# Patient Record
Sex: Female | Born: 1990
Health system: Southern US, Community
[De-identification: ages and names within clinical notes are randomized; demographics above are authoritative.]

## PROBLEM LIST (undated history)

## (undated) ENCOUNTER — Inpatient Hospital Stay (HOSPITAL_COMMUNITY): Payer: Self-pay

## (undated) DIAGNOSIS — E739 Lactose intolerance, unspecified: Secondary | ICD-10-CM

## (undated) DIAGNOSIS — O43129 Velamentous insertion of umbilical cord, unspecified trimester: Secondary | ICD-10-CM

## (undated) DIAGNOSIS — Z789 Other specified health status: Secondary | ICD-10-CM

## (undated) DIAGNOSIS — L309 Dermatitis, unspecified: Secondary | ICD-10-CM

## (undated) DIAGNOSIS — T7840XA Allergy, unspecified, initial encounter: Secondary | ICD-10-CM

## (undated) DIAGNOSIS — E559 Vitamin D deficiency, unspecified: Secondary | ICD-10-CM

## (undated) DIAGNOSIS — M549 Dorsalgia, unspecified: Secondary | ICD-10-CM

## (undated) DIAGNOSIS — A609 Anogenital herpesviral infection, unspecified: Secondary | ICD-10-CM

## (undated) DIAGNOSIS — E785 Hyperlipidemia, unspecified: Secondary | ICD-10-CM

## (undated) DIAGNOSIS — F419 Anxiety disorder, unspecified: Secondary | ICD-10-CM

## (undated) DIAGNOSIS — F32A Depression, unspecified: Secondary | ICD-10-CM

## (undated) HISTORY — DX: Anxiety disorder, unspecified: F41.9

## (undated) HISTORY — DX: Allergy, unspecified, initial encounter: T78.40XA

## (undated) HISTORY — DX: Dorsalgia, unspecified: M54.9

## (undated) HISTORY — DX: Hyperlipidemia, unspecified: E78.5

## (undated) HISTORY — DX: Vitamin D deficiency, unspecified: E55.9

## (undated) HISTORY — DX: Dermatitis, unspecified: L30.9

## (undated) HISTORY — DX: Lactose intolerance, unspecified: E73.9

## (undated) HISTORY — DX: Depression, unspecified: F32.A

## (undated) HISTORY — DX: Anogenital herpesviral infection, unspecified: A60.9

## (undated) HISTORY — PX: NO PAST SURGERIES: SHX2092

## (undated) HISTORY — DX: Velamentous insertion of umbilical cord, unspecified trimester: O43.129

---

## 2011-09-12 ENCOUNTER — Ambulatory Visit: Payer: Self-pay | Admitting: Internal Medicine

## 2011-09-12 VITALS — BP 110/75 | HR 80 | Temp 98.4°F | Resp 16 | Ht 65.5 in | Wt 190.4 lb

## 2011-09-12 DIAGNOSIS — A4902 Methicillin resistant Staphylococcus aureus infection, unspecified site: Secondary | ICD-10-CM

## 2011-09-12 MED ORDER — SULFAMETHOXAZOLE-TRIMETHOPRIM 800-160 MG PO TABS: 1.0000 | ORAL_TABLET | Freq: Two times a day (BID) | ORAL | Status: AC

## 2011-09-12 MED ORDER — DOXYCYCLINE HYCLATE 100 MG PO TABS: 100.0000 mg | ORAL_TABLET | Freq: Two times a day (BID) | ORAL | Status: AC

## 2011-09-13 NOTE — Progress Notes (Signed)
  Subjective:    Patient ID: Brooke Casey, female    DOB: [birth year], 21 y.o.   MRN: [id number]  HPIPresents with a sore swollen red area over the right shin which is developed over the past 2 weeks She has a history of MRSA in the past and has had recent active lesions in the pubic mound after shaving. There is one lesion near nail and a small swollen lymph node in the right groin She has had lesions which come and go steadily over the last 6 months    Review of Systems     Objective:   Physical Exam 7 cm x 4 cm abscess on the right anterior shin that is freely draining and copious pus is expressed Small 0.5 cm pustule right part of the pubic mound       Assessment & Plan:  Problem #1 MRSA Septra DS twice a day for 10 days Doxycycline 100 twice a day for 30 days Scrub lesions 3 times a day Hot compresses to right shin 3 times a day for 20 minutes Followup in 4 days if not making progress Culture not done because of past positives which showed susceptibility these drugs and because the patient has no insurance

## 2012-02-10 ENCOUNTER — Encounter (HOSPITAL_COMMUNITY): Payer: Self-pay | Admitting: Emergency Medicine

## 2012-02-10 ENCOUNTER — Emergency Department (INDEPENDENT_AMBULATORY_CARE_PROVIDER_SITE_OTHER)
Admission: EM | Admit: 2012-02-10 | Discharge: 2012-02-10 | Disposition: A | Discharge status: Home / Self Care | Payer: Self-pay | Source: Home / Self Care | Attending: Family Medicine | Admitting: Family Medicine

## 2012-02-10 DIAGNOSIS — L03019 Cellulitis of unspecified finger: Secondary | ICD-10-CM

## 2012-02-10 DIAGNOSIS — IMO0001 Reserved for inherently not codable concepts without codable children: Secondary | ICD-10-CM

## 2012-02-10 MED ORDER — DOXYCYCLINE HYCLATE 100 MG PO CAPS: 100.0000 mg | ORAL_CAPSULE | Freq: Two times a day (BID) | ORAL | Status: AC

## 2012-02-10 NOTE — ED Notes (Signed)
HERE WITH SWELLING AND LITTLE PUS DRAINAGE TO RIGHT MIDDLE NAILBED AREA AFTER GETTING NAILS DONE LAST WEEK.SOAKED IN PEROXIDE BUT C/O THROB PAIN IF BUMPED

## 2012-02-10 NOTE — ED Provider Notes (Signed)
History     CSN: [id number]  Arrival date & time 02/10/12  1839   First MD Initiated Contact with Patient 02/10/12 1901      Chief Complaint  Patient presents with  . Nail Problem    (Consider location/radiation/quality/duration/timing/severity/associated sxs/prior treatment) Patient is a 21 y.o. female presenting with hand pain. The history is provided by the patient.  Hand Pain This is a new problem. The current episode started more than 1 week ago. The problem has not changed since onset.Associated symptoms comments: Pain and cuticle sts since having nail treatment.    History reviewed. No pertinent past medical history.  History reviewed. No pertinent past surgical history.  No family history on file.  History  Substance Use Topics  . Smoking status: Never Smoker   . Smokeless tobacco: Not on file  . Alcohol Use: No    OB History    Grav Para Term Preterm Abortions TAB SAB Ect Mult Living                  Review of Systems  Constitutional: Negative.   Skin: Positive for wound.    Allergies  Review of patient's allergies indicates no known allergies.  Home Medications   Current Outpatient Rx  Name Route Sig Dispense Refill  . DOXYCYCLINE HYCLATE 100 MG PO CAPS Oral Take 1 capsule (100 mg total) by mouth 2 (two) times daily. 20 capsule 0    BP 126/72  Pulse 80  Temp 98.4 F (36.9 C) (Oral)  Resp 16  SpO2 100%  LMP 02/03/2012  Physical Exam  Nursing note and vitals reviewed. Constitutional: She appears well-developed and well-nourished.  Musculoskeletal: She exhibits tenderness.       Arms: Skin: Skin is warm and dry.    ED Course  Procedures (including critical care time)  Labs Reviewed - No data to display No results found.   1. Paronychia of third finger, right       MDM         Linna Hoff, MD 02/10/12 2008

## 2012-07-26 ENCOUNTER — Other Ambulatory Visit: Payer: Self-pay | Admitting: Radiology

## 2012-07-26 ENCOUNTER — Ambulatory Visit: Payer: Self-pay | Admitting: Physician Assistant

## 2012-07-26 VITALS — BP 101/68 | HR 85 | Temp 99.5°F | Resp 16 | Ht 65.0 in | Wt 184.0 lb

## 2012-07-26 DIAGNOSIS — J029 Acute pharyngitis, unspecified: Secondary | ICD-10-CM

## 2012-07-26 MED ORDER — AMOXICILLIN 875 MG PO TABS: 875.0000 mg | ORAL_TABLET | Freq: Two times a day (BID) | ORAL | Status: DC

## 2012-07-26 NOTE — Progress Notes (Signed)
  Subjective:    Patient ID: Brooke Casey, female    DOB: 10/06/1990, 22 y.o.   MRN: [id number]  HPI 22 year old female presents with 4 day history of progressively worsening sore throat.  Admits to slight dry cough.  States it is painful to swallow but she is able to do so.  Denies nasal congestion, otalgia, postnasal drainage, headache, dizziness, nausea, vomiting, or abdominal pain. She is otherwise healthy without any other concerns today. Does have a history of a similar illness last year about this time. No known strep contacts.  She has been taking ibuprofen which has helped some with the discomfort.     Review of Systems  Constitutional: Negative for fever and chills.  HENT: Positive for sore throat. Negative for congestion, rhinorrhea, trouble swallowing, neck pain, postnasal drip and sinus pressure.   Respiratory: Negative for cough.   Gastrointestinal: Negative for nausea and vomiting.  Neurological: Negative for dizziness, light-headedness and headaches.       Objective:   Physical Exam  Vitals reviewed. Constitutional: She is oriented to person, place, and time. She appears well-developed and well-nourished.  HENT:  Head: Normocephalic and atraumatic.  Right Ear: Hearing, tympanic membrane, external ear and ear canal normal.  Left Ear: Hearing, tympanic membrane, external ear and ear canal normal.  Mouth/Throat: Uvula is midline and mucous membranes are normal. Oropharyngeal exudate and posterior oropharyngeal erythema (3+ tonsillar swelling) present. No posterior oropharyngeal edema or tonsillar abscesses.  Eyes: Conjunctivae are normal.  Neck: Normal range of motion. Neck supple.  Cardiovascular: Normal rate, regular rhythm and normal heart sounds.   Pulmonary/Chest: Effort normal and breath sounds normal.  Lymphadenopathy:    Cervical adenopathy: AC.  Neurological: She is alert and oriented to person, place, and time.  Psychiatric: She has a normal mood and affect. Her  behavior is normal. Judgment and thought content normal.          Assessment & Plan:  Acute pharyngitis - Plan: amoxicillin (AMOXIL) 875 MG tablet  Will go ahead and cover strep with amoxicillin 875 mg bid x 10 days Patient understands that if symptoms persist, she may need to return for labwork to rule out possibility of mono Increase fluids and rest.  Continue ibuprofen tid prn pain/fever.

## 2012-07-26 NOTE — Telephone Encounter (Signed)
Spoke to pharmacy to advise per Rhoderick Moody, change Amox to 500mg  bid capsules, this will save patient large amt of money at [place].

## 2013-01-18 ENCOUNTER — Ambulatory Visit: Payer: Self-pay | Admitting: Physician Assistant

## 2013-01-18 VITALS — BP 108/66 | HR 120 | Temp 102.6°F | Resp 19 | Ht 65.5 in | Wt 192.0 lb

## 2013-01-18 DIAGNOSIS — J029 Acute pharyngitis, unspecified: Secondary | ICD-10-CM

## 2013-01-18 LAB — POCT RAPID STREP A (OFFICE): Rapid Strep A Screen: NEGATIVE

## 2013-01-18 MED ORDER — AMOXICILLIN 875 MG PO TABS: 875.0000 mg | ORAL_TABLET | Freq: Two times a day (BID) | ORAL | Status: DC

## 2013-01-18 MED ORDER — IBUPROFEN 200 MG PO TABS
800.0000 mg | ORAL_TABLET | Freq: Once | ORAL | Status: AC
  Administered 2013-01-18: 800 mg via ORAL

## 2013-01-18 NOTE — Progress Notes (Signed)
  Subjective:    Patient ID: Brooke Casey, female    DOB: [birth year], 22 y.o.   MRN: [id number]  HPI 22 year old female presents with acute onset of sore throat. Symptoms started 4 days ago and have progressively worsened.  Developed fever and body aches yesterday.  Has decreased appetite but no nausea or vomiting.  Admits to bilateral ear fullness and headache.  No dizziness, cough, nasal congestion, or sinus pressure.  She took ibuprofen yesterday but has not had any other medications since.  She is healthy with no known medical problems. No known strep contacts or significant hx of strep.   Patient is otherwise doing well with no other concerns today.     Review of Systems  Constitutional: Positive for fever and chills.  HENT: Positive for sore throat. Negative for ear pain, congestion, rhinorrhea and postnasal drip.   Respiratory: Negative for cough.   Gastrointestinal: Negative for nausea, vomiting and abdominal pain.  Neurological: Positive for headaches. Negative for dizziness.       Objective:   Physical Exam  Constitutional: She is oriented to person, place, and time. She appears well-developed and well-nourished.  HENT:  Head: Normocephalic and atraumatic.  Right Ear: Hearing, tympanic membrane, external ear and ear canal normal.  Left Ear: Hearing, tympanic membrane, external ear and ear canal normal.  Mouth/Throat: Uvula is midline and mucous membranes are normal. Oropharyngeal exudate (2+ tonsillar swelling) and posterior oropharyngeal erythema present. No posterior oropharyngeal edema or tonsillar abscesses.  Eyes: Conjunctivae are normal.  Neck: Normal range of motion.  Cardiovascular: Normal rate.   Pulmonary/Chest: Effort normal.  Neurological: She is alert and oriented to person, place, and time.  Psychiatric: She has a normal mood and affect. Her behavior is normal. Judgment and thought content normal.          Assessment & Plan:  Acute pharyngitis - Plan:  POCT rapid strep A  Patient declined throat culture and bloodwork due to cost Will go ahead and start amoxicillin 875 mg bid x 10 days Continue ibuprofen and tylenol as needed for fever and pain Increase fluids and rest Out of work until 01/20/13 Follow up if symptoms worsen or fail to improve.

## 2013-01-21 ENCOUNTER — Telehealth: Payer: Self-pay

## 2013-01-21 NOTE — Telephone Encounter (Signed)
Pt states that she seems to be getting worse, pt still c/o swollen tonsils, body aches, and fever that reached 104 yesterday but has went down today. [phone]

## 2013-01-22 NOTE — Telephone Encounter (Signed)
Patient advised to return to clinic. She should continue the ibuprofen.

## 2013-01-22 NOTE — Telephone Encounter (Signed)
Patient declined throat culture and bloodwork due to cost  Will go ahead and start amoxicillin 875 mg bid x 10 days  Continue ibuprofen and tylenol as needed for fever and pain  Increase fluids and rest  Out of work until 01/20/13  Follow up if symptoms worsen or fail to improve.   Pt should return to clinic for additional labs. Left message for her to call me back so I can advise.

## 2015-01-13 ENCOUNTER — Ambulatory Visit (INDEPENDENT_AMBULATORY_CARE_PROVIDER_SITE_OTHER): Payer: Self-pay | Admitting: Emergency Medicine

## 2015-01-13 ENCOUNTER — Ambulatory Visit (INDEPENDENT_AMBULATORY_CARE_PROVIDER_SITE_OTHER): Payer: Self-pay

## 2015-01-13 VITALS — BP 116/70 | HR 84 | Temp 98.5°F | Resp 16 | Ht 64.0 in | Wt 205.0 lb

## 2015-01-13 DIAGNOSIS — M7989 Other specified soft tissue disorders: Secondary | ICD-10-CM

## 2015-01-13 DIAGNOSIS — S61219A Laceration without foreign body of unspecified finger without damage to nail, initial encounter: Secondary | ICD-10-CM

## 2015-01-13 DIAGNOSIS — M79645 Pain in left finger(s): Secondary | ICD-10-CM

## 2015-01-13 DIAGNOSIS — S62639A Displaced fracture of distal phalanx of unspecified finger, initial encounter for closed fracture: Secondary | ICD-10-CM | POA: Insufficient documentation

## 2015-01-13 MED ORDER — NAPROXEN SODIUM 550 MG PO TABS: 550.0000 mg | ORAL_TABLET | Freq: Two times a day (BID) | ORAL | Status: DC

## 2015-01-13 MED ORDER — DOXYCYCLINE HYCLATE 100 MG PO CAPS: 100.0000 mg | ORAL_CAPSULE | Freq: Two times a day (BID) | ORAL | Status: DC

## 2015-01-13 NOTE — Patient Instructions (Signed)

## 2015-01-14 NOTE — Progress Notes (Signed)
  Medical screening examination/treatment/procedure(s) were performed by non-physician practitioner and as supervising physician I was immediately available for consultation/collaboration.     

## 2015-01-14 NOTE — Progress Notes (Signed)
    MRN: [id number] DOB: 24-Feb-1991  Subjective:   Brooke Casey is a 24 y.o. female presenting for chief complaint of wound care  Reports 10 day history of right middle finger laceration. Patient was on a cruise ship and suffered an injury with a steel door closing on her finger. She was treated with suture repair, started on amoxicillin, TDAP updated by medical team on the cruise ship. Today, she reports ongoing right middle finger pain and swelling, drainage of pus from her wound, numbness of her finger. 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 Denies any other aggravating or relieving factors, no other questions or concerns.  Brooke Casey is currently taking hydrocodone and amoxicillin. Also has No Known Allergies.  Brooke Casey  has no past medical history on file. Also  has no past surgical history on file.  Objective:   Vitals: BP 116/70 mmHg  Pulse 84  Temp(Src) 98.5 F (36.9 C) (Oral)  Resp 16  Ht  (1.626 m)  Wt 205 lb (92.987 kg)  BMI 35.17 kg/m2  SpO2 98%  LMP 12/18/2014  Physical Exam  Constitutional: She is oriented to person, place, and time. She appears well-developed and well-nourished.  Cardiovascular: Normal rate.   Pulmonary/Chest: Effort normal.  Musculoskeletal:       Right hand: She exhibits bony tenderness, laceration and swelling. She exhibits normal range of motion, normal capillary refill and no deformity. Decreased sensation noted. Decreased strength (seoncdary to pain per patient) noted. She exhibits no finger abduction, no thumb/finger opposition and no wrist extension trouble.       Hands: Neurological: She is alert and oriented to person, place, and time.  Skin: Skin is warm and dry.   [facility] reading (PRIMARY) by  Dr. Dareen Piano and PA-Keali Mccraw. Right middle finger - displaced distal phanlyx fracture.  PROCEDURE - Sutures removed without incident. Betadine used to clean wound. Dressing applied, distal phanlynx protected  with finger splint and secured with coband. Patient tolerated this well.  Assessment and Plan :   1. Finger laceration, initial encounter 2. Finger swelling 3. Pain of finger of left hand 4. Closed fracture of distal phalanx or phalanges of hand, initial encounter - Stop amoxicillin, start doxycycline for better coverage of infectious process. - Finger splint applied, limit use of right hand, start Anaprox for pain and inflammation and use hydrocodone for break through pain - rtc in 2 weeks.  Wallis Bamberg, PA-C [facility] [phone] 01/14/2015 1:07 PM

## 2015-08-04 ENCOUNTER — Ambulatory Visit (INDEPENDENT_AMBULATORY_CARE_PROVIDER_SITE_OTHER): Payer: BLUE CROSS/BLUE SHIELD | Admitting: Internal Medicine

## 2015-08-04 VITALS — BP 130/80 | HR 83 | Temp 99.5°F | Resp 20 | Ht 65.0 in | Wt 201.6 lb

## 2015-08-04 DIAGNOSIS — J029 Acute pharyngitis, unspecified: Secondary | ICD-10-CM

## 2015-08-04 LAB — POCT RAPID STREP A (OFFICE): RAPID STREP A SCREEN: NEGATIVE

## 2015-08-04 LAB — POCT CBC
Granulocyte percent: 69.6 %G (ref 37–80)
HEMATOCRIT: 35.6 % — AB (ref 37.7–47.9)
HEMOGLOBIN: 12.6 g/dL (ref 12.2–16.2)
LYMPH, POC: 1.6 (ref 0.6–3.4)
MCH, POC: 31.8 pg — AB (ref 27–31.2)
MCHC: 35.4 g/dL (ref 31.8–35.4)
MCV: 89.9 fL (ref 80–97)
MID (CBC): 0.5 (ref 0–0.9)
MPV: 6.8 fL (ref 0–99.8)
POC Granulocyte: 4.9 (ref 2–6.9)
POC LYMPH PERCENT: 22.7 %L (ref 10–50)
POC MID %: 7.7 % (ref 0–12)
Platelet Count, POC: 241 10*3/uL (ref 142–424)
RBC: 3.96 M/uL — AB (ref 4.04–5.48)
RDW, POC: 13.3 %
WBC: 7.1 10*3/uL (ref 4.6–10.2)

## 2015-08-04 MED ORDER — FLUOCINONIDE 0.05 % EX OINT: 1.0000 "application " | TOPICAL_OINTMENT | Freq: Two times a day (BID) | CUTANEOUS | Status: DC

## 2015-08-04 NOTE — Patient Instructions (Signed)

## 2015-08-04 NOTE — Progress Notes (Signed)
Subjective:  By signing my name below, I, Stann Oresung-Kai Tsai, attest that this documentation has been prepared under the direction and in the presence of Ellamae Siaobert Tunis Gentle, MD. Electronically Signed: Stann Oresung-Kai Tsai, Scribe. 08/04/2015 , 2:32 PM .  Patient was seen in Room 12 .   Patient ID: Sande Brothersaima Nolt, female    DOB: 01/14/1991, 25 y.o.   MRN: [id number] Chief Complaint  Patient presents with  . Allergic Reaction    lip swelling   HPI Kalayla Emelia LoronBlakely is a 25 y.o. female who [facility] of possible allergic reaction causing her bottom lip to swell that was noticed this morning. She reports that the bottom lip area is itchy and has some trouble swallowing. She denies tongue swelling.   She also notes that she started coughing with fever and fatigue 4 days ago. She denies any wheezing or waking up at night due to coughs. She recently took doxycycline leftover from previous illness.   Patient Active Problem List   Diagnosis Date Noted  . Closed fracture of distal phalanx or phalanges of hand 01/13/2015  . Pain of finger of left hand 01/13/2015  . Finger swelling 01/13/2015    Current outpatient prescriptions:  .  doxycycline (VIBRAMYCIN) 100 MG capsule, Take 1 capsule (100 mg total) by mouth 2 (two) times daily. (Patient not taking: Reported on 08/04/2015), Disp: 20 capsule, Rfl: 0 .  naproxen sodium (ANAPROX DS) 550 MG tablet, Take 1 tablet (550 mg total) by mouth 2 (two) times daily with a meal. (Patient not taking: Reported on 08/04/2015), Disp: 30 tablet, Rfl: 0  Review of Systems  Constitutional: Positive for fever and fatigue. Negative for activity change and appetite change.  HENT: Positive for facial swelling and trouble swallowing. Negative for congestion and sore throat.   Respiratory: Positive for cough. Negative for shortness of breath and wheezing.   Gastrointestinal: Negative for nausea, vomiting and diarrhea.  Skin: Positive for rash.       Objective:   Physical Exam  Constitutional: She is oriented to person, place, and time. She appears well-developed and well-nourished. No distress.  HENT:  Head: Normocephalic and atraumatic.  Nose: Nose normal.  Throat red with scant exudate Lips are mildly swollen with very fine raised red rash  Eyes: Conjunctivae and EOM are normal. Pupils are equal, round, and reactive to light.  Neck: Neck supple.  Cardiovascular: Normal rate.   Pulmonary/Chest: Effort normal and breath sounds normal. No respiratory distress. She has no wheezes.  Musculoskeletal: Normal range of motion.  Lymphadenopathy:    She has cervical adenopathy (anterior cervical 1+ and tender).  Neurological: She is alert and oriented to person, place, and time.  Skin: Skin is warm and dry.  Psychiatric: She has a normal mood and affect. Her behavior is normal.  Nursing note and vitals reviewed.   BP 130/80 mmHg  Pulse 83  Temp(Src) 99.5 F (37.5 C) (Oral)  Resp 20  Ht 5\' 5"  (1.651 m)  Wt 201 lb 9.6 oz (91.445 kg)  BMI 33.55 kg/m2  SpO2 98%  LMP 07/09/2015   Results for orders placed or performed in visit on 08/04/15  POCT rapid strep A  Result Value Ref Range   Rapid Strep A Screen Negative Negative       Assessment & Plan:  Sore throat - Plan: POCT rapid strep A, Culture, Group A Strep, POCT CBC lip rash Meds ordered this encounter  Medications  . fluocinonide ointment (LIDEX) 0.05 %    Sig:  Apply 1 application topically 2 (two) times daily.    Dispense:  15 g    Refill:  0   F/u 3-5d if not better Call cult I have completed the patient encounter in its entirety as documented by the scribe, with editing by me where necessary. Jeanmarie Mccowen P. Merla Riches, M.D.

## 2015-08-07 LAB — CULTURE, GROUP A STREP: ORGANISM ID, BACTERIA: NORMAL

## 2016-06-02 LAB — OB RESULTS CONSOLE GC/CHLAMYDIA
Chlamydia: NEGATIVE
GC PROBE AMP, GENITAL: NEGATIVE

## 2016-06-02 LAB — OB RESULTS CONSOLE RUBELLA ANTIBODY, IGM: Rubella: IMMUNE

## 2016-06-02 LAB — OB RESULTS CONSOLE ABO/RH: RH Type: POSITIVE

## 2016-06-02 LAB — OB RESULTS CONSOLE HIV ANTIBODY (ROUTINE TESTING): HIV: NONREACTIVE

## 2016-06-02 LAB — OB RESULTS CONSOLE RPR: RPR: NONREACTIVE

## 2016-06-02 LAB — OB RESULTS CONSOLE HEPATITIS B SURFACE ANTIGEN: Hepatitis B Surface Ag: NEGATIVE

## 2016-10-29 ENCOUNTER — Observation Stay (HOSPITAL_COMMUNITY)
Admission: AD | Admit: 2016-10-29 | Discharge: 2016-10-29 | Disposition: A | Discharge status: Home / Self Care | Payer: BLUE CROSS/BLUE SHIELD | Source: Ambulatory Visit | Attending: Obstetrics and Gynecology | Admitting: Obstetrics and Gynecology

## 2016-10-29 ENCOUNTER — Encounter (HOSPITAL_COMMUNITY): Payer: Self-pay | Admitting: *Deleted

## 2016-10-29 DIAGNOSIS — Z3A28 28 weeks gestation of pregnancy: Secondary | ICD-10-CM | POA: Insufficient documentation

## 2016-10-29 DIAGNOSIS — Z79899 Other long term (current) drug therapy: Secondary | ICD-10-CM | POA: Insufficient documentation

## 2016-10-29 DIAGNOSIS — R197 Diarrhea, unspecified: Secondary | ICD-10-CM | POA: Insufficient documentation

## 2016-10-29 DIAGNOSIS — O9989 Other specified diseases and conditions complicating pregnancy, childbirth and the puerperium: Principal | ICD-10-CM | POA: Insufficient documentation

## 2016-10-29 DIAGNOSIS — R509 Fever, unspecified: Secondary | ICD-10-CM | POA: Diagnosis present

## 2016-10-29 LAB — ABO/RH: ABO/RH(D): A POS

## 2016-10-29 LAB — URINALYSIS, ROUTINE W REFLEX MICROSCOPIC
BILIRUBIN URINE: NEGATIVE
GLUCOSE, UA: NEGATIVE mg/dL
Hgb urine dipstick: NEGATIVE
KETONES UR: 20 mg/dL — AB
Leukocytes, UA: NEGATIVE
NITRITE: NEGATIVE
PH: 6 (ref 5.0–8.0)
PROTEIN: NEGATIVE mg/dL
Specific Gravity, Urine: 1.015 (ref 1.005–1.030)

## 2016-10-29 LAB — CBC
HEMATOCRIT: 33.5 % — AB (ref 36.0–46.0)
Hemoglobin: 12.1 g/dL (ref 12.0–15.0)
MCH: 32.7 pg (ref 26.0–34.0)
MCHC: 36.1 g/dL — AB (ref 30.0–36.0)
MCV: 90.5 fL (ref 78.0–100.0)
PLATELETS: 255 10*3/uL (ref 150–400)
RBC: 3.7 MIL/uL — ABNORMAL LOW (ref 3.87–5.11)
RDW: 12.7 % (ref 11.5–15.5)
WBC: 11.2 10*3/uL — ABNORMAL HIGH (ref 4.0–10.5)

## 2016-10-29 LAB — COMPREHENSIVE METABOLIC PANEL
ALT: 26 U/L (ref 14–54)
ANION GAP: 8 (ref 5–15)
AST: 30 U/L (ref 15–41)
Albumin: 3 g/dL — ABNORMAL LOW (ref 3.5–5.0)
Alkaline Phosphatase: 80 U/L (ref 38–126)
BILIRUBIN TOTAL: 0.7 mg/dL (ref 0.3–1.2)
BUN: <5 mg/dL — ABNORMAL LOW (ref 6–20)
CHLORIDE: 104 mmol/L (ref 101–111)
CO2: 19 mmol/L — ABNORMAL LOW (ref 22–32)
Calcium: 8.9 mg/dL (ref 8.9–10.3)
Creatinine, Ser: 0.54 mg/dL (ref 0.44–1.00)
Glucose, Bld: 82 mg/dL (ref 65–99)
POTASSIUM: 3.8 mmol/L (ref 3.5–5.1)
Sodium: 131 mmol/L — ABNORMAL LOW (ref 135–145)
TOTAL PROTEIN: 7 g/dL (ref 6.5–8.1)

## 2016-10-29 LAB — TYPE AND SCREEN
ABO/RH(D): A POS
Antibody Screen: NEGATIVE

## 2016-10-29 LAB — INFLUENZA PANEL BY PCR (TYPE A & B)
Influenza A By PCR: NEGATIVE
Influenza B By PCR: NEGATIVE

## 2016-10-29 MED ORDER — OSELTAMIVIR PHOSPHATE 75 MG PO CAPS
75.0000 mg | ORAL_CAPSULE | Freq: Two times a day (BID) | ORAL | Status: DC
  Administered 2016-10-29: 75 mg via ORAL
  Filled 2016-10-29 (×3): qty 1

## 2016-10-29 MED ORDER — LACTATED RINGERS IV SOLN: INTRAVENOUS | Status: DC

## 2016-10-29 MED ORDER — ZOLPIDEM TARTRATE 5 MG PO TABS
5.0000 mg | ORAL_TABLET | Freq: Every evening | ORAL | Status: DC | PRN
Start: 2016-10-29 — End: 2016-10-29

## 2016-10-29 MED ORDER — PRENATAL MULTIVITAMIN CH
1.0000 | ORAL_TABLET | Freq: Every day | ORAL | Status: DC
  Administered 2016-10-29: 1 via ORAL
  Filled 2016-10-29 (×2): qty 1

## 2016-10-29 MED ORDER — ACETAMINOPHEN 500 MG PO TABS
1000.0000 mg | ORAL_TABLET | Freq: Once | ORAL | Status: AC
  Administered 2016-10-29: 1000 mg via ORAL
  Filled 2016-10-29: qty 2

## 2016-10-29 MED ORDER — LACTATED RINGERS IV SOLN
INTRAVENOUS | Status: DC
  Administered 2016-10-29: 100 mL via INTRAVENOUS

## 2016-10-29 MED ORDER — LOPERAMIDE HCL 2 MG PO CAPS
2.0000 mg | ORAL_CAPSULE | ORAL | Status: DC | PRN
  Filled 2016-10-29: qty 1

## 2016-10-29 MED ORDER — ACETAMINOPHEN 325 MG PO TABS: 650.0000 mg | ORAL_TABLET | ORAL | Status: DC | PRN

## 2016-10-29 MED ORDER — ACETAMINOPHEN 325 MG PO TABS: 650.0000 mg | ORAL_TABLET | Freq: Four times a day (QID) | ORAL | Status: DC | PRN

## 2016-10-29 MED ORDER — LOPERAMIDE HCL 2 MG PO CAPS
4.0000 mg | ORAL_CAPSULE | Freq: Once | ORAL | Status: AC
  Administered 2016-10-29: 4 mg via ORAL
  Filled 2016-10-29: qty 2

## 2016-10-29 MED ORDER — LACTATED RINGERS IV BOLUS (SEPSIS)
500.0000 mL | Freq: Once | INTRAVENOUS | Status: AC
  Administered 2016-10-29: 500 mL via INTRAVENOUS

## 2016-10-29 MED ORDER — DOCUSATE SODIUM 100 MG PO CAPS
100.0000 mg | ORAL_CAPSULE | Freq: Every day | ORAL | Status: DC
  Administered 2016-10-29: 100 mg via ORAL
  Filled 2016-10-29 (×2): qty 1

## 2016-10-29 MED ORDER — CALCIUM CARBONATE ANTACID 500 MG PO CHEW: 2.0000 | CHEWABLE_TABLET | ORAL | Status: DC | PRN

## 2016-10-29 NOTE — MAU Note (Signed)
PT  SAYS   THIS  AM  SHE  STARTED  H/A, FEVER- AT  HOME  103.9.  BODY ACHES,  DIARRHEA-     CALLED  OFFICE - TOLD  TO COME  IN.

## 2016-10-29 NOTE — H&P (Signed)
Brooke Casey is a 26 y.o. female, G1P0 at 5428 weeks, presenting for fever and diarrhea.  Patient pregnancy history significant for HSV diagnosis by titier, Obesity, and Velamentous/marginal cord insertion.  Patient also has shellfish allergy.    Patient Active Problem List   Diagnosis Date Noted  . Closed fracture of distal phalanx or phalanges of hand 01/13/2015  . Pain of finger of left hand 01/13/2015  . Finger swelling 01/13/2015    History of present pregnancy: Patient entered care at 6.5 weeks.   EDC of 01/21/2017 was established by Definite LMP.   Anatomy scan:  19.6 weeks, with normal findings and an posterior placenta.   Additional US evaluations:  27wks for Growth  Last evaluation:  10/27/2016 in office by [facility], CNM  Breech Presentation, BP 106/58, Wt 224lbs  OB History    Gravida Para Term Preterm AB Living   1             SAB TAB Ectopic Multiple Live Births                 History reviewed. No pertinent past medical history. History reviewed. No pertinent surgical history. Family History: family history is not on file. Social History:  reports that she has never smoked. She does not have any smokeless tobacco history on file. She reports that she does not drink alcohol or use drugs.   Prenatal Transfer Tool  Maternal Diabetes: No Genetic Screening: Normal Maternal Ultrasounds/Referrals: Abnormal:  Findings:   Other:Marginal and Velamentous Insertion Fetal Ultrasounds or other Referrals:  None Maternal Substance Abuse:  No Significant Maternal Medications:  None Significant Maternal Lab Results: None    ROS: Constitutional: Positive for chills, fever and malaise/fatigue.  HENT: Negative for congestion and sinus pain.   Respiratory: Negative for cough, sputum production and shortness of breath.   Gastrointestinal: Positive for diarrhea and nausea. Negative for abdominal pain and vomiting.  Musculoskeletal: Positive for myalgias.  Neurological: Positive for  weakness and headaches.    No Known Allergies     Blood pressure 113/69, pulse 99, temperature 98.9 F (37.2 C), temperature source Oral, resp. rate 18, height 5\' 4"  (1.626 m), weight 101.3 kg (223 lb 4 oz).  Physical Exam Constitutional: She is oriented to person, place, and time. She appears well-developed and well-nourished. No distress.  HENT:  Head: Normocephalic and atraumatic.  Eyes: Conjunctivae are normal.  Neck: Normal range of motion.  Cardiovascular: Normal rate, regular rhythm and normal heart sounds.   Respiratory: Effort normal and breath sounds normal.  GI: Soft. Bowel sounds are normal.  Gravid--fundal height appears AGA, Soft, NT  Musculoskeletal: Normal range of motion. She exhibits edema.  Neurological: She is alert and oriented to person, place, and time.  Skin: Skin is warm and dry.    FHR:150 bpm, Mod Var, -Decels, +Accels UC: None graphed or palpated   Prenatal labs: ABO, Rh: --/--/A POS, A POS (06/01 0130) Antibody: NEG (06/01 0130) Rubella:  Immune RPR:  NR  HBsAg:   Negative HIV:   NR GBS:  Unknown Sickle cell/Hgb electrophoresis:  Normal Pap:  Normal GC:  ND Chlamydia:  ND Other:  None    Assessment IUP at 28wks Cat I FT Fever of Unknown Origin Diarrhea  Plan: Admit to Antepartum for observation per consult with Dr.E. Sallye OberKulwa Routine Antepartum Orders per [facility] Guideline *Temperature Q 2 HRs *Blood Cultures *Stool Cultures *Start Tamiflu for suspected influenza despite negative labs Plan for discharge if labs normal and patient remains  afebrile Okay for regular diet  Joellyn Quails, MSN 10/29/2016, 3:07 AM

## 2016-10-29 NOTE — Progress Notes (Signed)
Hospital day # 0 pregnancy at 6736w0d--Fever, diarrhea.  S:  Feeling slightly better this am, "very tired".  Still had some diarrhea during night, but denies vomiting or nausea.  Felt like she had some chills during night.  Denies abdominal pain, other than cramping when diarrhea occurs.  Reports +FM.      Perception of contractions: None      Vaginal bleeding:  None      Vaginal discharge:  None  O: BP 113/64 (BP Location: Left Arm)   Pulse 94   Temp 98.7 F (37.1 C) (Oral)   Resp 18   Ht 5\' 4"  (1.626 m)   Wt 101.3 kg (223 lb 4 oz)   SpO2 95%   BMI 38.32 kg/m    Temp max 100.4 at time of admission.  Vitals:   10/29/16 0252 10/29/16 0353 10/29/16 0407 10/29/16 0549  BP: 113/69 113/64    Pulse: 99 94    Resp: 18 18    Temp: 98.9 F (37.2 C) 98.8 F (37.1 C)  98.7 F (37.1 C)  TempSrc: Oral Oral  Oral  SpO2:   95%   Weight:      Height:            Fetal tracings:  Reactive NST on admission      Contractions:   None       Chest clear      Abd soft, NT, + bowel sounds      Uterus gravid and non-tender      Extremities: no significant edema and no signs of DVT          Labs:   Results for orders placed or performed during the hospital encounter of 10/29/16 (from the past 24 hour(s))  Urinalysis, Routine w reflex microscopic     Status: Abnormal   Collection Time: 10/29/16 12:49 AM  Result Value Ref Range   Color, Urine YELLOW YELLOW   APPearance HAZY (A) CLEAR   Specific Gravity, Urine 1.015 1.005 - 1.030   pH 6.0 5.0 - 8.0   Glucose, UA NEGATIVE NEGATIVE mg/dL   Hgb urine dipstick NEGATIVE NEGATIVE   Bilirubin Urine NEGATIVE NEGATIVE   Ketones, ur 20 (A) NEGATIVE mg/dL   Protein, ur NEGATIVE NEGATIVE mg/dL   Nitrite NEGATIVE NEGATIVE   Leukocytes, UA NEGATIVE NEGATIVE  Influenza panel by PCR (type A & B)     Status: None   Collection Time: 10/29/16  1:25 AM  Result Value Ref Range   Influenza A By PCR NEGATIVE NEGATIVE   Influenza B By PCR NEGATIVE NEGATIVE   CBC     Status: Abnormal   Collection Time: 10/29/16  1:30 AM  Result Value Ref Range   WBC 11.2 (H) 4.0 - 10.5 K/uL   RBC 3.70 (L) 3.87 - 5.11 MIL/uL   Hemoglobin 12.1 12.0 - 15.0 g/dL   HCT 86.533.5 (L) 78.436.0 - 69.646.0 %   MCV 90.5 78.0 - 100.0 fL   MCH 32.7 26.0 - 34.0 pg   MCHC 36.1 (H) 30.0 - 36.0 g/dL   RDW 29.512.7 28.411.5 - 13.215.5 %   Platelets 255 150 - 400 K/uL  Comprehensive metabolic panel     Status: Abnormal   Collection Time: 10/29/16  1:30 AM  Result Value Ref Range   Sodium 131 (L) 135 - 145 mmol/L   Potassium 3.8 3.5 - 5.1 mmol/L   Chloride 104 101 - 111 mmol/L   CO2 19 (L) 22 - 32 mmol/L  Glucose, Bld 82 65 - 99 mg/dL   BUN <5 (L) 6 - 20 mg/dL   Creatinine, Ser 1.61 0.44 - 1.00 mg/dL   Calcium 8.9 8.9 - 09.6 mg/dL   Total Protein 7.0 6.5 - 8.1 g/dL   Albumin 3.0 (L) 3.5 - 5.0 g/dL   AST 30 15 - 41 U/L   ALT 26 14 - 54 U/L   Alkaline Phosphatase 80 38 - 126 U/L   Total Bilirubin 0.7 0.3 - 1.2 mg/dL   GFR calc non Af Amer >60 >60 mL/min   GFR calc Af Amer >60 >60 mL/min   Anion gap 8 5 - 15  Type and screen [facility]     Status: None   Collection Time: 10/29/16  1:30 AM  Result Value Ref Range   ABO/RH(D) A POS    Antibody Screen NEG    Sample Expiration 11/01/2016   ABO/Rh     Status: None   Collection Time: 10/29/16  1:30 AM  Result Value Ref Range   ABO/RH(D) A POS          Meds:  . docusate sodium  100 mg Oral Daily  . oseltamivir  75 mg Oral BID  . prenatal multivitamin  1 tablet Oral Q1200  Initial/only dose of Tylenol--650 mg at 0146  A: [redacted]w[redacted]d with fever, diarrhea--? Viral illness, flu testing negative.     Stable  P: Continue current plan of care      Hold any further Tylenol this am to observe for any recurrence of fever.      Advance to bland diet as tolerated.      Blood cultures and stool cultures pending.      Observe at present      Re-evaluate later today.      MDs will follow  [name] CNM, [place] 10/29/2016 7:31  AM

## 2016-10-29 NOTE — Progress Notes (Signed)
Pt requesting to go home, denies any further diarrhea since this morning,  Pt informed RN will talk with provider regarding request.  Pt verbalized understanding & agreeable.

## 2016-10-29 NOTE — Discharge Instructions (Signed)
OK TO USE IMODIUM FOR DIARRHEA ACCORDING TO PACKAGE INSTRUCTIONS.  CALL IF ANYTHING GETS WORSE!  Diarrhea, Adult Diarrhea is when you have loose and water poop (stool) often. Diarrhea can make you feel weak and cause you to get dehydrated. Dehydration can make you tired and thirsty, make you have a dry mouth, and make it so you pee (urinate) less often. Diarrhea often lasts 2-3 days. However, it can last longer if it is a sign of something more serious. It is important to treat your diarrhea as told by your doctor. Follow these instructions at home: Eating and drinking  Follow these recommendations as told by your doctor:  Take an oral rehydration solution (ORS). This is a drink that is sold at pharmacies and stores.  Drink clear fluids, such as: ? Water. ? Ice chips. ? Diluted fruit juice. ? Low-calorie sports drinks.  Eat bland, easy-to-digest foods in small amounts as you are able. These foods include: ? Bananas. ? Applesauce. ? Rice. ? Low-fat (lean) meats. ? Toast. ? Crackers.  Avoid drinking fluids that have a lot of sugar or caffeine in them.  Avoid alcohol.  Avoid spicy or fatty foods.  General instructions   Drink enough fluid to keep your pee (urine) clear or pale yellow.  Wash your hands often. If you cannot use soap and water, use hand sanitizer.  Make sure that all people in your home wash their hands well and often.  Take over-the-counter and prescription medicines only as told by your doctor.  Rest at home while you get better.  Watch your condition for any changes.  Take a warm bath to help with any burning or pain from having diarrhea.  Keep all follow-up visits as told by your doctor. This is important. Contact a doctor if:  You have a fever.  Your diarrhea gets worse.  You have new symptoms.  You cannot keep fluids down.  You feel light-headed or dizzy.  You have a headache.  You have muscle cramps. Get help right away if:  You have  chest pain.  You feel very weak or you pass out (faint).  You have bloody or black poop or poop that look like tar.  You have very bad pain, cramping, or bloating in your belly (abdomen).  You have trouble breathing or you are breathing very quickly.  Your heart is beating very quickly.  Your skin feels cold and clammy.  You feel confused.  You have signs of dehydration, such as: ? Dark pee, hardly any pee, or no pee. ? Cracked lips. ? Dry mouth. ? Sunken eyes. ? Sleepiness. ? Weakness. This information is not intended to replace advice given to you by your health care provider. Make sure you discuss any questions you have with your health care provider. Document Released: 11/03/2007 Document Revised: 12/05/2015 Document Reviewed: 01/21/2015 Elsevier Interactive Patient Education  2018 Elsevier Inc. Fetal Movement Counts Patient Name: ________________________________________________ Patient Due Date: ____________________ What is a fetal movement count? A fetal movement count is the number of times that you feel your baby move during a certain amount of time. This may also be called a fetal kick count. A fetal movement count is recommended for every pregnant woman. You may be asked to start counting fetal movements as early as week 28 of your pregnancy. Pay attention to when your baby is most active. You may notice your baby's sleep and wake cycles. You may also notice things that make your baby move more. You should  do a fetal movement count:  When your baby is normally most active.  At the same time each day.  A good time to count movements is while you are resting, after having something to eat and drink. How do I count fetal movements? 1. Find a quiet, comfortable area. Sit, or lie down on your side. 2. Write down the date, the start time and stop time, and the number of movements that you felt between those two times. Take this information with you to your health care  visits. 3. For 2 hours, count kicks, flutters, swishes, rolls, and jabs. You should feel at least 10 movements during 2 hours. 4. You may stop counting after you have felt 10 movements. 5. If you do not feel 10 movements in 2 hours, have something to eat and drink. Then, keep resting and counting for 1 hour. If you feel at least 4 movements during that hour, you may stop counting. Contact a health care provider if:  You feel fewer than 4 movements in 2 hours.  Your baby is not moving like he or she usually does. Date: ____________ Start time: ____________ Stop time: ____________ Movements: ____________ Date: ____________ Start time: ____________ Stop time: ____________ Movements: ____________ Date: ____________ Start time: ____________ Stop time: ____________ Movements: ____________ Date: ____________ Start time: ____________ Stop time: ____________ Movements: ____________ Date: ____________ Start time: ____________ Stop time: ____________ Movements: ____________ Date: ____________ Start time: ____________ Stop time: ____________ Movements: ____________ Date: ____________ Start time: ____________ Stop time: ____________ Movements: ____________ Date: ____________ Start time: ____________ Stop time: ____________ Movements: ____________ Date: ____________ Start time: ____________ Stop time: ____________ Movements: ____________ This information is not intended to replace advice given to you by your health care provider. Make sure you discuss any questions you have with your health care provider. Document Released: 06/16/2006 Document Revised: 01/14/2016 Document Reviewed: 06/26/2015 Elsevier Interactive Patient Education  [employer]2018 Elsevier Inc.

## 2016-10-29 NOTE — Progress Notes (Signed)
Per Infection Prevention, may remove droplet precautions due to negative flu testing.  Patient still with episodes of diarrhea. Will give Immodium 4 mg now, with 2 mg po prn after loose stools, up to total of 16 mg/day.  Continues to be afebrile.  FHR Category 1 on NST.  Vitals:   10/29/16 0407 10/29/16 0549 10/29/16 0758 10/29/16 0956  BP:   (!) 103/56   Pulse:   (!) 105   Resp:   20   Temp:  98.7 F (37.1 C) 99.1 F (37.3 C) 98.6 F (37 C)  TempSrc:  Oral Oral Oral  SpO2: 95%  98%   Weight:      Height:        Will continue to observe. Ok for Tylenol if needed for mild pain, fever.  Nigel BridgemanVicki Anabelle Bungert, CNM 10/29/16 1040

## 2016-10-29 NOTE — Discharge Summary (Signed)
Physician Discharge Summary  Patient ID: Brooke Casey MRN: [id number] DOB/AGE: [birth year] 25 y.o.  Admit date: 10/29/2016 Discharge date: 10/29/2016  Admission Diagnoses:  IUP at 28 weeks, fever of unknown origin, diarrhea  Discharge Diagnoses:  Active Problems:   Fever of undetermined origin   Discharged Condition: good  Hospital Course: Admitted for observation on 10/29/16 due to fever reported to 103 at home, with diarrhea.  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  Temp on admission was 100.4. 20 ketones noted on UA, all other labs WNL.  Given IV hydration, Tylenol with benefit.  Afebrile after single dose, no further temp during night or following morning.  Flu testing was negative, did receive single dose of Tamiflu.  Tolerating a regular diet without difficulty.  Occasional diarrhea still, given Imodium 4 mg dose at 1108, with no further diarrhea.  FHR remained Cat 1 on q shift NST.  She was d/c'd home on the afternoon of 6/1, with instructions for bland diet, Imodium if needed, to f/u with any worsening of sx, and to keep scheduled ROB appt on 09/08/16.  Blood cultures and stool cultures were pending at the time of d/c.  Consults: None  Significant Diagnostic Studies: labs:  Results for orders placed or performed during the hospital encounter of 10/29/16 (from the past 24 hour(s))  Urinalysis, Routine w reflex microscopic     Status: Abnormal   Collection Time: 10/29/16 12:49 AM  Result Value Ref Range   Color, Urine YELLOW YELLOW   APPearance HAZY (A) CLEAR   Specific Gravity, Urine 1.015 1.005 - 1.030   pH 6.0 5.0 - 8.0   Glucose, UA NEGATIVE NEGATIVE mg/dL   Hgb urine dipstick NEGATIVE NEGATIVE   Bilirubin Urine NEGATIVE NEGATIVE   Ketones, ur 20 (A) NEGATIVE mg/dL   Protein, ur NEGATIVE NEGATIVE mg/dL   Nitrite NEGATIVE NEGATIVE   Leukocytes, UA NEGATIVE NEGATIVE  Influenza panel by PCR (type A & B)     Status: None   Collection Time: 10/29/16  1:25 AM   Result Value Ref Range   Influenza A By PCR NEGATIVE NEGATIVE   Influenza B By PCR NEGATIVE NEGATIVE  CBC     Status: Abnormal   Collection Time: 10/29/16  1:30 AM  Result Value Ref Range   WBC 11.2 (H) 4.0 - 10.5 K/uL   RBC 3.70 (L) 3.87 - 5.11 MIL/uL   Hemoglobin 12.1 12.0 - 15.0 g/dL   HCT 40.9 (L) 81.1 - 91.4 %   MCV 90.5 78.0 - 100.0 fL   MCH 32.7 26.0 - 34.0 pg   MCHC 36.1 (H) 30.0 - 36.0 g/dL   RDW 78.2 95.6 - 21.3 %   Platelets 255 150 - 400 K/uL  Comprehensive metabolic panel     Status: Abnormal   Collection Time: 10/29/16  1:30 AM  Result Value Ref Range   Sodium 131 (L) 135 - 145 mmol/L   Potassium 3.8 3.5 - 5.1 mmol/L   Chloride 104 101 - 111 mmol/L   CO2 19 (L) 22 - 32 mmol/L   Glucose, Bld 82 65 - 99 mg/dL   BUN <5 (L) 6 - 20 mg/dL   Creatinine, Ser 0.86 0.44 - 1.00 mg/dL   Calcium 8.9 8.9 - 57.8 mg/dL   Total Protein 7.0 6.5 - 8.1 g/dL   Albumin 3.0 (L) 3.5 - 5.0 g/dL   AST 30 15 - 41 U/L   ALT 26 14 - 54 U/L   Alkaline Phosphatase 80 38 -  126 U/L   Total Bilirubin 0.7 0.3 - 1.2 mg/dL   GFR calc non Af Amer >60 >60 mL/min   GFR calc Af Amer >60 >60 mL/min   Anion gap 8 5 - 15  Type and screen [facility]     Status: None   Collection Time: 10/29/16  1:30 AM  Result Value Ref Range   ABO/RH(D) A POS    Antibody Screen NEG    Sample Expiration 11/01/2016   ABO/Rh     Status: None   Collection Time: 10/29/16  1:30 AM  Result Value Ref Range   ABO/RH(D) A POS     Treatments: IV hydration  Discharge Exam: Blood pressure (!) 96/52, pulse (!) 102, temperature 99.3 F (37.4 C), temperature source Oral, resp. rate 16, height 5\' 4"  (1.626 m), weight 101.3 kg (223 lb 4 oz), SpO2 98 %. General appearance: alert Resp: clear to auscultation bilaterally Cardio: regular rate and rhythm, S1, S2 normal, no murmur, click, rub or gallop GI: soft, non-tender; bowel sounds normal; no masses,  no organomegaly Pelvic: cervix normal in appearance,  external genitalia normal, no adnexal masses or tenderness, no cervical motion tenderness, rectovaginal septum normal, uterus normal size, shape, and consistency and vagina normal without discharge Extremities: extremities normal, atraumatic, no cyanosis or edema  Disposition: 01-Home or Self Care  Discharge Instructions    Discharge patient    Complete by:  As directed    Discharge disposition:  01-Home or Self Care   Discharge patient date:  10/29/2016   Discharge patient    Complete by:  As directed    Discharge disposition:  01-Home or Self Care   Discharge patient date:  10/29/2016     Allergies as of 10/29/2016   No Known Allergies     Medication List    TAKE these medications   prenatal multivitamin Tabs tablet Take 1 tablet by mouth daily at 12 noon.      Follow-up Information    [facility] on [facility]/08/2016.   Specialty:  Obstetrics and Gynecology Why:  Call for any questions or concerns. Contact information: [address] [place] [phone]          Signed: Nigel BridgemanLATHAM, Jubal Rademaker 10/29/2016, 1:51 PM

## 2016-10-29 NOTE — MAU Provider Note (Signed)
History     Patient Active Problem List   Diagnosis Date Noted  . Closed fracture of distal phalanx or phalanges of hand 01/13/2015  . Pain of finger of left hand 01/13/2015  . Finger swelling 01/13/2015    No chief complaint on file.  Brooke Casey is a 26y.o. G1P0 at 28wks who presents, after phone call, for fever.  Patient states that she took her temperature and it was 103.8 and 103.9 after repeating.  Patient also reports chills, HA, nausea, and myalgias as well as bouts of diarrhea after eating.  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  Patient has not taken anything OTC and states that all her symptoms started today.  Patient denies LOF, VB, and contractions while endorsing fetal movement.      OB History    No data available      No past medical history on file.  No past surgical history on file.  No family history on file.  Social History  Substance Use Topics  . Smoking status: Never Smoker  . Smokeless tobacco: Not on file  . Alcohol use No    Allergies: No Known Allergies  Prescriptions Prior to Admission  Medication Sig Dispense Refill Last Dose  . doxycycline (VIBRAMYCIN) 100 MG capsule Take 1 capsule (100 mg total) by mouth 2 (two) times daily. (Patient not taking: Reported on 08/04/2015) 20 capsule 0 Not Taking  . fluocinonide ointment (LIDEX) 0.05 % Apply 1 application topically 2 (two) times daily. 15 g 0   . naproxen sodium (ANAPROX DS) 550 MG tablet Take 1 tablet (550 mg total) by mouth 2 (two) times daily with a meal. (Patient not taking: Reported on 08/04/2015) 30 tablet 0 Not Taking    Review of Systems  Constitutional: Positive for chills, fever and malaise/fatigue.  HENT: Negative for congestion and sinus pain.   Respiratory: Negative for cough, sputum production and shortness of breath.   Gastrointestinal:  Positive for diarrhea and nausea. Negative for abdominal pain and vomiting.  Musculoskeletal: Positive for myalgias.  Neurological: Positive for weakness and headaches.    See HPI Above Physical Exam   Blood pressure 115/70, pulse 122, temperature 100.4 F (38 C), temperature source Oral, resp. rate 20 height 5\' 4"  (1.626 m), weight 101.3 kg (223 lb 4 oz).  Results for orders placed or performed during the hospital encounter of 10/29/16 (from the past 24 hour(s))  Urinalysis, Routine w reflex microscopic     Status: Abnormal   Collection Time: 10/29/16 12:49 AM  Result Value Ref Range   Color, Urine YELLOW YELLOW   APPearance HAZY (A) CLEAR   Specific Gravity, Urine 1.015 1.005 - 1.030   pH 6.0 5.0 - 8.0   Glucose, UA NEGATIVE NEGATIVE mg/dL   Hgb urine dipstick NEGATIVE NEGATIVE   Bilirubin Urine NEGATIVE NEGATIVE   Ketones, ur 20 (A) NEGATIVE mg/dL   Protein, ur NEGATIVE NEGATIVE mg/dL   Nitrite NEGATIVE NEGATIVE   Leukocytes, UA NEGATIVE NEGATIVE  Influenza panel by PCR (type A & B)     Status: None   Collection Time: 10/29/16  1:25 AM  Result Value Ref Range   Influenza A By PCR NEGATIVE NEGATIVE   Influenza B By PCR NEGATIVE NEGATIVE  CBC     Status: Abnormal   Collection Time: 10/29/16  1:30 AM  Result Value Ref Range  WBC 11.2 (H) 4.0 - 10.5 K/uL   RBC 3.70 (L) 3.87 - 5.11 MIL/uL   Hemoglobin 12.1 12.0 - 15.0 g/dL   HCT 96.033.5 (L) 45.436.0 - 09.846.0 %   MCV 90.5 78.0 - 100.0 fL   MCH 32.7 26.0 - 34.0 pg   MCHC 36.1 (H) 30.0 - 36.0 g/dL   RDW 11.912.7 14.711.5 - 82.915.5 %   Platelets 255 150 - 400 K/uL  Comprehensive metabolic panel     Status: Abnormal   Collection Time: 10/29/16  1:30 AM  Result Value Ref Range   Sodium 131 (L) 135 - 145 mmol/L   Potassium 3.8 3.5 - 5.1 mmol/L   Chloride 104 101 - 111 mmol/L   CO2 19 (L) 22 - 32 mmol/L   Glucose, Bld 82 65 - 99 mg/dL   BUN <5 (L) 6 - 20 mg/dL   Creatinine, Ser 5.620.54 0.44 - 1.00 mg/dL   Calcium 8.9 8.9 - 13.010.3 mg/dL   Total  Protein 7.0 6.5 - 8.1 g/dL   Albumin 3.0 (L) 3.5 - 5.0 g/dL   AST 30 15 - 41 U/L   ALT 26 14 - 54 U/L   Alkaline Phosphatase 80 38 - 126 U/L   Total Bilirubin 0.7 0.3 - 1.2 mg/dL   GFR calc non Af Amer >60 >60 mL/min   GFR calc Af Amer >60 >60 mL/min   Anion gap 8 5 - 15  Type and screen [facility] [place]     Status: None   Collection Time: 10/29/16  1:30 AM  Result Value Ref Range   ABO/RH(D) A POS    Antibody Screen NEG    Sample Expiration 11/01/2016   ABO/Rh     Status: None   Collection Time: 10/29/16  1:30 AM  Result Value Ref Range   ABO/RH(D) A POS     Physical Exam  Constitutional: She is oriented to person, place, and time. She appears well-developed and well-nourished. No distress.  HENT:  Head: Normocephalic and atraumatic.  Eyes: Conjunctivae are normal.  Neck: Normal range of motion.  Cardiovascular: Normal rate, regular rhythm and normal heart sounds.   Respiratory: Effort normal and breath sounds normal.  GI: Soft. Bowel sounds are normal.  Gravid--fundal height appears AGA, Soft, NT  Musculoskeletal: Normal range of motion. She exhibits edema.  Neurological: She is alert and oriented to person, place, and time.  Skin: Skin is warm and dry.     FHR:150 bpm, Mod Var, -Decels, +Accels UC: None graphed or palpated ED Course  Assessment: IUP at 28wks Cat I FT Fever Diarrhea   Plan: -PE as above -R/O Influenza -Labs: UA, CBC, CMP, INFLUENZA PANEL  Follow Up (0300) -Labs as above -Dr. Carmela HurtE. Kulwa consulted who advised: *Admit for observation *Temperature Q2 hrs as appropriate *Blood Cultures *Stool Cultures *Start Tamiflu for suspected influenza despite negative labs -Patient informed of POC and verbalizes understanding -No q/c -To Antepartum for observation  Cherre RobinsJessica L Iqra Rotundo CNM, MSN 10/29/2016 12:31 AM

## 2016-10-30 LAB — CULTURE, OB URINE

## 2016-11-03 LAB — OVA + PARASITE EXAM

## 2016-11-03 LAB — CULTURE, BLOOD (ROUTINE X 2)
Culture: NO GROWTH
Culture: NO GROWTH
SPECIAL REQUESTS: ADEQUATE
Special Requests: ADEQUATE

## 2016-11-03 LAB — O&P RESULT

## 2016-12-23 LAB — OB RESULTS CONSOLE GBS: GBS: NEGATIVE

## 2017-01-26 ENCOUNTER — Telehealth (HOSPITAL_COMMUNITY): Payer: Self-pay | Admitting: *Deleted

## 2017-01-26 ENCOUNTER — Encounter (HOSPITAL_COMMUNITY): Payer: Self-pay | Admitting: *Deleted

## 2017-01-26 ENCOUNTER — Other Ambulatory Visit: Payer: Self-pay | Admitting: Obstetrics and Gynecology

## 2017-01-26 NOTE — Telephone Encounter (Signed)
Preadmission screen  

## 2017-01-27 ENCOUNTER — Encounter (HOSPITAL_COMMUNITY): Payer: Self-pay

## 2017-01-27 ENCOUNTER — Inpatient Hospital Stay (HOSPITAL_COMMUNITY)
Admission: RE | Admit: 2017-01-27 | Discharge: 2017-01-31 | DRG: 765 | Disposition: A | Discharge status: Home / Self Care | Payer: BLUE CROSS/BLUE SHIELD | Source: Ambulatory Visit | Attending: Obstetrics and Gynecology | Admitting: Obstetrics and Gynecology

## 2017-01-27 DIAGNOSIS — O480 Post-term pregnancy: Secondary | ICD-10-CM | POA: Diagnosis present

## 2017-01-27 DIAGNOSIS — O3663X0 Maternal care for excessive fetal growth, third trimester, not applicable or unspecified: Secondary | ICD-10-CM | POA: Diagnosis present

## 2017-01-27 DIAGNOSIS — O9832 Other infections with a predominantly sexual mode of transmission complicating childbirth: Secondary | ICD-10-CM | POA: Diagnosis present

## 2017-01-27 DIAGNOSIS — Z98891 History of uterine scar from previous surgery: Secondary | ICD-10-CM

## 2017-01-27 DIAGNOSIS — O1404 Mild to moderate pre-eclampsia, complicating childbirth: Secondary | ICD-10-CM | POA: Diagnosis present

## 2017-01-27 DIAGNOSIS — Z6841 Body Mass Index (BMI) 40.0 and over, adult: Secondary | ICD-10-CM

## 2017-01-27 DIAGNOSIS — O43129 Velamentous insertion of umbilical cord, unspecified trimester: Secondary | ICD-10-CM | POA: Diagnosis present

## 2017-01-27 DIAGNOSIS — O43123 Velamentous insertion of umbilical cord, third trimester: Secondary | ICD-10-CM | POA: Diagnosis present

## 2017-01-27 DIAGNOSIS — O99891 Other specified diseases and conditions complicating pregnancy: Secondary | ICD-10-CM | POA: Diagnosis present

## 2017-01-27 DIAGNOSIS — A609 Anogenital herpesviral infection, unspecified: Secondary | ICD-10-CM | POA: Diagnosis not present

## 2017-01-27 DIAGNOSIS — A6000 Herpesviral infection of urogenital system, unspecified: Secondary | ICD-10-CM | POA: Diagnosis present

## 2017-01-27 DIAGNOSIS — O43199 Other malformation of placenta, unspecified trimester: Secondary | ICD-10-CM | POA: Diagnosis present

## 2017-01-27 DIAGNOSIS — Z3A40 40 weeks gestation of pregnancy: Secondary | ICD-10-CM

## 2017-01-27 DIAGNOSIS — O99214 Obesity complicating childbirth: Secondary | ICD-10-CM | POA: Diagnosis present

## 2017-01-27 DIAGNOSIS — O1400 Mild to moderate pre-eclampsia, unspecified trimester: Secondary | ICD-10-CM

## 2017-01-27 DIAGNOSIS — Z91013 Allergy to seafood: Secondary | ICD-10-CM | POA: Diagnosis present

## 2017-01-27 LAB — TYPE AND SCREEN
ABO/RH(D): A POS
ANTIBODY SCREEN: NEGATIVE

## 2017-01-27 LAB — CBC
HEMATOCRIT: 35.1 % — AB (ref 36.0–46.0)
HEMOGLOBIN: 12.5 g/dL (ref 12.0–15.0)
MCH: 31.6 pg (ref 26.0–34.0)
MCHC: 35.6 g/dL (ref 30.0–36.0)
MCV: 88.6 fL (ref 78.0–100.0)
Platelets: 257 10*3/uL (ref 150–400)
RBC: 3.96 MIL/uL (ref 3.87–5.11)
RDW: 13.4 % (ref 11.5–15.5)
WBC: 7.9 10*3/uL (ref 4.0–10.5)

## 2017-01-27 LAB — RPR: RPR Ser Ql: NONREACTIVE

## 2017-01-27 MED ORDER — FENTANYL CITRATE (PF) 100 MCG/2ML IJ SOLN
100.0000 ug | INTRAMUSCULAR | Status: DC | PRN
  Administered 2017-01-28 (×2): 100 ug via INTRAVENOUS
  Filled 2017-01-27 (×3): qty 2

## 2017-01-27 MED ORDER — ZOLPIDEM TARTRATE 5 MG PO TABS
5.0000 mg | ORAL_TABLET | Freq: Every evening | ORAL | Status: DC | PRN
Start: 2017-01-27 — End: 2017-01-28
  Administered 2017-01-27: 5 mg via ORAL
  Filled 2017-01-27: qty 1

## 2017-01-27 MED ORDER — OXYTOCIN 40 UNITS IN LACTATED RINGERS INFUSION - SIMPLE MED: 2.5000 [IU]/h | INTRAVENOUS | Status: DC

## 2017-01-27 MED ORDER — ACETAMINOPHEN 325 MG PO TABS
650.0000 mg | ORAL_TABLET | ORAL | Status: DC | PRN
Start: 2017-01-27 — End: 2017-01-28

## 2017-01-27 MED ORDER — FLEET ENEMA 7-19 GM/118ML RE ENEM: 1.0000 | ENEMA | RECTAL | Status: DC | PRN

## 2017-01-27 MED ORDER — OXYTOCIN 40 UNITS IN LACTATED RINGERS INFUSION - SIMPLE MED
1.0000 m[IU]/min | INTRAVENOUS | Status: DC
  Administered 2017-01-27: 1 m[IU]/min via INTRAVENOUS
  Administered 2017-01-28: 14 m[IU]/min via INTRAVENOUS
  Administered 2017-01-28: 12 m[IU]/min via INTRAVENOUS
  Filled 2017-01-27 (×2): qty 1000

## 2017-01-27 MED ORDER — MISOPROSTOL 25 MCG QUARTER TABLET
25.0000 ug | ORAL_TABLET | ORAL | Status: DC | PRN
  Administered 2017-01-27 (×2): 25 ug via VAGINAL
  Filled 2017-01-27 (×2): qty 1

## 2017-01-27 MED ORDER — OXYCODONE-ACETAMINOPHEN 5-325 MG PO TABS: 1.0000 | ORAL_TABLET | ORAL | Status: DC | PRN

## 2017-01-27 MED ORDER — OXYTOCIN BOLUS FROM INFUSION: 500.0000 mL | Freq: Once | INTRAVENOUS | Status: DC

## 2017-01-27 MED ORDER — FENTANYL CITRATE (PF) 100 MCG/2ML IJ SOLN
50.0000 ug | Freq: Once | INTRAMUSCULAR | Status: AC
  Administered 2017-01-27: 50 ug via INTRAVENOUS

## 2017-01-27 MED ORDER — SOD CITRATE-CITRIC ACID 500-334 MG/5ML PO SOLN
30.0000 mL | ORAL | Status: DC | PRN
  Administered 2017-01-28: 30 mL via ORAL
  Filled 2017-01-27: qty 15

## 2017-01-27 MED ORDER — LACTATED RINGERS IV SOLN
500.0000 mL | INTRAVENOUS | Status: DC | PRN
  Administered 2017-01-27: 500 mL via INTRAVENOUS
  Administered 2017-01-28: 300 mL via INTRAVENOUS
  Administered 2017-01-28: 500 mL via INTRAVENOUS

## 2017-01-27 MED ORDER — LIDOCAINE HCL (PF) 1 % IJ SOLN: 30.0000 mL | INTRAMUSCULAR | Status: DC | PRN

## 2017-01-27 MED ORDER — ONDANSETRON HCL 4 MG/2ML IJ SOLN: 4.0000 mg | Freq: Four times a day (QID) | INTRAMUSCULAR | Status: DC | PRN

## 2017-01-27 MED ORDER — OXYCODONE-ACETAMINOPHEN 5-325 MG PO TABS: 2.0000 | ORAL_TABLET | ORAL | Status: DC | PRN

## 2017-01-27 MED ORDER — LACTATED RINGERS IV SOLN
INTRAVENOUS | Status: DC
  Administered 2017-01-27: 20:00:00 via INTRAVENOUS
  Administered 2017-01-27: 125 mL/h via INTRAVENOUS
  Administered 2017-01-28 (×4): via INTRAVENOUS

## 2017-01-27 MED ORDER — TERBUTALINE SULFATE 1 MG/ML IJ SOLN: 0.2500 mg | Freq: Once | INTRAMUSCULAR | Status: DC | PRN

## 2017-01-27 NOTE — Progress Notes (Addendum)
Brooke Casey MRN: [id number]  Subjective: -Care assumed of 26 y.o. G1P0 at 3047w6d who presents for IOL s/t Post Dates.  Patient is under the care of [facility].  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   In room to meet acquaintance of patient and family.  Reports some perception of contractions, but mostly fetal movement.  No pain at current.   Objective: BP 112/71 (BP Location: Left Arm)   Pulse 89   Temp 98.2 F (36.8 C) (Oral)   Resp 16  No intake/output data recorded. No intake/output data recorded.  Fetal Monitoring: FHT: 150 bpm, Mod Var, -Decels, +Accels UC: Q861min, palpates mild    Physical Exam: General appearance: alert, well appearing, and in no distress. Chest: normal rate and regular rhythm.  clear to auscultation, no wheezes, rales or rhonchi, symmetric air entry. Abdominal exam: Gravid-Appears LGA, Soft RT, NT, BS x 4. GU: Sterile Speculum Exam: -Labia: Appears slightly inflamed.  Cyst noted near urethra area on left side. -Vaginal Vault: Appears inflamed, thick white discharge noted at posterior fornix -wet prep collected -Cervix: Appears slightly open ~1.5cm and think.  Able to bring forward with aggressive coughing from patient.  Cooks catheter inserted w/o difficulty. Vaginal gel applied to vaginal walls to promote patient comfort -Bimanual Exam: Deferred  Extremities: Edema Skin exam: Warm Dry  Vaginal Exam: SVE:   Dilation: 1 Effacement (%): 50 Station: -3 Exam by:: Nigel BridgemanVicki Latham, CNM at 1700 Membranes:Intact Internal Monitors: None  Augmentation/Induction: Pitocin:105mUn/min Cytotec: S/P 2 Doses Foley Bulb inserted via speculum with 60mL infused into uterine balloon and 40mL in vaginal balloon.  Patient tolerated well s/p dosage of fentanyl.   Assessment:  IUP at 40.6wks Cat I FT  PostDates IOL  Bishop Score: 4 per VL Exam at  1700   Plan: --Discussed foley bulb insertion including r/b, placement, associated discomfort, initiation of pitocin, and removal. -Patient nervous, but agrees to placement. Offered and accepts IV fentanyl 50mcg prior to procedure. -Encouraged to rest, requests sleep aide.  Ambian ordered -Informed of availability of epidural and IV medication for pain medication as needed -No other questions or concerns -Titrate pitocin, as appropriate, to no higher than 538mUn/min -Continue other mgmt as ordered  [name],MSN, CNM 01/27/2017, 7:20 PM   Addendum (2105) Foley bulb out  VE: Dilation: 5 Effacement (%): 50 Cervical Position: Posterior Station: -3 Presentation: Vertex Exam by:: [name], CNM   -Discussed AROM r/b including increased risk of infection, cord prolapse, fetal intolerance, and decreased labor time. No questions or concerns and patient desires to proceed with AROM.  -Fetal station too high and patient very uncomfortable with exam.  Will wait until presenting part lower and patient with proper pain management.  -Discussed epidural placement vs rest.  Patient informed next VE will not occur until at least 4 hours or if necessary before -Continue other mgmt as ordered  Cherre RobinsJessica L Poet Hineman MSN, CNM 01/27/2017 9:14 PM

## 2017-01-27 NOTE — Progress Notes (Signed)
  Subjective: Not aware of any UCs.  Has been sleeping at intervals.  Partner at bedside.  Objective: BP (!) 110/58 (BP Location: Left Arm)   Pulse 96   Temp 98.9 F (37.2 C) (Oral)   Resp 16  No intake/output data recorded. No intake/output data recorded.  FHT: Category 1 UC:   irregular, every 7-9 minutes SVE:   Dilation: 1 Effacement (%): Thick Station: -3 Exam by:: Nigel BridgemanVicki Saysha Menta, CNM  Cervix still very posterior 2nd dose Cytotech placed in posterior fornix.  Assessment:  Induction for postdates, velamentous insertion. Unfavorable cervix GBS negative  Plan: Continue current care Reassess in 4 hours.  Nigel BridgemanLATHAM, Yuritzi Kamp CNM 01/27/2017, 1245

## 2017-01-27 NOTE — Progress Notes (Addendum)
  Subjective: Doing well, not aware of UCs.  Sitting on couch.  Objective: BP 135/75 (BP Location: Left Arm)   Pulse 100   Temp 98.9 F (37.2 C) (Oral)   Resp 16  No intake/output data recorded. No intake/output data recorded.   Vitals:   01/27/17 1239 01/27/17 1240 01/27/17 1500 01/27/17 1705  BP:  (!) 110/58 135/75 129/83  Pulse:  96 100 93  Resp:  16 16 16   Temp: 98.9 F (37.2 C)     TempSrc: Oral       FHT: Category 1 UC:   regular, every 6-7 minutes SVE:   Dilation: 1 Effacement (%): 50 Station: -3 Exam by:: Nigel BridgemanVicki Judiann Celia, CNM  Cervix still very posterior, exam very painful for patient. Cervix soft, external os 1 cm, internal os 2 cm. 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 May consider foley bulb as cervix moves more anterior. Patient plans epidural as labor advances.  Nigel BridgemanLATHAM, Jevon Shells CNM 01/27/2017, 5:09 PM

## 2017-01-27 NOTE — H&P (Signed)
Brooke Casey is a 26 y.o. female, G1P0 at 5140 6/7 weeks, presenting for induction due to postdates and velamentous insertion, with a dilated umbilical vein noted on 3rd trimester US.  Induction was recommended at 40 weeks, but patient desired to wait until postdates for induction.  Hx of HSV 1&2 positive by titer in 2017, but no recent or current lesions, has been on Valtrex suppression since 36 weeks.  Cervix was 2-3 cm, 30%, vtx, -3 on exam by Dr. Stefano GaulStringer 01/20/17.  Patient Active Problem List   Diagnosis Date Noted  . HSV (herpes simplex virus) anogenital infection 01/27/2017  . Velamentous insertion of umbilical cord 01/27/2017  . Pregnancy complicated by umbilical cord varix in antepartum period 01/27/2017  . BMI 40.0-44.9, adult (HCC) 01/27/2017  . Marginal insertion of umbilical cord affecting management of mother 01/27/2017  . Post-dates pregnancy 01/27/2017  . Shellfish allergy 01/27/2017    History of present pregnancy: Patient entered care at 6-7  weeks.   EDC of 01/21/17 was established by LMP and in agreement with US at 12 4/7 weeks.   Anatomy scan:  19 6/7 weeks, with velamentous and marginal insertion of cord, anterior placenta, normal anatomy and growth.   Additional US evaluations:  Followed growth q 4 weeks. 27 5/7 weeks:  Breech, fluid and growth normal 31 4/7 weeks:  EFW 58%ile, dilated intra-abdominal vein, BPP 8/8, normal fluid, vtx. 32 6/7 weeks--BPP 8/8, AFI WNL, umbilical vein measurement stable Weekly BPPs since then, all WNL. 34 6/7 weeks:  EFW 5+13, 58%ile, normal fluid, vtx, umbilical vein stable 38 6/7 weeks:  EFW 7+6, 44%ile, normal fluid, umbilical vein stable, vtx. Significant prenatal events:  Positive urine culture for E coli at NOB, treated, still positive, but negative culture 10/29/16.  Declined flu vaccine.  Normal genetic testing.  Followed for growth and BPPs due to velamentous insertion and umbilical vein dilation.  Admitted at 28 weeks for fever,  viral sx, all cultures negative.  Induction recommended at 40 weeks due to velamentous insertion, but patient declined until post-dates. Last evaluation:  01/20/17--cervix 2-3, 30%, vtx, -3, BP 108/64, 243 lbs.  BPP 8/8, AFI WNL.  OB History    Gravida Para Term Preterm AB Living   1             SAB TAB Ectopic Multiple Live Births                 Past Medical History:  Diagnosis Date  . HSV (herpes simplex virus) anogenital infection   . Velamentous insertion of umbilical cord    Past Surgical History:  Procedure Laterality Date  . NO PAST SURGERIES     Family History: No significant family history  Social History:  reports that she has never smoked. She has never used smokeless tobacco. She reports that she does not drink alcohol or use drugs.  Patient is African-American, of the [place] faith, has some college, employed as Leisure centre managerbartender.  Partner, Pola Cornmile Kimbeni, is involved and supportive.   Prenatal Transfer Tool  Maternal Diabetes: No Genetic Screening: Normal 1st trimester screen and AFP Maternal Ultrasounds/Referrals: Abnormal:  Findings:   Other:Velamentous insertion, marginal insertion, dilated umbilical vein. Fetal Ultrasounds or other Referrals:  None Maternal Substance Abuse:  No Significant Maternal Medications:  None Significant Maternal Lab Results: Lab values include: Group B Strep negative  TDAP 11/23/16 Flu Declined  ROS:  Fetal movement, occasional UCs  Allergies  Allergen Reactions  . Shellfish Allergy      Dilation: 1  Effacement (%): Thick Station: -3 Exam by:: Doloris Hall RN Blood pressure 113/78, pulse 96, temperature 98.5 F (36.9 C), temperature source Oral, resp. rate 16.  Chest clear Heart RRR without murmur Abd gravid, NT, FH 39 cm Pelvic: Cervix very posterior, 1 cm, thick, soft, vtx, -3.  Exam difficult for patient. No HSV lesions noted on speculum exam.  Small vaginal inclusion cyst noted on anterior vag wall, NT,  non-draining. Ext: WNL  FHR: Category 1 UCs:  Occasional, mild  Prenatal labs: ABO, Rh: --/--/A POS, A POS (06/01 0130) Antibody: NEG (06/01 0130) Rubella:  Immune RPR: Nonreactive (01/03 0000)  HBsAg: Negative (01/03 0000)  HIV: Non-reactive (01/03 0000)  GBS: Negative (07/26 0000) Sickle cell/Hgb electrophoresis:  AA Pap:  04/2016 WNL GC:  Neg 06/02/16 Chlamydia:  Neg 06/02/16 Genetic screenings:  Normal 1st trimester screen and AFP Glucola:  WNL Other:   Hgb 12.9 at NOB, 12.6 at 28 weeks       Assessment/Plan: IUP at 40 6/7 weeks Velamentous insertion Marginal insertion GBS negative Umbilical vein dilation BMI 41 HSV 1 and 2 by titer, no recent or current outbreaks Shellfish allergy  Plan: Admit to Birthing Suite per consult with Dr. Su Hilt Routine [facility] orders Pain med/epidural prn Risks and benefits of induction were reviewed, including failure of method, prolonged labor, need for further intervention, risk of cesarean.  Patient and partner seem to understand these risks and wish to proceed. Options of cytotech, foley bulb, AROM, and pitocin reviewed, with use of each discussed. Cytotech placed in posterior fornix at 8:30a--consider foley as cervix comes more forward. Patient plans inpatient circumcision for baby boy, "[place]".  [name] CNM, [place] 01/27/2017, 8:44 AM

## 2017-01-27 NOTE — Anesthesia Pain Management Evaluation Note (Signed)
  CRNA Pain Management Visit Note  Patient: Brooke Casey, 26 y.o., female  "Hello I am a member of the anesthesia team at [facility]. We have an anesthesia team available at all times to provide care throughout the hospital, including epidural management and anesthesia for C-section. I don't know your plan for the delivery whether it a natural birth, water birth, IV sedation, nitrous supplementation, doula or epidural, but we want to meet your pain goals."   1.Was your pain managed to your expectations on prior hospitalizations?   No prior hospitalizations  2.What is your expectation for pain management during this hospitalization?     Epidural  3.How can we help you reach that goal? Epidural a pain goal.  Record the patient's initial score and the patient's pain goal.   Pain: 5  Pain Goal: 8 [facility] wants you to be able to say your pain was always managed very well.  Brooke Casey 01/27/2017

## 2017-01-28 ENCOUNTER — Encounter (HOSPITAL_COMMUNITY)
Admission: RE | Disposition: A | Discharge status: Home / Self Care | Payer: Self-pay | Source: Ambulatory Visit | Attending: Obstetrics and Gynecology

## 2017-01-28 ENCOUNTER — Encounter (HOSPITAL_COMMUNITY): Payer: Self-pay

## 2017-01-28 ENCOUNTER — Inpatient Hospital Stay (HOSPITAL_COMMUNITY): Payer: BLUE CROSS/BLUE SHIELD | Admitting: Anesthesiology

## 2017-01-28 LAB — CBC
HEMATOCRIT: 36.8 % (ref 36.0–46.0)
Hemoglobin: 12.9 g/dL (ref 12.0–15.0)
MCH: 31.5 pg (ref 26.0–34.0)
MCHC: 35.1 g/dL (ref 30.0–36.0)
MCV: 90 fL (ref 78.0–100.0)
Platelets: 213 10*3/uL (ref 150–400)
RBC: 4.09 MIL/uL (ref 3.87–5.11)
RDW: 13.5 % (ref 11.5–15.5)
WBC: 9.3 10*3/uL (ref 4.0–10.5)

## 2017-01-28 LAB — COMPREHENSIVE METABOLIC PANEL
ALBUMIN: 2.7 g/dL — AB (ref 3.5–5.0)
ALK PHOS: 195 U/L — AB (ref 38–126)
ALT: 16 U/L (ref 14–54)
ANION GAP: 9 (ref 5–15)
AST: 19 U/L (ref 15–41)
BUN: 7 mg/dL (ref 6–20)
CALCIUM: 8.9 mg/dL (ref 8.9–10.3)
CO2: 23 mmol/L (ref 22–32)
Chloride: 106 mmol/L (ref 101–111)
Creatinine, Ser: 0.56 mg/dL (ref 0.44–1.00)
GFR calc Af Amer: >60 mL/min (ref >60)
GFR calc non Af Amer: >60 mL/min (ref >60)
GLUCOSE: 82 mg/dL (ref 65–99)
Potassium: 3.6 mmol/L (ref 3.5–5.1)
SODIUM: 138 mmol/L (ref 135–145)
Total Bilirubin: 0.5 mg/dL (ref 0.3–1.2)
Total Protein: 7 g/dL (ref 6.5–8.1)

## 2017-01-28 LAB — PROTEIN / CREATININE RATIO, URINE
CREATININE, URINE: 31 mg/dL
PROTEIN CREATININE RATIO: 0.52 mg/mg{creat} — AB (ref 0.00–0.15)
Total Protein, Urine: 16 mg/dL

## 2017-01-28 LAB — URIC ACID: Uric Acid, Serum: 6.5 mg/dL (ref 2.3–6.6)

## 2017-01-28 LAB — LACTATE DEHYDROGENASE: LDH: 120 U/L (ref 98–192)

## 2017-01-28 SURGERY — Surgical Case
Anesthesia: Epidural | Site: Abdomen | Wound class: Clean Contaminated

## 2017-01-28 MED ORDER — NALBUPHINE HCL 10 MG/ML IJ SOLN: 5.0000 mg | Freq: Once | INTRAMUSCULAR | Status: DC | PRN

## 2017-01-28 MED ORDER — PHENYLEPHRINE 40 MCG/ML (10ML) SYRINGE FOR IV PUSH (FOR BLOOD PRESSURE SUPPORT): 80.0000 ug | PREFILLED_SYRINGE | INTRAVENOUS | Status: DC | PRN

## 2017-01-28 MED ORDER — SODIUM CHLORIDE 0.9 % IR SOLN
Status: DC | PRN
  Administered 2017-01-28: 350 mL

## 2017-01-28 MED ORDER — MEPERIDINE HCL 25 MG/ML IJ SOLN: 6.2500 mg | INTRAMUSCULAR | Status: DC | PRN

## 2017-01-28 MED ORDER — FENTANYL 2.5 MCG/ML BUPIVACAINE 1/10 % EPIDURAL INFUSION (WH - ANES)
14.0000 mL/h | INTRAMUSCULAR | Status: DC | PRN
  Administered 2017-01-28 (×3): 14 mL/h via EPIDURAL
  Filled 2017-01-28: qty 100

## 2017-01-28 MED ORDER — DEXTROSE 5 % IV SOLN
500.0000 mg | Freq: Once | INTRAVENOUS | Status: AC
  Administered 2017-01-28: 500 mg via INTRAVENOUS
  Filled 2017-01-28: qty 500

## 2017-01-28 MED ORDER — CEFAZOLIN SODIUM-DEXTROSE 2-4 GM/100ML-% IV SOLN
INTRAVENOUS | Status: AC
  Filled 2017-01-28: qty 100

## 2017-01-28 MED ORDER — EPHEDRINE 5 MG/ML INJ: 10.0000 mg | INTRAVENOUS | Status: DC | PRN

## 2017-01-28 MED ORDER — PRENATAL MULTIVITAMIN CH
1.0000 | ORAL_TABLET | Freq: Every day | ORAL | Status: DC
  Administered 2017-01-29 – 2017-01-30 (×2): 1 via ORAL
  Filled 2017-01-28 (×2): qty 1

## 2017-01-28 MED ORDER — STERILE WATER FOR IRRIGATION IR SOLN
Status: DC | PRN
  Administered 2017-01-28: 1000 mL

## 2017-01-28 MED ORDER — SODIUM BICARBONATE 8.4 % IV SOLN
INTRAVENOUS | Status: DC | PRN
  Administered 2017-01-28 (×3): 5 mL via EPIDURAL

## 2017-01-28 MED ORDER — DIPHENHYDRAMINE HCL 25 MG PO CAPS: 25.0000 mg | ORAL_CAPSULE | Freq: Four times a day (QID) | ORAL | Status: DC | PRN

## 2017-01-28 MED ORDER — PHENYLEPHRINE 40 MCG/ML (10ML) SYRINGE FOR IV PUSH (FOR BLOOD PRESSURE SUPPORT)
PREFILLED_SYRINGE | INTRAVENOUS | Status: AC
  Filled 2017-01-28: qty 20

## 2017-01-28 MED ORDER — SIMETHICONE 80 MG PO CHEW: 80.0000 mg | CHEWABLE_TABLET | ORAL | Status: DC | PRN

## 2017-01-28 MED ORDER — ZOLPIDEM TARTRATE 5 MG PO TABS: 5.0000 mg | ORAL_TABLET | Freq: Every evening | ORAL | Status: DC | PRN

## 2017-01-28 MED ORDER — FENTANYL 2.5 MCG/ML BUPIVACAINE 1/10 % EPIDURAL INFUSION (WH - ANES)
INTRAMUSCULAR | Status: AC
  Filled 2017-01-28: qty 100

## 2017-01-28 MED ORDER — SODIUM BICARBONATE 8.4 % IV SOLN
INTRAVENOUS | Status: AC
  Filled 2017-01-28: qty 50

## 2017-01-28 MED ORDER — OXYCODONE HCL 5 MG PO TABS
10.0000 mg | ORAL_TABLET | ORAL | Status: DC | PRN
  Administered 2017-01-29 – 2017-01-31 (×7): 10 mg via ORAL
  Filled 2017-01-28 (×7): qty 2

## 2017-01-28 MED ORDER — DIPHENHYDRAMINE HCL 50 MG/ML IJ SOLN: 12.5000 mg | INTRAMUSCULAR | Status: DC | PRN

## 2017-01-28 MED ORDER — IBUPROFEN 600 MG PO TABS: 600.0000 mg | ORAL_TABLET | Freq: Four times a day (QID) | ORAL | Status: DC

## 2017-01-28 MED ORDER — LACTATED RINGERS IV SOLN
INTRAVENOUS | Status: DC | PRN
  Administered 2017-01-28: 19:00:00 via INTRAVENOUS

## 2017-01-28 MED ORDER — COCONUT OIL OIL: 1.0000 "application " | TOPICAL_OIL | Status: DC | PRN

## 2017-01-28 MED ORDER — SODIUM CHLORIDE 0.9% FLUSH
3.0000 mL | INTRAVENOUS | Status: DC | PRN
Start: 2017-01-28 — End: 2017-01-31

## 2017-01-28 MED ORDER — LACTATED RINGERS IV SOLN
500.0000 mL | Freq: Once | INTRAVENOUS | Status: AC
  Administered 2017-01-28: 500 mL via INTRAVENOUS

## 2017-01-28 MED ORDER — ONDANSETRON HCL 4 MG/2ML IJ SOLN
INTRAMUSCULAR | Status: AC
  Filled 2017-01-28: qty 2

## 2017-01-28 MED ORDER — LACTATED RINGERS IV SOLN
INTRAVENOUS | Status: DC | PRN
  Administered 2017-01-28: 40 [IU] via INTRAVENOUS

## 2017-01-28 MED ORDER — WITCH HAZEL-GLYCERIN EX PADS: 1.0000 "application " | MEDICATED_PAD | CUTANEOUS | Status: DC | PRN

## 2017-01-28 MED ORDER — KETOROLAC TROMETHAMINE 30 MG/ML IJ SOLN
30.0000 mg | Freq: Once | INTRAMUSCULAR | Status: DC | PRN
  Administered 2017-01-28: 30 mg via INTRAVENOUS

## 2017-01-28 MED ORDER — MORPHINE SULFATE (PF) 0.5 MG/ML IJ SOLN
INTRAMUSCULAR | Status: DC | PRN
  Administered 2017-01-28: 4 mg via EPIDURAL

## 2017-01-28 MED ORDER — LACTATED RINGERS IV SOLN
INTRAVENOUS | Status: DC
  Administered 2017-01-28: 11:00:00 via INTRAUTERINE

## 2017-01-28 MED ORDER — SENNOSIDES-DOCUSATE SODIUM 8.6-50 MG PO TABS
2.0000 | ORAL_TABLET | ORAL | Status: DC
  Administered 2017-01-30 (×2): 2 via ORAL
  Filled 2017-01-28 (×3): qty 2

## 2017-01-28 MED ORDER — ACETAMINOPHEN 325 MG PO TABS
650.0000 mg | ORAL_TABLET | ORAL | Status: DC | PRN
  Administered 2017-01-29 – 2017-01-30 (×4): 650 mg via ORAL
  Filled 2017-01-28 (×5): qty 2

## 2017-01-28 MED ORDER — LIDOCAINE HCL (PF) 1 % IJ SOLN
INTRAMUSCULAR | Status: DC | PRN
  Administered 2017-01-28: 5 mL via EPIDURAL
  Administered 2017-01-28: 5 mL

## 2017-01-28 MED ORDER — NALBUPHINE HCL 10 MG/ML IJ SOLN: 5.0000 mg | INTRAMUSCULAR | Status: DC | PRN

## 2017-01-28 MED ORDER — DIPHENHYDRAMINE HCL 25 MG PO CAPS: 25.0000 mg | ORAL_CAPSULE | ORAL | Status: DC | PRN

## 2017-01-28 MED ORDER — SCOPOLAMINE 1 MG/3DAYS TD PT72
MEDICATED_PATCH | TRANSDERMAL | Status: DC | PRN
  Administered 2017-01-28: 1 via TRANSDERMAL

## 2017-01-28 MED ORDER — OXYTOCIN 40 UNITS IN LACTATED RINGERS INFUSION - SIMPLE MED: 2.5000 [IU]/h | INTRAVENOUS | Status: AC

## 2017-01-28 MED ORDER — LACTATED RINGERS IV SOLN
INTRAVENOUS | Status: DC | PRN
Start: 2017-01-28 — End: 2017-01-28
  Administered 2017-01-28 (×3): via INTRAVENOUS

## 2017-01-28 MED ORDER — SCOPOLAMINE 1 MG/3DAYS TD PT72
MEDICATED_PATCH | TRANSDERMAL | Status: AC
  Filled 2017-01-28: qty 1

## 2017-01-28 MED ORDER — CEFAZOLIN SODIUM-DEXTROSE 2-4 GM/100ML-% IV SOLN
2.0000 g | Freq: Once | INTRAVENOUS | Status: AC
  Administered 2017-01-28: 2 g via INTRAVENOUS

## 2017-01-28 MED ORDER — IBUPROFEN 600 MG PO TABS
600.0000 mg | ORAL_TABLET | Freq: Four times a day (QID) | ORAL | Status: DC
  Administered 2017-01-29 – 2017-01-31 (×9): 600 mg via ORAL
  Filled 2017-01-28 (×9): qty 1

## 2017-01-28 MED ORDER — MENTHOL 3 MG MT LOZG: 1.0000 | LOZENGE | OROMUCOSAL | Status: DC | PRN

## 2017-01-28 MED ORDER — OXYCODONE HCL 5 MG PO TABS
5.0000 mg | ORAL_TABLET | ORAL | Status: DC | PRN
  Administered 2017-01-29 (×2): 5 mg via ORAL
  Filled 2017-01-28 (×2): qty 1

## 2017-01-28 MED ORDER — LIDOCAINE-EPINEPHRINE (PF) 2 %-1:200000 IJ SOLN
INTRAMUSCULAR | Status: AC
  Filled 2017-01-28: qty 20

## 2017-01-28 MED ORDER — SIMETHICONE 80 MG PO CHEW
80.0000 mg | CHEWABLE_TABLET | ORAL | Status: DC
  Administered 2017-01-29 – 2017-01-30 (×3): 80 mg via ORAL
  Filled 2017-01-28 (×3): qty 1

## 2017-01-28 MED ORDER — SCOPOLAMINE 1 MG/3DAYS TD PT72
1.0000 | MEDICATED_PATCH | Freq: Once | TRANSDERMAL | Status: DC
  Filled 2017-01-28: qty 1

## 2017-01-28 MED ORDER — NALOXONE HCL 0.4 MG/ML IJ SOLN: 0.4000 mg | INTRAMUSCULAR | Status: DC | PRN

## 2017-01-28 MED ORDER — ONDANSETRON HCL 4 MG/2ML IJ SOLN
INTRAMUSCULAR | Status: DC | PRN
  Administered 2017-01-28: 4 mg via INTRAVENOUS

## 2017-01-28 MED ORDER — OXYTOCIN 10 UNIT/ML IJ SOLN
INTRAMUSCULAR | Status: AC
  Filled 2017-01-28: qty 4

## 2017-01-28 MED ORDER — TETANUS-DIPHTH-ACELL PERTUSSIS 5-2.5-18.5 LF-MCG/0.5 IM SUSP: 0.5000 mL | Freq: Once | INTRAMUSCULAR | Status: DC

## 2017-01-28 MED ORDER — KETOROLAC TROMETHAMINE 30 MG/ML IJ SOLN
INTRAMUSCULAR | Status: AC
  Administered 2017-01-28: 30 mg via INTRAVENOUS
  Filled 2017-01-28: qty 1

## 2017-01-28 MED ORDER — MORPHINE SULFATE (PF) 0.5 MG/ML IJ SOLN
INTRAMUSCULAR | Status: AC
  Filled 2017-01-28: qty 10

## 2017-01-28 MED ORDER — HYDROMORPHONE HCL 1 MG/ML IJ SOLN: 0.2500 mg | INTRAMUSCULAR | Status: DC | PRN

## 2017-01-28 MED ORDER — LACTATED RINGERS IV SOLN
INTRAVENOUS | Status: DC
  Administered 2017-01-29 (×2): via INTRAVENOUS

## 2017-01-28 MED ORDER — DIBUCAINE 1 % RE OINT: 1.0000 "application " | TOPICAL_OINTMENT | RECTAL | Status: DC | PRN

## 2017-01-28 MED ORDER — PROMETHAZINE HCL 25 MG/ML IJ SOLN: 6.2500 mg | INTRAMUSCULAR | Status: DC | PRN

## 2017-01-28 MED ORDER — SIMETHICONE 80 MG PO CHEW
80.0000 mg | CHEWABLE_TABLET | Freq: Three times a day (TID) | ORAL | Status: DC
  Administered 2017-01-29 – 2017-01-31 (×7): 80 mg via ORAL
  Filled 2017-01-28 (×7): qty 1

## 2017-01-28 MED ORDER — NALOXONE HCL 2 MG/2ML IJ SOSY: 1.0000 ug/kg/h | PREFILLED_SYRINGE | INTRAVENOUS | Status: DC | PRN

## 2017-01-28 MED ORDER — ONDANSETRON HCL 4 MG/2ML IJ SOLN: 4.0000 mg | Freq: Three times a day (TID) | INTRAMUSCULAR | Status: DC | PRN

## 2017-01-28 SURGICAL SUPPLY — 47 items
BARRIER ADHS 3X4 INTERCEED (GAUZE/BANDAGES/DRESSINGS) ×3 IMPLANT
BENZOIN TINCTURE PRP APPL 2/3 (GAUZE/BANDAGES/DRESSINGS) ×3 IMPLANT
CHLORAPREP W/TINT 26ML (MISCELLANEOUS) ×3 IMPLANT
CLAMP CORD UMBIL (MISCELLANEOUS) IMPLANT
CLOSURE STERI-STRIP 1/2X4 (GAUZE/BANDAGES/DRESSINGS) ×1
CLOSURE WOUND 1/2 X4 (GAUZE/BANDAGES/DRESSINGS) ×1
CLOTH BEACON ORANGE TIMEOUT ST (SAFETY) ×3 IMPLANT
CLSR STERI-STRIP ANTIMIC 1/2X4 (GAUZE/BANDAGES/DRESSINGS) ×2 IMPLANT
DERMABOND ADVANCED (GAUZE/BANDAGES/DRESSINGS)
DERMABOND ADVANCED .7 DNX12 (GAUZE/BANDAGES/DRESSINGS) IMPLANT
DRSG OPSITE POSTOP 4X10 (GAUZE/BANDAGES/DRESSINGS) ×3 IMPLANT
ELECT REM PT RETURN 9FT ADLT (ELECTROSURGICAL) ×3
ELECTRODE REM PT RTRN 9FT ADLT (ELECTROSURGICAL) ×1 IMPLANT
EXTRACTOR VACUUM KIWI (MISCELLANEOUS) IMPLANT
GLOVE BIOGEL PI IND STRL 6.5 (GLOVE) ×1 IMPLANT
GLOVE BIOGEL PI IND STRL 7.0 (GLOVE) ×2 IMPLANT
GLOVE BIOGEL PI INDICATOR 6.5 (GLOVE) ×2
GLOVE BIOGEL PI INDICATOR 7.0 (GLOVE) ×4
GLOVE ECLIPSE 6.5 STRL STRAW (GLOVE) ×3 IMPLANT
GOWN STRL REUS W/TWL LRG LVL3 (GOWN DISPOSABLE) ×9 IMPLANT
HOVERMATT SINGLE USE (MISCELLANEOUS) ×3 IMPLANT
KIT ABG SYR 3ML LUER SLIP (SYRINGE) IMPLANT
NEEDLE HYPO 25X5/8 SAFETYGLIDE (NEEDLE) IMPLANT
NS IRRIG 1000ML POUR BTL (IV SOLUTION) ×3 IMPLANT
PACK C SECTION WH (CUSTOM PROCEDURE TRAY) ×3 IMPLANT
PAD ABD 7.5X8 STRL (GAUZE/BANDAGES/DRESSINGS) ×3 IMPLANT
PAD ABD DERMACEA PRESS 5X9 (GAUZE/BANDAGES/DRESSINGS) ×3 IMPLANT
PAD OB MATERNITY 4.3X12.25 (PERSONAL CARE ITEMS) ×3 IMPLANT
PENCIL SMOKE EVAC W/HOLSTER (ELECTROSURGICAL) ×3 IMPLANT
RETRACTOR TRAXI PANNICULUS (MISCELLANEOUS) ×1 IMPLANT
RTRCTR C-SECT PINK 25CM LRG (MISCELLANEOUS) ×3 IMPLANT
STRIP CLOSURE SKIN 1/2X4 (GAUZE/BANDAGES/DRESSINGS) ×2 IMPLANT
SUT PLAIN 0 NONE (SUTURE) IMPLANT
SUT PLAIN 2 0 (SUTURE) ×2
SUT PLAIN 2 0 XLH (SUTURE) IMPLANT
SUT PLAIN ABS 2-0 CT1 27XMFL (SUTURE) ×1 IMPLANT
SUT VIC AB 0 CT1 27 (SUTURE) ×4
SUT VIC AB 0 CT1 27XBRD ANBCTR (SUTURE) ×2 IMPLANT
SUT VIC AB 0 CTX 36 (SUTURE) ×6
SUT VIC AB 0 CTX36XBRD ANBCTRL (SUTURE) ×3 IMPLANT
SUT VIC AB 2-0 CT1 27 (SUTURE) ×2
SUT VIC AB 2-0 CT1 TAPERPNT 27 (SUTURE) ×1 IMPLANT
SUT VIC AB 4-0 KS 27 (SUTURE) ×3 IMPLANT
TAPE CLOTH SURG 4X10 WHT LF (GAUZE/BANDAGES/DRESSINGS) ×3 IMPLANT
TOWEL OR 17X24 6PK STRL BLUE (TOWEL DISPOSABLE) ×3 IMPLANT
TRAXI PANNICULUS RETRACTOR (MISCELLANEOUS) ×2
TRAY FOLEY BAG SILVER LF 14FR (SET/KITS/TRAYS/PACK) IMPLANT

## 2017-01-28 NOTE — Anesthesia Postprocedure Evaluation (Signed)
Anesthesia Post Note  Patient: Brooke Casey  Procedure(s) Performed: Procedure(s) (LRB): CESAREAN SECTION (N/A)     Patient location during evaluation: Mother Baby Anesthesia Type: Epidural Level of consciousness: awake and alert and oriented Pain management: satisfactory to patient Vital Signs Assessment: post-procedure vital signs reviewed and stable Respiratory status: respiratory function stable Cardiovascular status: stable Postop Assessment: no headache, no backache, epidural receding, patient able to bend at knees, no signs of nausea or vomiting and adequate PO intake Anesthetic complications: no    Last Vitals:  Vitals:   01/28/17 2115 01/28/17 2129  BP: 127/73 115/64  Pulse: 92 88  Resp: 17 18  Temp: 37.7 C 37 C  SpO2: 98% 97%    Last Pain:  Vitals:   01/28/17 2130  TempSrc:   PainSc: 1    Pain Goal:                 Xaiden Fleig

## 2017-01-28 NOTE — Progress Notes (Signed)
  Subjective: Comfortable with epidural--aware of some pressure in vagina/rectum, but not uncomfortable.  Objective: BP 128/87   Pulse 94   Temp 98.6 F (37 C) (Oral)   Resp 18   SpO2 100%  No intake/output data recorded. No intake/output data recorded.   Vitals:   01/28/17 0850 01/28/17 0855 01/28/17 0900 01/28/17 0930  BP: 120/81 116/79 119/83 128/87  Pulse: 93 91 91 94  Resp:   18   Temp:      TempSrc:      SpO2:          FHT: Category 2--occasional late decels, occasional variables, positive scalp stim. UC:  Irregular, q 2-4 min. SVE:   Dilation: 5 Effacement (%): 80 Station: -2 Exam by:: Manfred ArchV. Derwood Becraft CNM  BBOW, vtx well-applied to cervix. AROM--clear fluid, moderate amount Pitocin at 14 mu/min IUPC and FSE inserted easily. Foley cath placed without difficulty.  Assessment:  Induction for post-dates, velamentous insertion.  Plan: Decrease pitocin to 7 mu/min. Close observation of FHR status. Continue augmentation to establish/maintain adequacy. PCR from foley--if positive, will check PIH labs.  Brooke Casey, Brooke Casey CNM 01/28/2017, 9:40 AM

## 2017-01-28 NOTE — Progress Notes (Signed)
  Subjective: Comfortable with epidural.  Family at bedside.  Objective: BP 121/61   Pulse 79   Temp 98 F (36.7 C) (Oral)   Resp 18   SpO2 100%  No intake/output data recorded. Total I/O In: -  Out: 2875 [Urine:2875]   Vitals:   01/28/17 1430 01/28/17 1500 01/28/17 1530 01/28/17 1600  BP: 122/69 114/65 118/68 121/61  Pulse: 80 82 83 79  Resp:  18  18  Temp:    98 F (36.7 C)  TempSrc:    Oral  SpO2:        FHT: Category 1 UC:   regular, every 2-3 minutes SVE:   Dilation: 6.5 Effacement (%): 90 Station: -1 Exam by:: Manfred ArchV. Natayah Warmack CNM  Cervix tight around os, but stretchable. Pitocin at 14 mu/min MVUs 110-120--IUPC adjusted and re-zeroed.  Results for orders placed or performed during the hospital encounter of 01/27/17 (from the past 24 hour(s))  Protein / creatinine ratio, urine     Status: Abnormal   Collection Time: 01/28/17  9:45 AM  Result Value Ref Range   Creatinine, Urine 31.00 mg/dL   Total Protein, Urine 16 mg/dL   Protein Creatinine Ratio 0.52 (H) 0.00 - 0.15 mg/mg[Cre]  Comprehensive metabolic panel     Status: Abnormal   Collection Time: 01/28/17  2:00 PM  Result Value Ref Range   Sodium 138 135 - 145 mmol/L   Potassium 3.6 3.5 - 5.1 mmol/L   Chloride 106 101 - 111 mmol/L   CO2 23 22 - 32 mmol/L   Glucose, Bld 82 65 - 99 mg/dL   BUN 7 6 - 20 mg/dL   Creatinine, Ser 4.090.56 0.44 - 1.00 mg/dL   Calcium 8.9 8.9 - 81.110.3 mg/dL   Total Protein 7.0 6.5 - 8.1 g/dL   Albumin 2.7 (L) 3.5 - 5.0 g/dL   AST 19 15 - 41 U/L   ALT 16 14 - 54 U/L   Alkaline Phosphatase 195 (H) 38 - 126 U/L   Total Bilirubin 0.5 0.3 - 1.2 mg/dL   GFR calc non Af Amer >60 >60 mL/min   GFR calc Af Amer >60 >60 mL/min   Anion gap 9 5 - 15  Uric acid     Status: None   Collection Time: 01/28/17  2:00 PM  Result Value Ref Range   Uric Acid, Serum 6.5 2.3 - 6.6 mg/dL  Lactate dehydrogenase     Status: None   Collection Time: 01/28/17  2:00 PM  Result Value Ref Range   LDH 120 98 -  192 U/L  CBC     Status: None   Collection Time: 01/28/17  2:00 PM  Result Value Ref Range   WBC 9.3 4.0 - 10.5 K/uL   RBC 4.09 3.87 - 5.11 MIL/uL   Hemoglobin 12.9 12.0 - 15.0 g/dL   HCT 91.436.8 78.236.0 - 95.646.0 %   MCV 90.0 78.0 - 100.0 fL   MCH 31.5 26.0 - 34.0 pg   MCHC 35.1 30.0 - 36.0 g/dL   RDW 21.313.5 08.611.5 - 57.815.5 %   Platelets 213 150 - 400 K/uL    Assessment:  Induction for postdates and velamentous insertion Active labor Inadequate UCs Possible LGA per abdominal palpation Elevated PCR--mild pre-eclampsia  Plan: Continue augmentation to establish/maintain adequacy.  Nigel BridgemanLATHAM, Noya Santarelli CNM 01/28/2017, 4:31 PM

## 2017-01-28 NOTE — Anesthesia Preprocedure Evaluation (Addendum)
Anesthesia Evaluation  Patient identified by MRN, date of birth, ID band Patient awake    Reviewed: Allergy & Precautions, H&P , NPO status , Patient's Chart, lab work & pertinent test results  History of Anesthesia Complications (+) history of anesthetic complications  Airway Mallampati: II  TM Distance: >3 FB Neck ROM: Full    Dental no notable dental hx. (+) Teeth Intact   Pulmonary neg pulmonary ROS,    Pulmonary exam normal breath sounds clear to auscultation       Cardiovascular negative cardio ROS Normal cardiovascular exam Rhythm:Regular Rate:Normal     Neuro/Psych negative neurological ROS  negative psych ROS   GI/Hepatic negative GI ROS, Neg liver ROS,   Endo/Other  negative endocrine ROS  Renal/GU negative Renal ROS  negative genitourinary   Musculoskeletal negative musculoskeletal ROS (+)   Abdominal   Peds negative pediatric ROS (+)  Hematology negative hematology ROS (+)   Anesthesia Other Findings   Reproductive/Obstetrics negative OB ROS (+) Pregnancy                             Anesthesia Physical Anesthesia Plan  ASA: II  Anesthesia Plan: Epidural   Post-op Pain Management:    Induction: Intravenous  PONV Risk Score and Plan: 2 and Ondansetron and Treatment may vary due to age or medical condition  Airway Management Planned:   Additional Equipment:   Intra-op Plan:   Post-operative Plan:   Informed Consent: I have reviewed the patients History and Physical, chart, labs and discussed the procedure including the risks, benefits and alternatives for the proposed anesthesia with the patient or authorized representative who has indicated his/her understanding and acceptance.   Dental advisory given  Plan Discussed with: CRNA  Anesthesia Plan Comments:        Anesthesia Quick Evaluation

## 2017-01-28 NOTE — Anesthesia Procedure Notes (Signed)
Epidural Patient location during procedure: OB Start time: 01/28/2017 8:10 AM End time: 01/28/2017 8:15 AM  Staffing Anesthesiologist: Valeri Sula  Preanesthetic Checklist Completed: patient identified, site marked, surgical consent, pre-op evaluation, timeout performed, IV checked, risks and benefits discussed and monitors and equipment checked  Epidural Patient position: sitting Prep: site prepped and draped and DuraPrep Patient monitoring: continuous pulse ox and blood pressure Approach: midline Location: L4-L5 Injection technique: LOR air  Needle:  Needle type: Tuohy  Needle gauge: 17 G Needle length: 9 cm and 9 Needle insertion depth: 5 cm cm Catheter type: closed end flexible Catheter size: 19 Gauge Catheter at skin depth: 10 cm Test dose: negative  Assessment Events: blood not aspirated, injection not painful, no injection resistance, negative IV test and no paresthesia

## 2017-01-28 NOTE — Progress Notes (Signed)
  Subjective: RN notified me of 3 late decels.  Objective: BP 112/64   Pulse 89   Temp 97.9 F (36.6 C) (Oral)   Resp 20   SpO2 100%  No intake/output data recorded. Total I/O In: -  Out: 325 [Urine:325]  FHT: Category 2--intermittent late decels, segments of decreased variability, with segments of moderate. UC:   regular, every 3-5 minutes SVE:   Dilation: 5 Effacement (%): 80 Station: -2 Exam by:: Brooke Casey CNM at [phone] Pitocin at 7 mu/min at present--decreased to 2 mu/min  Assessment:  Induction for postdates, velamentous insertion Inadequate labor  Plan: Position change, amnio infusion, pit down to 2 mu/min. Close observation of FHR status.  Brooke Casey CNM 01/28/2017, 11:00 AM

## 2017-01-28 NOTE — Progress Notes (Addendum)
  Subjective: Comfortable with epidural.  Denies HA, visual sx, or epigastric pain.  Objective: BP 127/67   Pulse 76   Temp 98.3 F (36.8 C) (Oral)   Resp 18   SpO2 100%  No intake/output data recorded. Total I/O In: -  Out: 1175 [Urine:1175]   Vitals:   01/28/17 1100 01/28/17 1130 01/28/17 1200 01/28/17 1230  BP: 113/60 102/72 115/82 127/67  Pulse: 85 82 87 76  Resp: 20  18   Temp:   98.3 F (36.8 C)   TempSrc:   Oral   SpO2:        FHT: Category 1--tracing improved after amnioinfusion, position change, decrease in pitocin.  Cycles of moderate variability interspersed with decreased variability. UC:   irregular, every 6 minutes SVE:   Dilation: 5 Effacement (%): 80 Station: -2 Exam by:: Brooke Casey CNM at 0940 Pitocin at 2 mu/min MVUs 100-110  PCR 0.52  Assessment:  Induction for postdates, velamentous insertion Episode of elevated BP prior to epidural, now normotensive. Elevated PCR  Plan: Start to increase pitocin again due to improved tracing. PIH labs.  Brooke Casey, Brooke Casey CNM 01/28/2017, 12:32 PM

## 2017-01-28 NOTE — Progress Notes (Signed)
  Subjective: Slept at intervals, received 2nd dose Fentanyl at 7am.  Aware now of painful contraction.  Objective: BP 123/90   Pulse 91   Temp 98.6 F (37 C) (Oral)   Resp 20  No intake/output data recorded. No intake/output data recorded.   Vitals:   01/28/17 0630 01/28/17 0700 01/28/17 0730 01/28/17 0742  BP: 129/81 130/88 123/90   Pulse: 91 92 91   Resp: 18 18  20   Temp:    98.6 F (37 C)  TempSrc:    Oral    FHT: Category 1 UC:   regular, every 3 minutes SVE:   Dilation: 5 Effacement (%): 50 Station: -3 Exam by:: Emly, CNM at 0104 Pitocin at 14 mu/min  Assessment:  Induction for postdates, velamentous insertion. Marginal insertion GBS negative Dilated umbilical vein Hx HSV 1 & 2 titer, no recent/current lesions. BMI 41 Shellfish allergy  Plan: Reviewed status with patient--recommend epidural now, then assess for AROM when comfortable. Plan internal monitoring Check PCR when foley placed.  Nigel BridgemanLATHAM, Marena Witts CNM 01/28/2017, 8:11 AM

## 2017-01-28 NOTE — Transfer of Care (Signed)
Immediate Anesthesia Transfer of Care Note  Patient: Brooke Casey  Procedure(s) Performed: Procedure(s): CESAREAN SECTION (N/A)  Patient Location: PACU  Anesthesia Type:Epidural  Level of Consciousness: awake  Airway & Oxygen Therapy: Patient Spontanous Breathing  Post-op Assessment: Report given to RN and Post -op Vital signs reviewed and stable  Post vital signs: stable  Last Vitals:  Vitals:   01/28/17 1800 01/28/17 1830  BP: 112/73 122/84  Pulse: 91 (!) 102  Resp: 20   Temp: 37.1 C   SpO2:      Last Pain:  Vitals:   01/28/17 1800  TempSrc: Oral  PainSc: 0-No pain         Complications: No apparent anesthesia complications

## 2017-01-28 NOTE — Progress Notes (Signed)
Sande Brothersaima Felan MRN: [id number]  Subjective: -Patient resting in bed.  Reports that she was able to get some sleep, but feels it was intermittent.  Continues to deny perception of contractions.    Objective: BP 107/60   Pulse 83   Temp 98.8 F (37.1 C) (Oral)   Resp 18  No intake/output data recorded. No intake/output data recorded.  Fetal Monitoring: FHT: 145 bpm, Mod Var, -Decels, +Accels UC: Q1-544min    Vaginal Exam: SVE:   Dilation: 5 Effacement (%): 50 Station: -3 Exam by:: State Street CorporationEmly, CNM Membranes:Intact Internal Monitors: None  Augmentation/Induction: Pitocin:18mUn/min Cytotec: S/p 2 Doses S/P Foley Bulb  Assessment:  IUP at 41wks Cat I FT  Post Dates IOL  Plan: -Fetal station remains high and patient uncomfortable with exam.  AROM deferred -Will reassess at 0600 unless necessary before -Informed patient that unless significant change has occurred (i.e fetal tracing, perception of contractions, or SROM) exam will be deferred at 0600 -No q/c -Continue other mgmt as ordered   Valma CavaJessica L Johathon Overturf,MSN, CNM 01/28/2017, 12:58 AM

## 2017-01-28 NOTE — Op Note (Signed)
PreOp Diagnosis:  Intrauterine pregnancy @ 3483w0d Failure to progress Morbid obesity: BMI 41 Velamentous cord HSV  PostOp Diagnosis: same Procedure: Primary C-section Surgeon: Dr. Myna HidalgoJennifer Wakeelah Solan Assistant: Bernerd PhoNancy Prothero Anesthesia: epidural Complications: none EBL: 801cc UOP: 250cc Fluids: 3000cc  Findings: Female infant from vertex presentation, normal uterus tubes and ovaries bilaterally.  PROCEDURE:  Informed consent was obtained from the patient with risks, benefits, complications, treatment options, and expected outcomes discussed with the patient.  The patient concurred with the proposed plan, giving informed consent with form signed.   The patient was taken to Operating Room, and identified with the procedure verified as C-Section Delivery with Time Out. With induction of anesthesia, the patient was prepped and draped in the usual sterile fashion. A Pfannenstiel incision was made and carried down through the subcutaneous tissue to the fascia. The fascia was incised in the midline and extended transversely. The superior aspect of the fascial incision was grasped with Kochers elevated and the underlying muscle dissected off. The inferior aspect of the facial incision was in similar fashion, grasped elevated and rectus muscles dissected off. The peritoneum was identified and entered. Peritoneal incision was extended longitudinally.  Alexis retractor was placed. The utero-vesical peritoneal reflection was identified and incised transversely with the [facility] scissors, the incision extended laterally, the bladder flap created digitally. A low transverse uterine incision was made and the infants head delivered atraumatically. After the umbilical cord was clamped and cut cord blood was obtained for evaluation.   The placenta was removed intact and appeared normal. The uterine outline, tubes and ovaries appeared normal. The uterine incision was closed with running locked sutures of 0 Vicryl and a  second layer of the same stitch was used in an imbricating fashion.  Excellent hemostasis was obtained.  The pericolic gutters were then cleared of all clots and debris. Interceed was placed.  The peritoneum was closed in a running fashion. The fascia was then reapproximated with running sutures of 0 Vicryl. The subcutaneous tissue was reapproximated with 2-0 plain gut suture.   The skin was closed with 4-0 vicryl in a subcuticular fashion.  Instrument, sponge, and needle counts were correct prior the abdominal closure and at the conclusion of the case. The patient was taken to recovery in stable condition.  Myna HidalgoJennifer Fernand Sorbello, DO [phone] (pager) [phone] (office)

## 2017-01-28 NOTE — Progress Notes (Signed)
Sande Brothersaima Robello MRN: [id number]  Subjective: -In room to assess, but patient asleep with lights and television off.  Did not attempt to awaken.    Objective: BP 123/80   Pulse 85   Temp 98.5 F (36.9 C) (Oral)   Resp 19  No intake/output data recorded. No intake/output data recorded.  Fetal Monitoring: FHT: 145 bpm, Min Var, -Decels, +Accels UC: Q1-182min    Vaginal Exam: SVE:   Deferred Membranes:Intact Internal Monitors: None  Augmentation/Induction: Pitocin:7414mUn/min  Cytotec: S/P 2 Doses S/P Foley Bulb  Assessment:  IUP at 41wks Cat I FT  IOL  Plan: -Update to be given to A. Su Hiltoberts, MD -Report to be given to oncoming provider-V.Emilee HeroLatham, CNM -Continue other mgmt as ordered   Valma CavaJessica L Cherylee Rawlinson,MSN, CNM 01/28/2017, 6:23 AM

## 2017-01-28 NOTE — Progress Notes (Addendum)
  Subjective: Comfortable with epidural.  Objective: BP 112/73   Pulse 91   Temp 98.7 F (37.1 C) (Oral)   Resp 20   SpO2 100%  No intake/output data recorded. Total I/O In: -  Out: 3925 [Urine:3925]  FHT: Category 2--decreased variability, no decels. UC:   regular, every 2 minutes SVE:   Dilation: 6.5 Effacement (%): 90 Station: -1 Exam by:: Manfred ArchV. Latham CNM  Cervix edematous, no further descent Pitocin at   Assessment:  Arrest of active phase GBS negative Possible LGA Velamentous insertion Dilated umbilical vein Shellfish allergy BMI 41  Plan: Recommended C/S due to arrest of active phase.  Consulted with Dr. Charlotta Newtonzan, who concurs. Risks and benefits of cesarean were reviewed with patient and family, including anesthesia, bleeding, infection, and damage to other organs.  Patient and family seem to understand these risks and are in agreement with proceeding with cesarean. Dr. Charlotta Newtonzan planning Ancef and Azithromycin as pre-op meds.  Nigel BridgemanLATHAM, VICKI CNM 01/28/2017, 6:24 PM  At bedside to review above.  Pt wishes to proceed with C-section due to failure to progress.  Risk benefits and alternatives of cesarean section were discussed with the patient including but not limited to infection, bleeding, damage to bowel , bladder and baby with the need for further surgery. Pt voiced understanding and desires to proceed. Consent obtained.  [name], DO [phone] (pager) [phone] (office)

## 2017-01-29 ENCOUNTER — Encounter (HOSPITAL_COMMUNITY): Payer: Self-pay | Admitting: Obstetrics & Gynecology

## 2017-01-29 LAB — CBC
HCT: 29.4 % — ABNORMAL LOW (ref 36.0–46.0)
Hemoglobin: 10.6 g/dL — ABNORMAL LOW (ref 12.0–15.0)
MCH: 31.7 pg (ref 26.0–34.0)
MCHC: 36.1 g/dL — ABNORMAL HIGH (ref 30.0–36.0)
MCV: 88 fL (ref 78.0–100.0)
Platelets: 188 10*3/uL (ref 150–400)
RBC: 3.34 MIL/uL — ABNORMAL LOW (ref 3.87–5.11)
RDW: 13.3 % (ref 11.5–15.5)
WBC: 10.3 10*3/uL (ref 4.0–10.5)

## 2017-01-29 NOTE — Anesthesia Postprocedure Evaluation (Signed)
Anesthesia Post Note  Patient: Brooke Casey  Procedure(s) Performed: Procedure(s) (LRB): CESAREAN SECTION (N/A)     Patient location during evaluation: Mother Baby Anesthesia Type: Epidural Level of consciousness: awake and alert and oriented Pain management: pain level controlled Vital Signs Assessment: post-procedure vital signs reviewed and stable Respiratory status: spontaneous breathing and nonlabored ventilation Cardiovascular status: stable Postop Assessment: no headache, no backache, patient able to bend at knees, no signs of nausea or vomiting and adequate PO intake Anesthetic complications: no    Last Vitals:  Vitals:   01/29/17 0109 01/29/17 0458  BP: (!) 126/92 (!) 110/57  Pulse: 100 100  Resp: 20 18  Temp:  37.3 C  SpO2:  97%    Last Pain:  Vitals:   01/29/17 0511  TempSrc:   PainSc: 0-No pain   Pain Goal:                 Madison HickmanGREGORY,[name]

## 2017-01-29 NOTE — Progress Notes (Addendum)
Brooke Casey [id number]  Subjective: Postpartum Day 1: Prmiary C/S due to failure to progress Patient up ad lib, reports no syncope or dizziness. Feeding:  BF   Objective: Temp:  [97.8 F (36.6 C)-99.9 F (37.7 C)] 98.3 F (36.8 C) (09/01 0900) Pulse Rate:  [76-106] 99 (09/01 0900) Resp:  [16-22] 16 (09/01 0900) BP: (110-142)/(57-96) 123/63 (09/01 0900) SpO2:  [96 %-100 %] 98 % (09/01 0900)  CBC Latest Ref Rng & Units 01/29/2017 01/28/2017 01/27/2017  WBC 4.0 - 10.5 K/uL 10.3 9.3 7.9  Hemoglobin 12.0 - 15.0 g/dL 10.6(L) 12.9 12.5  Hematocrit 36.0 - 46.0 % 29.4(L) 36.8 35.1(L)  Platelets 150 - 400 K/uL 188 213 257     Physical Exam:  General: alert, cooperative and moderately obese Lochia: appropriate Uterine Fundus: firm Abdomen:  + bowel sounds, pos Incision:  Microfoam tape dressing CDI DVT Evaluation: No evidence of DVT seen on physical exam.   Assessment/Plan: Status post cesarean delivery, day 1. Stable Continue current care. Breastfeeding and Circumcision prior to discharge  IP Circ desired    Rhea PinkLori A Mayme Profeta MSN, CNM 01/29/2017, 11:35 AM

## 2017-01-29 NOTE — Lactation Note (Signed)
This note was copied from a baby's chart. Lactation Consultation Note  Patient Name: Brooke Casey RUEAV'WToday's Date: 01/29/2017 Reason for consult: Follow-up assessment   With this first time mom and term baby, now 8118 hours old. The baby fed well last 8 hours ago, but mom has attempted every 3 hours.  Mom has large, soft breast, with evert nipples, but baby has been sleepy, and once latched loses his latch easily.  At first I tried side lying, but this did not work Angus PalmsKai could not grasp the nipple - it would disappear into her soft breast tissue.  Mom then tried football hold. He latched better, with strong , rhythmic suckles and very good breast movement. He was still feeding when I left the room.  I did try a 24 nipple shield, but this did not help. He did better without shield on, once he began to root and open his mouth wide.  Mom is to use hand pump and HE after each feeding, at least every 3 hours. The HE colostrum stimulates him to suckle well. She may need a DEP set up, if he continues to have trouble latching.  I reviewed hand expression with mom. It is difficult with her large breast, but mom was able to express about 0.5 mls on her own. Mom knows to call for questions/concerns.    Maternal Data    Feeding Feeding Type: Breast Fed Length of feed: 15 min (startedactively sucking at 1430)  LATCH Score Latch: Repeated attempts needed to sustain latch, nipple held in mouth throughout feeding, stimulation needed to elicit sucking reflex.  Audible Swallowing: None  Type of Nipple: Everted at rest and after stimulation  Comfort (Breast/Nipple): Soft / non-tender  Hold (Positioning): Assistance needed to correctly position infant at breast and maintain latch.  LATCH Score: 6  Interventions Interventions: Breast feeding basics reviewed;Assisted with latch;Skin to skin;Hand express;Pre-pump if needed;Adjust position;Support pillows;Position options;Expressed milk;Hand pump  Lactation  Tools Discussed/Used     Consult Status Consult Status: Follow-up Date: 01/30/17 Follow-up type: In-patient    Alfred LevinsLee, Erico Stan Anne 01/29/2017, 2:46 PM

## 2017-01-29 NOTE — Lactation Note (Signed)
This note was copied from a baby's chart. Lactation Consultation Note New mom worried she doesn't have anything for baby to eat. Mom has LARGE pendulum breast, everted nipple. Demonstrated breast massage and hand expression. Collect drops of thick colostrum. Explained several times baby belly size and amount needed per feeding. Mom demonstrated hand expression. Mom cont. To think she should have milk pouring out of breast or the baby will go hungry. Educated [name] storage.  RN gave hand pump, LC demonstrated how to use hand pump. Explained to mom normal not to get anything when pumped, showed mom thick colostrum.  Mom encouraged to feed baby 8-12 times/24 hours and with feeding cues. Educated newborn behavior, STS, I&O, cluster feeding, supply and demand.  Encouraged mom to rest while baby is rest. Instructed to wake baby for feeding if hasn't woke up in 3 hours.  [facility] brochure given w/resources, support groups and LC services. Patient Name: [name] NGEXB'MToday's Date: 01/29/2017 Reason for consult: Initial assessment   Maternal Data Has patient been taught Hand Expression?: Yes Does the patient have breastfeeding experience prior to this delivery?: No  Feeding Feeding Type: Breast Fed Length of feed: 25 min (on 45 minutes)  LATCH Score Latch: Repeated attempts needed to sustain latch, nipple held in mouth throughout feeding, stimulation needed to elicit sucking reflex.  Audible Swallowing: A few with stimulation  Type of Nipple: Everted at rest and after stimulation  Comfort (Breast/Nipple): Soft / non-tender  Hold (Positioning): Assistance needed to correctly position infant at breast and maintain latch.  LATCH Score: 7  Interventions Interventions: Breast feeding basics reviewed;Support pillows;Position options;Breast massage;Hand express;Pre-pump if needed;Hand pump;Breast compression  Lactation Tools Discussed/Used Tools: Pump Breast pump type: Manual Pump  Review: Setup, frequency, and cleaning;Milk Storage Initiated by:: RN/[facility] Date initiated:: 01/29/17   Consult Status Consult Status: Follow-up Date: 01/29/17 Follow-up type: In-patient    Brooke Casey, Brooke NickelLAURA G 01/29/2017, 1:49 AM

## 2017-01-29 NOTE — Addendum Note (Signed)
Addendum  created 01/29/17 0830 by Shanon PayorGregory, Alazia Crocket M, CRNA   Sign clinical note

## 2017-01-30 NOTE — Plan of Care (Signed)
Problem: Pain Management: Goal: General experience of comfort will improve and pain level will decrease Outcome: Completed/Met Date Met: 01/30/17 Pt reported pain of 8/10 on initial assessment.  Pt reported pain was at incision site and was soreness/discomfort that gets worse with activity.  Tylenol and Oxycodone given and pt reported that pain was well controlled with the medications and the pt was able to shower and move around better in the room.  Problem: Urinary Elimination: Goal: Ability to reestablish a normal urinary elimination pattern will improve Outcome: Completed/Met Date Met: 01/30/17 Pt voiding post catheter removal without difficulty.

## 2017-01-30 NOTE — Progress Notes (Signed)
Pt's honeycomb dressing noted to no longer be intact along the bottom and appeared to be saturated with water from the patient's shower.  Lori Clemmons CNM notified and gave order to change dressing.  Sterile technique used.

## 2017-01-30 NOTE — Progress Notes (Signed)
Sande Brothersaima Mcgilvery [id number]  Subjective: Postpartum Day 2: Primary C/S  Patient up ad lib, reports no syncope or dizziness. Feeding:  BF   Objective: Temp:  [98 F (36.7 C)-98.3 F (36.8 C)] 98.3 F (36.8 C) (09/02 0536) Pulse Rate:  [89-90] 89 (09/02 0536) Resp:  [16-18] 18 (09/02 0536) BP: (113-125)/(71-73) 113/73 (09/02 0536) SpO2:  [98 %] 98 % (09/01 1700)  CBC Latest Ref Rng & Units 01/29/2017 01/28/2017 01/27/2017  WBC 4.0 - 10.5 K/uL 10.3 9.3 7.9  Hemoglobin 12.0 - 15.0 g/dL 10.6(L) 12.9 12.5  Hematocrit 36.0 - 46.0 % 29.4(L) 36.8 35.1(L)  Platelets 150 - 400 K/uL 188 213 257     Physical Exam:  General: alert, cooperative and appears stated age Lochia: appropriate Uterine Fundus: firm Abdomen:  + bowel sounds, NT Incision: Microfoam dressing CDI DVT Evaluation: No evidence of DVT seen on physical exam.   Assessment/Plan: Status post cesarean delivery, day 2. Stable Continue current care. Circ was done today Plan for discharge tomorrow and Breastfeeding    Rhea PinkLori A Mahonri Seiden MSN, CNM 01/30/2017, 2:40 PM

## 2017-01-31 DIAGNOSIS — O1400 Mild to moderate pre-eclampsia, unspecified trimester: Secondary | ICD-10-CM

## 2017-01-31 DIAGNOSIS — Z98891 History of uterine scar from previous surgery: Secondary | ICD-10-CM

## 2017-01-31 MED ORDER — OXYCODONE HCL 5 MG PO TABS: 5.0000 mg | ORAL_TABLET | ORAL | 0 refills | Status: DC | PRN

## 2017-01-31 MED ORDER — IBUPROFEN 600 MG PO TABS: 600.0000 mg | ORAL_TABLET | Freq: Four times a day (QID) | ORAL | 0 refills | Status: DC

## 2017-01-31 NOTE — Discharge Instructions (Signed)
Home Care Instructions for Mom °ACTIVITY °· Gradually return to your regular activities. °· Let yourself rest. Nap while your baby sleeps. °· Avoid lifting anything that is heavier than 10 lb (4.5 kg) until your health care provider says it is okay. °· Avoid activities that take a lot of effort and energy (are strenuous) until approved by your health care provider. Walking at a slow-to-moderate pace is usually safe. °· If you had a cesarean delivery: °? Do not vacuum, climb stairs, or drive a car for 4-6 weeks. °? Have someone help you at home until you feel like you can do your usual activities yourself. °? Do exercises as told by your health care provider, if this applies. ° °VAGINAL BLEEDING °You may continue to bleed for 4-6 weeks after delivery. Over time, the amount of blood usually decreases and the color of the blood usually gets lighter. However, the flow of bright red blood may increase if you have been too active. If you need to use more than one pad in an hour because your pad gets soaked, or if you pass a large clot: °· Lie down. °· Raise your feet. °· Place a cold compress on your lower abdomen. °· Rest. °· Call your health care provider. ° °If you are breastfeeding, your period should return anytime between 8 weeks after delivery and the time that you stop breastfeeding. If you are not breastfeeding, your period should return 6-8 weeks after delivery. °PERINEAL CARE °The perineal area, or perineum, is the part of your body between your thighs. After delivery, this area needs special care. Follow these instructions as told by your health care provider. °· Take warm tub baths for 15-20 minutes. °· Use medicated pads and pain-relieving sprays and creams as told. °· Do not use tampons or douches until vaginal bleeding has stopped. °· Each time you go to the bathroom: °? Use a peri bottle. °? Change your pad. °? Use towelettes in place of toilet paper until your stitches have healed. °· Do Kegel exercises  every day. Kegel exercises help to maintain the muscles that support the vagina, bladder, and bowels. You can do these exercises while you are standing, sitting, or lying down. To do Kegel exercises: °? Tighten the muscles of your abdomen and the muscles that surround your birth canal. °? Hold for a few seconds. °? Relax. °? Repeat until you have done this 5 times in a row. °· To prevent hemorrhoids from developing or getting worse: °? Drink enough fluid to keep your urine clear or pale yellow. °? Avoid straining when having a bowel movement. °? Take over-the-counter medicines and stool softeners as told by your health care provider. ° °BREAST CARE °· Wear a tight-fitting bra. °· Avoid taking over-the-counter pain medicine for breast discomfort. °· Apply ice to the breasts to help with discomfort as needed: °? Put ice in a plastic bag. °? Place a towel between your skin and the bag. °? Leave the ice on for 20 minutes or as told by your health care provider. ° °NUTRITION °· Eat a well-balanced diet. °· Do not try to lose weight quickly by cutting back on calories. °· Take your prenatal vitamins until your postpartum checkup or until your health care provider tells you to stop. ° °POSTPARTUM DEPRESSION °You may find yourself crying for no apparent reason and unable to cope with all of the changes that come with having a newborn. This mood is called postpartum depression. Postpartum depression happens because your hormone   levels change after delivery. If you have postpartum depression, get support from your partner, friends, and family. If the depression does not go away on its own after several weeks, contact your health care provider. BREAST SELF-EXAM Do a breast self-exam each month, at the same time of the month. If you are breastfeeding, check your breasts just after a feeding, when your breasts are less full. If you are breastfeeding and your period has started, check your breasts on day 5, 6, or 7 of your  period. Report any lumps, bumps, or discharge to your health care provider. Know that breasts are normally lumpy if you are breastfeeding. This is temporary, and it is not a health risk. INTIMACY AND SEXUALITY Avoid sexual activity for at least 3-4 weeks after delivery or until the brownish-red vaginal flow is completely gone. If you want to avoid pregnancy, use some form of birth control. You can get pregnant after delivery, even if you have not had your period. SEEK MEDICAL CARE IF:  You feel unable to cope with the changes that a child brings to your life, and these feelings do not go away after several weeks.  You notice a lump, a bump, or discharge on your breast.  SEEK IMMEDIATE MEDICAL CARE IF:  Blood soaks your pad in 1 hour or less.  You have: ? Severe pain or cramping in your lower abdomen. ? A bad-smelling vaginal discharge. ? A fever that is not controlled by medicine. ? A fever, and an area of your breast is red and sore. ? Pain or redness in your calf. ? Sudden, severe chest pain. ? Shortness of breath. ? Painful or bloody urination. ? Problems with your vision.  You vomit for 12 hours or longer.  You develop a severe headache.  You have serious thoughts about hurting yourself, your child, or anyone else.  This information is not intended to replace advice given to you by your health care provider. Make sure you discuss any questions you have with your health care provider. Document Released: 05/14/2000 Document Revised: 10/23/2015 Document Reviewed: 11/18/2014 Elsevier Interactive Patient Education  2017 [employer]   Postpartum Hypertension Postpartum hypertension is high blood pressure after pregnancy that remains higher than normal for more than two days after delivery. You may not realize that you have postpartum hypertension if your blood pressure is not being checked regularly. In some cases, postpartum hypertension will go away on its own, usually within  a week of delivery. However, for some women, medical treatment is required to prevent serious complications, such as seizures or stroke. The following things can affect your blood pressure:  The type of delivery you had.  Having received IV fluids or other medicines during or after delivery.  What are the causes? Postpartum hypertension may be caused by any of the following or by a combination of any of the following:  Hypertension that existed before pregnancy (chronic hypertension).  Gestational hypertension.  Preeclampsia or eclampsia.  Receiving a lot of fluid through an IV during or after delivery.  Medicines.  HELLP syndrome.  Hyperthyroidism.  Stroke.  Other rare neurological or blood disorders.  In some cases, the cause may not be known. What increases the risk? Postpartum hypertension can be related to one or more risk factors, such as:  Chronic hypertension. In some cases, this may not have been diagnosed before pregnancy.  Obesity.  Type 2 diabetes.  Kidney disease.  Family history of preeclampsia.  Other medical conditions that cause hormonal imbalances.  are the signs or symptoms? °As with all types of hypertension, postpartum hypertension may not have any symptoms. Depending on how high your blood pressure is, you may experience: °· Headaches. These may be mild, moderate, or severe. They may also be steady, constant, or sudden in onset (thunderclap headache). °· Visual changes. °· Dizziness. °· Shortness of breath. °· Swelling of your hands, feet, lower legs, or face. In some cases, you may have swelling in more than one of these locations. °· Heart palpitations or a racing heartbeat. °· Difficulty breathing while lying down. °· Decreased urination. ° °Other rare signs and symptoms may include: °· Sweating more than usual. This lasts longer than a few days after delivery. °· Chest pain. °· Sudden dizziness when you get up from sitting or lying  down. °· Seizures. °· Nausea or vomiting. °· Abdominal pain. ° °How is this diagnosed? °The diagnosis of postpartum hypertension is made through a combination of physical examination findings and testing of your blood and urine. You may also have additional tests, such as a CT scan or an MRI, to check for other complications of postpartum hypertension. °How is this treated? °When blood pressure is high enough to require treatment, your options may include: °· Medicines to reduce blood pressure (antihypertensives). Tell your health care provider if you are breastfeeding or if you plan to breastfeed. There are many antihypertensive medicines that are safe to take while breastfeeding. °· Stopping medicines that may be causing hypertension. °· Treating medical conditions that are causing hypertension. °· Treating the complications of hypertension, such as seizures, stroke, or kidney problems. ° °Your health care provider will also continue to monitor your blood pressure closely and repeatedly until it is within a safe range for you. °Follow these instructions at home: °· Take medicines only as directed by your health care provider. °· Get regular exercise after your health care provider tells you that it is safe. °· Follow your health care provider’s recommendations on fluid and salt restrictions. °· Do not use any tobacco products, including cigarettes, chewing tobacco, or electronic cigarettes. If you need help quitting, ask your health care provider. °· Keep all follow-up visits as directed by your health care provider. This is important. °Contact a health care provider if: °· Your symptoms get worse. °· You have new symptoms, such as: °? Headache. °? Dizziness. °? Visual changes. °Get help right away if: °· You develop a severe or sudden headache. °· You have seizures. °· You develop numbness or weakness on one side of your body. °· You have difficulty thinking, speaking, or swallowing. °· You develop severe  abdominal pain. °· You develop difficulty breathing, chest pain, a racing heartbeat, or heart palpitations. °These symptoms may represent a serious problem that is an emergency. Do not wait to see if the symptoms will go away. Get medical help right away. Call your local emergency services (911 in the [place] Do not drive yourself to the hospital. °This information is not intended to replace advice given to you by your health care provider. Make sure you discuss any questions you have with your health care provider. °Document Released: 01/18/2014 Document Revised: 10/20/2015 Document Reviewed: 11/29/2013 °Elsevier Interactive Patient Education © 2018 Elsevier Inc. ° °

## 2017-01-31 NOTE — Lactation Note (Signed)
This note was copied from a baby's chart. Lactation Consultation Note  Patient Name: Brooke Casey NWGNF'AToday's Date: 01/31/2017 Reason for consult: Infant weight loss;Follow-up assessment   P1, 10.1% weight loss.  On 9/2 baby did not feed from 1205 am to 0903 am. Mother is breastfeeding more regularly.  Encouraged mother to be proactive and wake baby and undress for feedings if needed. Mother has started pumping with DEBP.  Gave baby approx 3 ml on spoon prior to latching. Mother latched baby in football hold.  Assisted w/ depth and breast compression to keep baby active. Encouraged mother to post pump 3-4 times per day and give volume back to baby until weight stabilizes. Also suggest mother hand express and give baby volume on spoon. Reviewed engorgement care and monitoring voids/stools. Mom encouraged to feed baby 8-12 times/24 hours and with feeding cues waking q 3 hours if needed. Discussed milk storage.  Mother has personal DEBP at home.  .     Maternal Data    Feeding Feeding Type: Breast Fed Length of feed: 45 min  LATCH Score Latch: Grasps breast easily, tongue down, lips flanged, rhythmical sucking.  Audible Swallowing: A few with stimulation  Type of Nipple: Everted at rest and after stimulation  Comfort (Breast/Nipple): Filling, red/small blisters or bruises, mild/mod discomfort  Hold (Positioning): Assistance needed to correctly position infant at breast and maintain latch.  LATCH Score: 7  Interventions Interventions: Assisted with latch;Breast compression;Expressed milk;DEBP  Lactation Tools Discussed/Used Pump Review: Milk Storage Date initiated:: 01/31/17   Consult Status Consult Status: Complete    Dahlia ByesBerkelhammer, Jone Panebianco Boschen 01/31/2017, 9:10 AM

## 2017-01-31 NOTE — Progress Notes (Signed)
[facility] RN consulted for home BP check on Wed. And phone number given to patient to call if there is any questions.

## 2017-01-31 NOTE — Discharge Summary (Signed)
OB Discharge Summary     Patient Name: Brooke Casey DOB: 07/13/1990 MRN: [id number]  Date of admission: 01/27/2017 Delivering MD: Myna HidalgoZAN, JENNIFER   Date of discharge: 01/31/2017  Admitting diagnosis: INDUCTION  Velamentous insertion Marginal insertion Dilated umbilical cord Shellfish allergy HSV by titer BMI 41  Intrauterine pregnancy: 3750w0d      Secondary diagnosis:  Principal Problem:   Status post primary low transverse cesarean section Active Problems:   HSV (herpes simplex virus) anogenital infection   BMI 40.0-44.9, adult (HCC)   Shellfish allergy   Pre-eclampsia, mild   LGA (large for gestational age) infant  Additional problems: None     Discharge diagnosis: Term Pregnancy Delivered and Preeclampsia (mild)                                                                                                Post partum procedures:None  Augmentation: AROM, Pitocin, Cytotec and Foley Balloon  Complications: None  Hospital course:  Induction of Labor With Cesarean Section  26 y.o. yo G1P1001 at 2250w0d was admitted to the hospital 01/27/2017 for induction of labor. Patient had a labor course significant forVelamentous insertion Marginal insertion Dilated umbilical cord Shellfish allergy HSV by titer BMI 41  Induction was begun on 8/31, with cytotech, pitocin, foley bulb, AROM--patient progressed to 6-7 cm, with no progress despite all efforts.  She had a single series of elevated BP prior to epidural placement--PCR was 0/52, PIH labs normal.  BP remained normotensive during labor, with the dx of pre-eclampsia without severe features/mild pre-eclampsia.  She did not require any medication during her pp stay.  BP on the day of d/c was 113-118-73-89.  Dr. Richardson Doppole was consulted, and the plan was made for a BP check in 48 hours.  PIH precautions were reviewed with the patient. The patient went for cesarean section due to Arrest of Dilation, and delivered a Viable infant on 01/28/17  at 1909, with AROM 9:21 AM ,01/28/2017   @Details  of operation can be found in separate operative Note.  Patient had an uncomplicated postpartum course. She is ambulating, tolerating a regular diet, passing flatus, and urinating well.  Patient is discharged home in stable condition on 01/31/17, with [facility] RN visit ordered for 02/02/17.                              Physical exam  Vitals:   01/29/17 1700 01/30/17 0536 01/30/17 1915 01/31/17 0605  BP: 125/71 113/73 133/89 118/88  Pulse: 90 89 91 85  Resp: 16 18 16 18   Temp: 98 F (36.7 C) 98.3 F (36.8 C) 98.2 F (36.8 C) 98.2 F (36.8 C)  TempSrc: Oral Oral Oral Oral  SpO2: 98%  100%    General: alert Lochia: appropriate Uterine Fundus: firm Incision: Dressing is clean, dry, and intact DVT Evaluation: No evidence of DVT seen on physical exam. Negative Homan's sign. Labs: CBC Latest Ref Rng & Units 01/29/2017 01/28/2017 01/27/2017  WBC 4.0 - 10.5 K/uL 10.3 9.3 7.9  Hemoglobin 12.0 - 15.0 g/dL 10.6(L) 12.9  12.5  Hematocrit 36.0 - 46.0 % 29.4(L) 36.8 35.1(L)  Platelets 150 - 400 K/uL 188 213 257   CMP Latest Ref Rng & Units 01/28/2017  Glucose 65 - 99 mg/dL 82  BUN 6 - 20 mg/dL 7  Creatinine 4.78 - 2.95 mg/dL 6.21  Sodium 308 - 657 mmol/L 138  Potassium 3.5 - 5.1 mmol/L 3.6  Chloride 101 - 111 mmol/L 106  CO2 22 - 32 mmol/L 23  Calcium 8.9 - 10.3 mg/dL 8.9  Total Protein 6.5 - 8.1 g/dL 7.0  Total Bilirubin 0.3 - 1.2 mg/dL 0.5  Alkaline Phos 38 - 126 U/L 195(H)  AST 15 - 41 U/L 19  ALT 14 - 54 U/L 16    Discharge instruction: per After Visit Summary and "Baby and Me Booklet".  After visit meds:  Allergies as of 01/31/2017      Reactions   Shellfish Allergy Nausea And Vomiting      Medication List    STOP taking these medications   valACYclovir 500 MG tablet Commonly known as:  VALTREX     TAKE these medications   ibuprofen 600 MG tablet Commonly known as:  ADVIL,MOTRIN Take 1 tablet (600 mg total) by mouth every  6 (six) hours.   oxyCODONE 5 MG immediate release tablet Commonly known as:  Oxy IR/ROXICODONE Take 1 tablet (5 mg total) by mouth every 4 (four) hours as needed (pain scale 4-7).   prenatal multivitamin Tabs tablet Take 1 tablet by mouth daily at 12 noon.            Discharge Care Instructions        Start     Ordered   01/31/17 0000  ibuprofen (ADVIL,MOTRIN) 600 MG tablet  Every 6 hours    Question:  Supervising Provider  Answer:  Silverio Lay   01/31/17 0756   01/31/17 0000  oxyCODONE (OXY IR/ROXICODONE) 5 MG immediate release tablet  Every 4 hours PRN    Question:  Supervising Provider  Answer:  Silverio Lay   01/31/17 0756   01/31/17 0000  Discharge instructions    Comments:  Per [facility] handout   01/31/17 0756   01/31/17 0000  Diet - low sodium heart healthy     01/31/17 0756   01/31/17 0000  Nursing communication    Comments:  Please have [facility] RN see patient on Wednesday, 02/02/17, for BP check.  Call results to [facility] at [phone].  Mild pre-eclampsia, no meds.   01/31/17 0824      Diet: routine diet  Activity: Advance as tolerated. Pelvic rest for 6 weeks.   Outpatient follow up:6 weeks, with BP check in 48 hours by [employer]. Follow up Appt:No future appointments. Follow up Visit:No Follow-up on file.  Postpartum contraception: Patient declines contraceptive discussion at this time.  Newborn Data: Live born female  Birth Weight: 9 lb 2.4 oz (4150 g) APGAR: 8, 9  Baby Feeding: Breast Disposition:home with mother   01/31/2017 [name], CNM

## 2017-06-11 IMAGING — CR DG FINGER MIDDLE 2+V*R*
1 series · 1 of 1 positions shown · non-contrast
Comparison: None.

CLINICAL DATA: Finger laceration

EXAM:
RIGHT MIDDLE FINGER 2+V

[PA]
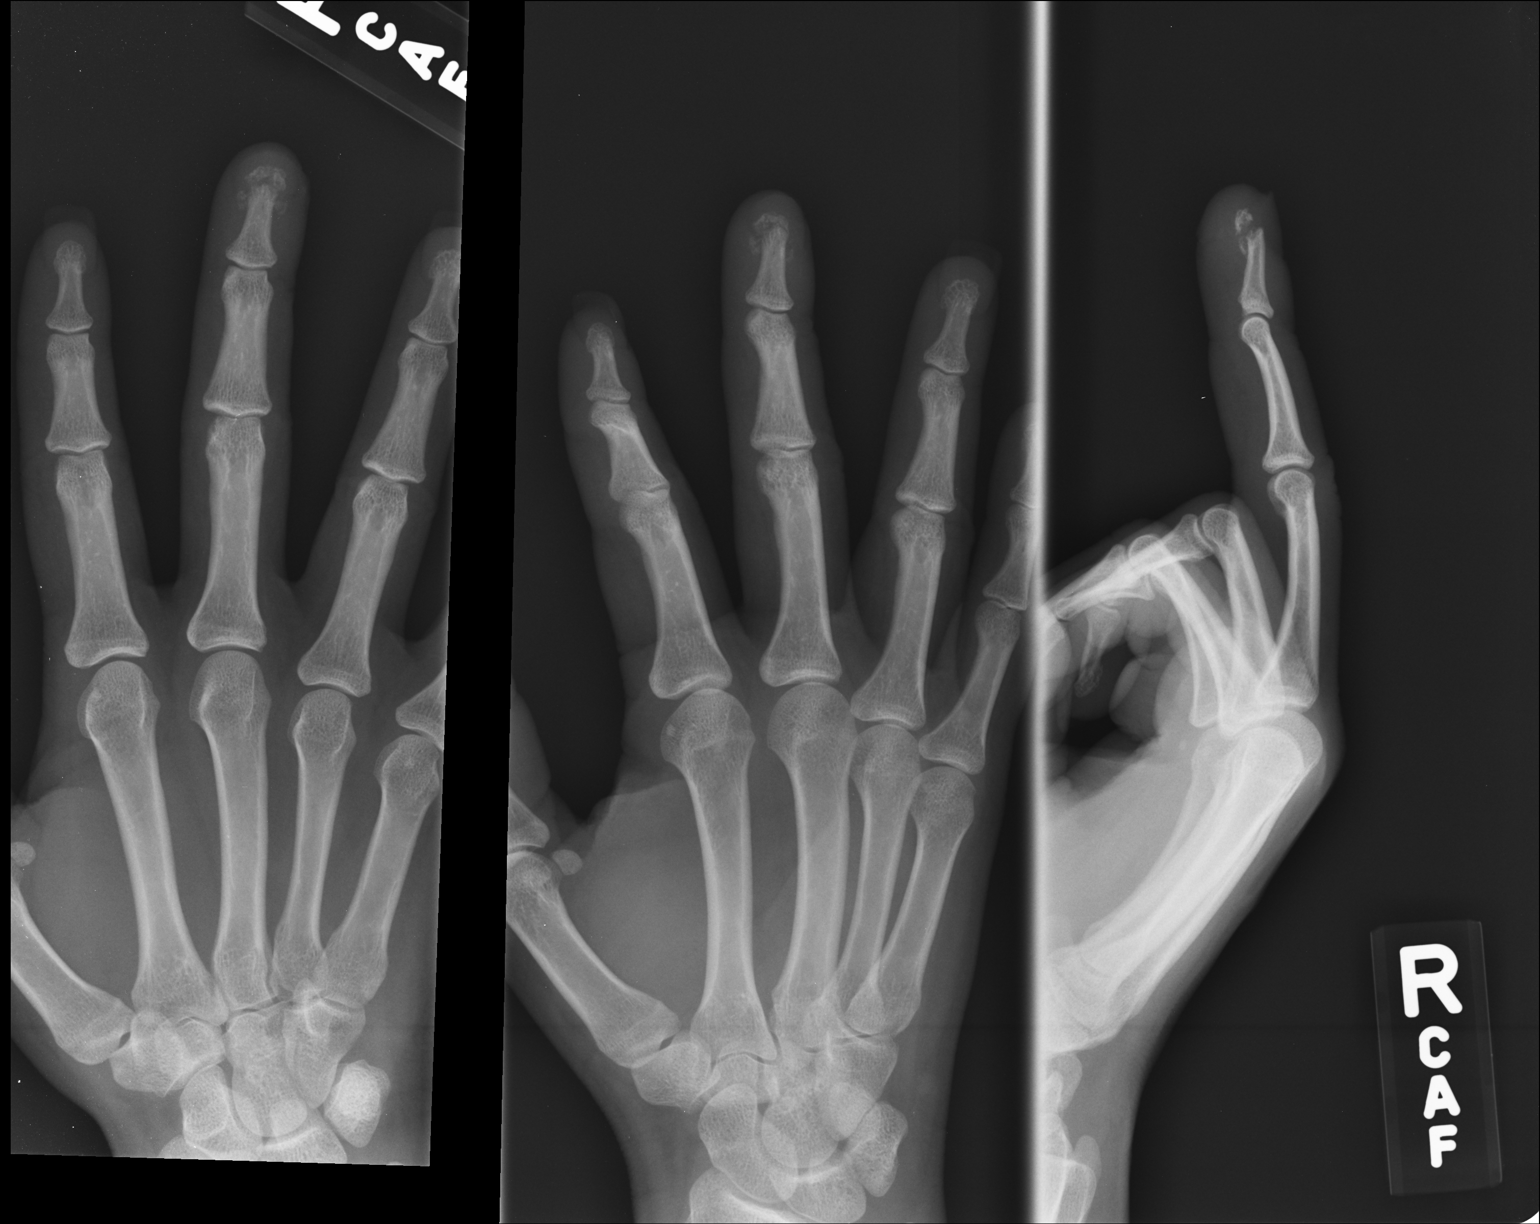

[1 of 1 positions shown; findings below may reference images not displayed]

FINDINGS: Views of the right third digit show fracture of the tuft of the
distal phalanx with slight distraction of the distal fragment. No
other acute abnormality is seen. Joint spaces appear normal.
IMPRESSION: Fracture of the tuft of the distal phalanx of the right third digit
with some distraction.

## 2018-05-31 NOTE — L&D Delivery Note (Signed)
Delivery Note Patient pushed for less than 5 minutes after she was noted to be C/C/+2.  At 2:36 PM a viable and healthy female was delivered via Vaginal, Spontaneous (Presentation:OA ).  Shoulders and body easily delivered. Baby was laid on maternal abdomen.  Delayed cord clamping was done. Cord cut by father. Cord blood obtained.  Placenta delivered spontaneously intact. Uterine atony was alleviated with massage and IV pitocin.  A sweep of the lower uterine segment was done and noted to be intact.    Partial third degree perineal laceration was noted and the sphincter repaired with  0-Vicryl.  The second degree vaginal was repaired in routine fashion with 2-0 vicryl.  The skin was re approximated with 3-0 chromic.  APGAR: 9, 9; weight pending.    Complications: none Patient tolerated delivery well. Successful VBAC  Anesthesia:  Epidural and local 1%lidocaine Episiotomy: None Lacerations: 3rd degree;Perineal;Vaginal Est. Blood Loss (mL):  313  Mom to postpartum.  Baby to Couplet care / Skin to Skin.  [name] [name] 08/16/2018, 3:08 PM

## 2018-08-16 ENCOUNTER — Other Ambulatory Visit: Payer: Self-pay

## 2018-08-16 ENCOUNTER — Inpatient Hospital Stay (HOSPITAL_COMMUNITY): Payer: Medicaid Other | Admitting: Anesthesiology

## 2018-08-16 ENCOUNTER — Encounter (HOSPITAL_COMMUNITY): Payer: Self-pay | Admitting: *Deleted

## 2018-08-16 ENCOUNTER — Inpatient Hospital Stay (HOSPITAL_COMMUNITY)
Admission: AD | Admit: 2018-08-16 | Discharge: 2018-08-18 | DRG: 768 | Disposition: A | Discharge status: Home / Self Care | Payer: Medicaid Other | Attending: Obstetrics & Gynecology | Admitting: Obstetrics & Gynecology

## 2018-08-16 DIAGNOSIS — O9832 Other infections with a predominantly sexual mode of transmission complicating childbirth: Secondary | ICD-10-CM | POA: Diagnosis present

## 2018-08-16 DIAGNOSIS — O26893 Other specified pregnancy related conditions, third trimester: Secondary | ICD-10-CM | POA: Diagnosis present

## 2018-08-16 DIAGNOSIS — A6000 Herpesviral infection of urogenital system, unspecified: Secondary | ICD-10-CM | POA: Diagnosis present

## 2018-08-16 DIAGNOSIS — Z3A40 40 weeks gestation of pregnancy: Secondary | ICD-10-CM | POA: Diagnosis not present

## 2018-08-16 DIAGNOSIS — O99214 Obesity complicating childbirth: Secondary | ICD-10-CM | POA: Diagnosis present

## 2018-08-16 DIAGNOSIS — O34219 Maternal care for unspecified type scar from previous cesarean delivery: Secondary | ICD-10-CM | POA: Diagnosis present

## 2018-08-16 LAB — TYPE AND SCREEN
ABO/RH(D): A POS
Antibody Screen: NEGATIVE

## 2018-08-16 LAB — ABO/RH: ABO/RH(D): A POS

## 2018-08-16 LAB — CBC
HCT: 37.4 % (ref 36.0–46.0)
Hemoglobin: 12.6 g/dL (ref 12.0–15.0)
MCH: 29.8 pg (ref 26.0–34.0)
MCHC: 33.7 g/dL (ref 30.0–36.0)
MCV: 88.4 fL (ref 80.0–100.0)
Platelets: 283 10*3/uL (ref 150–400)
RBC: 4.23 MIL/uL (ref 3.87–5.11)
RDW: 13.1 % (ref 11.5–15.5)
WBC: 10.1 10*3/uL (ref 4.0–10.5)
nRBC: 0 % (ref 0.0–0.2)

## 2018-08-16 MED ORDER — SIMETHICONE 80 MG PO CHEW: 80.0000 mg | CHEWABLE_TABLET | ORAL | Status: DC | PRN

## 2018-08-16 MED ORDER — EPHEDRINE 5 MG/ML INJ: 10.0000 mg | INTRAVENOUS | Status: DC | PRN

## 2018-08-16 MED ORDER — DIPHENHYDRAMINE HCL 25 MG PO CAPS: 25.0000 mg | ORAL_CAPSULE | Freq: Four times a day (QID) | ORAL | Status: DC | PRN

## 2018-08-16 MED ORDER — ONDANSETRON HCL 4 MG/2ML IJ SOLN: 4.0000 mg | INTRAMUSCULAR | Status: DC | PRN

## 2018-08-16 MED ORDER — OXYTOCIN BOLUS FROM INFUSION
500.0000 mL | Freq: Once | INTRAVENOUS | Status: AC
  Administered 2018-08-16: 500 mL via INTRAVENOUS

## 2018-08-16 MED ORDER — ONDANSETRON HCL 4 MG/2ML IJ SOLN: 4.0000 mg | Freq: Four times a day (QID) | INTRAMUSCULAR | Status: DC | PRN

## 2018-08-16 MED ORDER — LIDOCAINE HCL (PF) 1 % IJ SOLN
30.0000 mL | INTRAMUSCULAR | Status: AC | PRN
  Administered 2018-08-16: 30 mL via SUBCUTANEOUS
  Filled 2018-08-16: qty 30

## 2018-08-16 MED ORDER — SENNOSIDES-DOCUSATE SODIUM 8.6-50 MG PO TABS
2.0000 | ORAL_TABLET | ORAL | Status: DC
  Administered 2018-08-16 – 2018-08-17 (×2): 2 via ORAL
  Filled 2018-08-16 (×2): qty 2

## 2018-08-16 MED ORDER — SODIUM CHLORIDE (PF) 0.9 % IJ SOLN
INTRAMUSCULAR | Status: DC | PRN
  Administered 2018-08-16: 12 mL/h via EPIDURAL

## 2018-08-16 MED ORDER — BENZOCAINE-MENTHOL 20-0.5 % EX AERO: 1.0000 "application " | INHALATION_SPRAY | CUTANEOUS | Status: DC | PRN

## 2018-08-16 MED ORDER — TETANUS-DIPHTH-ACELL PERTUSSIS 5-2.5-18.5 LF-MCG/0.5 IM SUSP: 0.5000 mL | Freq: Once | INTRAMUSCULAR | Status: DC

## 2018-08-16 MED ORDER — LACTATED RINGERS IV SOLN: 500.0000 mL | INTRAVENOUS | Status: DC | PRN

## 2018-08-16 MED ORDER — ACETAMINOPHEN 325 MG PO TABS: 650.0000 mg | ORAL_TABLET | ORAL | Status: DC | PRN

## 2018-08-16 MED ORDER — COCONUT OIL OIL: 1.0000 "application " | TOPICAL_OIL | Status: DC | PRN

## 2018-08-16 MED ORDER — DIBUCAINE 1 % RE OINT: 1.0000 "application " | TOPICAL_OINTMENT | RECTAL | Status: DC | PRN

## 2018-08-16 MED ORDER — LIDOCAINE-EPINEPHRINE (PF) 2 %-1:200000 IJ SOLN
INTRAMUSCULAR | Status: DC | PRN
  Administered 2018-08-16: 4 mL via EPIDURAL
  Administered 2018-08-16: 3 mL via EPIDURAL

## 2018-08-16 MED ORDER — ZOLPIDEM TARTRATE 5 MG PO TABS: 5.0000 mg | ORAL_TABLET | Freq: Every evening | ORAL | Status: DC | PRN

## 2018-08-16 MED ORDER — ACETAMINOPHEN 325 MG PO TABS
650.0000 mg | ORAL_TABLET | ORAL | Status: DC | PRN
  Administered 2018-08-16: 650 mg via ORAL
  Filled 2018-08-16: qty 2

## 2018-08-16 MED ORDER — PRENATAL MULTIVITAMIN CH
1.0000 | ORAL_TABLET | Freq: Every day | ORAL | Status: DC
  Administered 2018-08-17 – 2018-08-18 (×2): 1 via ORAL
  Filled 2018-08-16 (×2): qty 1

## 2018-08-16 MED ORDER — DIPHENHYDRAMINE HCL 50 MG/ML IJ SOLN: 12.5000 mg | INTRAMUSCULAR | Status: DC | PRN

## 2018-08-16 MED ORDER — PHENYLEPHRINE 40 MCG/ML (10ML) SYRINGE FOR IV PUSH (FOR BLOOD PRESSURE SUPPORT): 80.0000 ug | PREFILLED_SYRINGE | INTRAVENOUS | Status: DC | PRN

## 2018-08-16 MED ORDER — LACTATED RINGERS IV SOLN
500.0000 mL | Freq: Once | INTRAVENOUS | Status: AC
  Administered 2018-08-16: 500 mL via INTRAVENOUS

## 2018-08-16 MED ORDER — OXYCODONE-ACETAMINOPHEN 5-325 MG PO TABS: 2.0000 | ORAL_TABLET | ORAL | Status: DC | PRN

## 2018-08-16 MED ORDER — FENTANYL-BUPIVACAINE-NACL 0.5-0.125-0.9 MG/250ML-% EP SOLN
12.0000 mL/h | EPIDURAL | Status: DC | PRN
  Filled 2018-08-16: qty 250

## 2018-08-16 MED ORDER — OXYTOCIN 40 UNITS IN NORMAL SALINE INFUSION - SIMPLE MED
2.5000 [IU]/h | INTRAVENOUS | Status: DC
  Filled 2018-08-16: qty 1000

## 2018-08-16 MED ORDER — WITCH HAZEL-GLYCERIN EX PADS: 1.0000 "application " | MEDICATED_PAD | CUTANEOUS | Status: DC | PRN

## 2018-08-16 MED ORDER — OXYCODONE-ACETAMINOPHEN 5-325 MG PO TABS: 1.0000 | ORAL_TABLET | ORAL | Status: DC | PRN

## 2018-08-16 MED ORDER — LACTATED RINGERS IV SOLN
INTRAVENOUS | Status: DC
  Administered 2018-08-16 (×2): via INTRAVENOUS

## 2018-08-16 MED ORDER — MAGNESIUM HYDROXIDE 400 MG/5ML PO SUSP: 30.0000 mL | ORAL | Status: DC | PRN

## 2018-08-16 MED ORDER — LACTATED RINGERS IV SOLN: INTRAVENOUS | Status: DC

## 2018-08-16 MED ORDER — SOD CITRATE-CITRIC ACID 500-334 MG/5ML PO SOLN: 30.0000 mL | ORAL | Status: DC | PRN

## 2018-08-16 MED ORDER — IBUPROFEN 600 MG PO TABS
600.0000 mg | ORAL_TABLET | Freq: Four times a day (QID) | ORAL | Status: DC
  Administered 2018-08-16 – 2018-08-18 (×8): 600 mg via ORAL
  Filled 2018-08-16 (×8): qty 1

## 2018-08-16 MED ORDER — ONDANSETRON HCL 4 MG PO TABS: 4.0000 mg | ORAL_TABLET | ORAL | Status: DC | PRN

## 2018-08-16 MED ORDER — OXYCODONE-ACETAMINOPHEN 5-325 MG PO TABS
1.0000 | ORAL_TABLET | ORAL | Status: DC | PRN
  Administered 2018-08-17 – 2018-08-18 (×4): 1 via ORAL
  Filled 2018-08-16 (×4): qty 1

## 2018-08-16 NOTE — Progress Notes (Signed)
Pt instructed she may ambulate, instructed to remain on the 1st floor and return to room @ 0945 for reeval.  Pt verbalized understanding & agreeable.

## 2018-08-16 NOTE — H&P (Signed)
MD HISTORY & PHYSICAL    Brooke Casey is a 28 y.o. female presenting for regular painful contractions.  She report bloody show, no leaking of fluid, reports normal fetal movement.   Prenatal Course complicated by:  Maternal obesity HSV1/2 - on prophylaxis History of previous cesarean section x 1 for macrosomia  OB History    Gravida  2   Para  1   Term  1   Preterm      AB      Living  1     SAB      TAB      Ectopic      Multiple  0   Live Births  1          Past Medical History:  Diagnosis Date  . HSV (herpes simplex virus) anogenital infection    Past Surgical History:  Procedure Laterality Date  . CESAREAN SECTION N/A 01/28/2017   Procedure: CESAREAN SECTION;  Surgeon: Brooke Hidalgo, DO;  Location: [facility];  Service: Obstetrics;  Laterality: N/A;  . NO PAST SURGERIES     Family History: family history is not on file. Social History:  reports that she has never smoked. She has never used smokeless tobacco. She reports that she does not drink alcohol or use drugs.     Maternal Diabetes: No Genetic Screening: Normal Maternal Ultrasounds/Referrals: Normal Fetal Ultrasounds or other Referrals:  None Maternal Substance Abuse:  No Significant Maternal Medications:  Meds include: Other:  PNV, Valtrex Significant Maternal Lab Results:  Lab values include: Group B Strep negative Other Comments:  None  Review of Systems  All other systems reviewed and are negative.  as Per HPI   Maternal Exam:  Uterine Assessment: Contraction strength is moderate.  Contraction frequency is regular.   Abdomen: Patient reports no abdominal tenderness. Surgical scars: low transverse.   Fundal height is 40 weeks.   Estimated fetal weight is 3500 grams.   Fetal presentation: vertex  Introitus: Normal vulva. Normal vagina.  Ferning test: not done.  Nitrazine test: not done. Amniotic fluid character: not assessed.     Fetal Exam Fetal Monitor Review:  Mode: hand-held doppler probe.   Baseline rate: 145.  Variability: moderate (6-25 bpm).   Pattern: no decelerations and accelerations present.    Fetal State Assessment: Category I - tracings are normal.     Physical Exam  Nursing note and vitals reviewed. Constitutional: She appears well-developed and well-nourished.  Genitourinary:    Vulva normal.     Dilation: 4.5 Effacement (%): 100 Station: -2 Exam by:: Brooke Casey, CNM Blood pressure 113/74, pulse 78, temperature 98 F (36.7 C), temperature source Oral, resp. rate 18, height 5\' 5"  (1.651 m), weight 118.6 kg, SpO2 96 %, unknown if currently breastfeeding.   Prenatal labs: ABO, Rh:  A POS Antibody:  Neg Rubella:  Immune RPR:   Immune HBsAg:   Neg HIV:   Neg GBS:   Neg   Assessment/Plan: 28 year old G2P1001 at 40 weeks 1 day in early active labor for TOLAC  Admit to L&D Continuous monitoring IV hydration Epidural on demand VBAC consent form signed previously in office and placed into chart.    Brooke Casey 08/16/2018, 11:43 AM

## 2018-08-16 NOTE — Anesthesia Preprocedure Evaluation (Signed)
Anesthesia Evaluation  Patient identified by MRN, date of birth, ID band Patient awake    Reviewed: Allergy & Precautions, NPO status , Patient's Chart, lab work & pertinent test results  Airway Mallampati: II  TM Distance: >3 FB Neck ROM: Full    Dental no notable dental hx.    Pulmonary neg pulmonary ROS,    Pulmonary exam normal breath sounds clear to auscultation       Cardiovascular negative cardio ROS Normal cardiovascular exam Rhythm:Regular Rate:Normal     Neuro/Psych negative neurological ROS  negative psych ROS   GI/Hepatic negative GI ROS, Neg liver ROS,   Endo/Other  Morbid obesity  Renal/GU negative Renal ROS  negative genitourinary   Musculoskeletal negative musculoskeletal ROS (+)   Abdominal   Peds  Hematology negative hematology ROS (+)   Anesthesia Other Findings   Reproductive/Obstetrics (+) Pregnancy                             Anesthesia Physical Anesthesia Plan  ASA: III  Anesthesia Plan: Epidural   Post-op Pain Management:    Induction:   PONV Risk Score and Plan: Treatment may vary due to age or medical condition  Airway Management Planned: Natural Airway  Additional Equipment:   Intra-op Plan:   Post-operative Plan:   Informed Consent: I have reviewed the patients History and Physical, chart, labs and discussed the procedure including the risks, benefits and alternatives for the proposed anesthesia with the patient or authorized representative who has indicated his/her understanding and acceptance.       Plan Discussed with: Anesthesiologist  Anesthesia Plan Comments: (Patient identified. Risks, benefits, options discussed with patient including but not limited to bleeding, infection, nerve damage, paralysis, failed block, incomplete pain control, headache, blood pressure changes, nausea, vomiting, reactions to medication, itching, and post  partum back pain. Confirmed with bedside nurse the patient's most recent platelet count. Confirmed with the patient that they are not taking any anticoagulation, have any bleeding history or any family history of bleeding disorders. Patient expressed understanding and wishes to proceed. All questions were answered. )        Anesthesia Quick Evaluation  

## 2018-08-16 NOTE — MAU Note (Signed)
Contractions started at 0200, ~q43min.  No leaking. Small amt of mucous with blood streaks. Was 1cm last wk.  No problems with preg

## 2018-08-16 NOTE — Progress Notes (Signed)
Brooke Casey is a 28 y.o. G2P1001 at [redacted]w[redacted]d by LMP admitted for active labor  Subjective: Patient starting to feel more pressure  Objective: BP 130/75   Pulse 92   Temp 98.4 F (36.9 C) (Oral)   Resp 18   Ht 5\' 5"  (1.651 m)   Wt 118.6 kg   SpO2 99%   BMI 43.52 kg/m  No intake/output data recorded. No intake/output data recorded.  FHT:  FHR: 145 bpm, variability: moderate,  accelerations:  Present,  decelerations:  Absent UC:   regular, every 3 minutes SVE:   Dilation: 9 Effacement (%): 100 Station: 0 Exam by:: Dr. Mora Appl AROM scant clear fluid IUPC placed Foley Catheter placed  Labs: Lab Results  Component Value Date   WBC 10.1 08/16/2018   HGB 12.6 08/16/2018   HCT 37.4 08/16/2018   MCV 88.4 08/16/2018   PLT 283 08/16/2018    Assessment / Plan: Spontaneous labor, progressing normally  Labor: Progressing normally Preeclampsia:  no signs or symptoms of toxicity Fetal Wellbeing:  Category I Pain Control:  Epidural I/D:  n/a Anticipated MOD:  VBAC  Brooke Casey Brooke Casey 08/16/2018, 12:49 PM

## 2018-08-16 NOTE — Anesthesia Procedure Notes (Signed)
Epidural Patient location during procedure: OB Start time: 08/16/2018 12:15 PM End time: 08/16/2018 12:30 PM  Staffing Anesthesiologist: Elmer Picker, MD Performed: anesthesiologist   Preanesthetic Checklist Completed: patient identified, pre-op evaluation, timeout performed, IV checked, risks and benefits discussed and monitors and equipment checked  Epidural Patient position: sitting Prep: site prepped and draped and DuraPrep Patient monitoring: continuous pulse ox, blood pressure, heart rate and cardiac monitor Approach: midline Location: L3-L4 Injection technique: LOR air  Needle:  Needle type: Tuohy  Needle gauge: 17 G Needle length: 9 cm Needle insertion depth: 7 cm Catheter type: closed end flexible Catheter size: 19 Gauge Catheter at skin depth: 13 cm Test dose: negative  Assessment Sensory level: T8 Events: blood not aspirated, injection not painful, no injection resistance, negative IV test and no paresthesia  Additional Notes Patient identified. Risks/Benefits/Options discussed with patient including but not limited to bleeding, infection, nerve damage, paralysis, failed block, incomplete pain control, headache, blood pressure changes, nausea, vomiting, reactions to medication both or allergic, itching and postpartum back pain. Confirmed with bedside nurse the patient's most recent platelet count. Confirmed with patient that they are not currently taking any anticoagulation, have any bleeding history or any family history of bleeding disorders. Patient expressed understanding and wished to proceed. All questions were answered. Sterile technique was used throughout the entire procedure. Please see nursing notes for vital signs. Test dose was given through epidural catheter and negative prior to continuing to dose epidural or start infusion. Warning signs of high block given to the patient including shortness of breath, tingling/numbness in hands, complete motor block,  or any concerning symptoms with instructions to call for help. Patient was given instructions on fall risk and not to get out of bed. All questions and concerns addressed with instructions to call with any issues or inadequate analgesia.  Reason for block:procedure for pain

## 2018-08-17 LAB — CBC
HEMATOCRIT: 31.9 % — AB (ref 36.0–46.0)
Hemoglobin: 10.5 g/dL — ABNORMAL LOW (ref 12.0–15.0)
MCH: 29.1 pg (ref 26.0–34.0)
MCHC: 32.9 g/dL (ref 30.0–36.0)
MCV: 88.4 fL (ref 80.0–100.0)
Platelets: 253 10*3/uL (ref 150–400)
RBC: 3.61 MIL/uL — ABNORMAL LOW (ref 3.87–5.11)
RDW: 13.2 % (ref 11.5–15.5)
WBC: 12.3 10*3/uL — AB (ref 4.0–10.5)
nRBC: 0 % (ref 0.0–0.2)

## 2018-08-17 LAB — RPR: RPR Ser Ql: NONREACTIVE

## 2018-08-17 NOTE — Progress Notes (Signed)
Subjective: Postpartum Day 1: Vaginal delivery, 3rd degree laceration, reports perineal pain at site of sutures. Is taking ibuprofen. Patient up ad lib, reports no syncope or dizziness, but fatigued. Feeding:  Breast, reports baby very sleepy. Discussed normal sleep/arousal pattern in newborn. Contraceptive plan:  Undecided. BFing.Does not desire Nexplanon or IUD. Discussed POPs and Depo. Has had one small BM already.  Objective: Vital signs in last 24 hours: Temp:  [97.9 F (36.6 C)-99.2 F (37.3 C)] 98.9 F (37.2 C) (03/19 0636) Pulse Rate:  [78-114] 87 (03/19 0636) Resp:  [16-18] 18 (03/19 0636) BP: (99-130)/(62-98) 115/79 (03/19 0636) SpO2:  [96 %-99 %] 98 % (03/19 0636) Weight:  [118.6 kg] 118.6 kg (03/18 1128)  Physical Exam:  General: alert, cooperative and no distress Lochia: appropriate Uterine Fundus: firm Perineum: deferred DVT Evaluation: No evidence of DVT seen on physical exam.   CBC Latest Ref Rng & Units 08/17/2018 08/16/2018 01/29/2017  WBC 4.0 - 10.5 K/uL 12.3(H) 10.1 10.3  Hemoglobin 12.0 - 15.0 g/dL 10.5(L) 12.6 10.6(L)  Hematocrit 36.0 - 46.0 % 31.9(L) 37.4 29.4(L)  Platelets 150 - 400 K/uL 253 283 188     Assessment/Plan: Status post vaginal delivery day 1. Stable Continue current care. Offered early discharge, pt declined. Plan for discharge tomorrow    Gwynneth Munson 08/17/2018, 7:53 AM

## 2018-08-17 NOTE — Anesthesia Postprocedure Evaluation (Signed)
Anesthesia Post Note  Patient: Brooke Casey  Procedure(s) Performed: AN AD HOC LABOR EPIDURAL     Patient location during evaluation: Mother Baby Anesthesia Type: Epidural Level of consciousness: awake and alert Pain management: pain level controlled Vital Signs Assessment: post-procedure vital signs reviewed and stable Respiratory status: spontaneous breathing, nonlabored ventilation and respiratory function stable Cardiovascular status: stable Postop Assessment: no headache, no backache and epidural receding Anesthetic complications: no    Last Vitals:  Vitals:   08/17/18 0245 08/17/18 0636  BP: 104/66 115/79  Pulse: 94 87  Resp: 16 18  Temp: 37.1 C 37.2 C  SpO2: 98% 98%    Last Pain:  Vitals:   08/17/18 0801  TempSrc:   PainSc: 8    Pain Goal: Patients Stated Pain Goal: 4 (08/16/18 1139)                 Marrion Coy

## 2018-08-18 NOTE — Lactation Note (Signed)
This note was copied from a baby's chart. Lactation Consultation Note  Patient Name: Brooke Casey [id number] Date: 08/18/2018 Reason for consult: Follow-up assessment;Term P2, 35 hour female infant. Per mom, infant had 2 voids and 3 stools at 35 hours. Mom feels breastfeeding is going well no concerns at this time. Infant been cluster feeding throughout the night. Mom was towards end of a feeding when LC entered the room, infant latched on Mom's right breast using football hold position few swallows observed.  Mom pulled infant on breast without breaking latch, LC reminded mom to break latch to prevent trauma to nipples and sore breast. Mom demonstrated hand expression and infant give 2 ml of colostrum by spoon. Mom knows to breastfeed according hunger cues, 8 or more times within 24 hours. LC discussed engorgement treatment and prevention with mom. LC reminded mom of O/P services, breastfeeding support groups, community resources, and our phone # for post-discharge questions.  Maternal Data    Feeding Feeding Type: Breast Fed  LATCH Score Latch: Grasps breast easily, tongue down, lips flanged, rhythmical sucking.  Audible Swallowing: A few with stimulation  Type of Nipple: Everted at rest and after stimulation  Comfort (Breast/Nipple): Soft / non-tender  Hold (Positioning): No assistance needed to correctly position infant at breast.  LATCH Score: 9  Interventions Interventions: Hand express  Lactation Tools Discussed/Used     Consult Status Consult Status: Follow-up Date: 08/18/18 Follow-up type: In-patient    Brooke Casey 08/18/2018, 2:07 AM

## 2018-08-18 NOTE — Discharge Summary (Signed)
SVD OB Discharge Summary     Patient Name: Brooke Casey DOB: 1990/06/16 MRN: [id number]  Date of admission: 08/16/2018 Delivering MD: [name]  Date of delivery: 08/16/2018 Type of delivery: VBAC  Newborn Data: Sex: Baby Female  Circumcision: out pt desired Live born female  Birth Weight: 8 lb 9.2 oz (3890 g) APGAR: 9, 9  Newborn Delivery   Birth date/time:  08/16/2018 14:36:00 Delivery type:  VBAC, Spontaneous     Feeding: breast Infant being discharge to home with mother in stable condition.   Admitting diagnosis: ctx 10-60mins  Intrauterine pregnancy: [redacted]w[redacted]d     Secondary diagnosis:  Active Problems:   Normal labor   Normal postpartum course                                Complications: None                                                              Intrapartum Procedures: spontaneous vaginal delivery Postpartum Procedures: none Complications-Operative and Postpartum: 3ed degree perineal laceration Augmentation: AROM   History of Present Illness: Brooke Casey is a 28 y.o. female, G2P2002, who presents at [redacted]w[redacted]d weeks gestation. The patient has been followed at  [facility] and Gynecology  Her pregnancy has been complicated by:  Patient Active Problem List   Diagnosis Date Noted  . Normal postpartum course 08/18/2018  . Normal labor 08/16/2018  . Status post primary low transverse cesarean section 01/31/2017  . Pre-eclampsia, mild 01/31/2017  . LGA (large for gestational age) infant 01/31/2017  . HSV (herpes simplex virus) anogenital infection 01/27/2017  . BMI 40.0-44.9, adult (HCC) 01/27/2017  . Shellfish allergy 01/27/2017   Hospital course:  Onset of Labor With Vaginal Delivery     28 y.o. yo [id number] at [redacted]w[redacted]d was admitted in Active Labor on 08/16/2018. Patient had an uncomplicated labor course andVBAC as follows:  Membrane Rupture Time/Date: 12:46 PM ,08/16/2018   Intrapartum Procedures: Episiotomy: None [1]                  Lacerations:  3rd degree [4];Perineal [11];Vaginal [6]  Patient had a delivery of a Viable infant. 08/16/2018  Information for the patient's newborn:  Brooke, Casey [[id number]]  Delivery Method: VBAC, Spontaneous(Filed from Delivery Summary)    Pateint had an uncomplicated postpartum course.  She is ambulating, tolerating a regular diet, passing flatus, and urinating well. Patient is discharged home in stable condition on 08/18/18.  Postpartum Day # 2 : S/P NSVD due to spontaneous labor. Patient up ad lib, denies syncope or dizziness. Reports consuming regular diet without issues and denies N/V. Patient reports 0 bowel movement + passing flatus.  Denies issues with urination and reports bleeding is "lighter."  Patient is breastfeeding and reports going well.  Desires possibly nexplanon for postpartum contraception.  Pain is being appropriately managed with use of po meds. Post delivery HGB was 10.5, no s/s/sx, stable. D/C pt today.   Physical exam  Vitals:   08/17/18 0833 08/17/18 1527 08/17/18 2159 08/18/18 0625  BP: 113/74 114/74 120/72 102/79  Pulse: 88 92 92 91  Resp: 18 18 18 17   Temp: 97.7 F (36.5 C)  98.3 F (  36.8 C) 98.5 F (36.9 C)  TempSrc: Oral  Oral Oral  SpO2: 99% 98% 99%   Weight:      Height:       General: alert, cooperative and no distress Lochia: appropriate Uterine Fundus: firm Perineum: approximate, no hematoma.  DVT Evaluation: No evidence of DVT seen on physical exam. Negative Homan's sign. No cords or calf tenderness. No significant calf/ankle edema.  Labs: Lab Results  Component Value Date   WBC 12.3 (H) 08/17/2018   HGB 10.5 (L) 08/17/2018   HCT 31.9 (L) 08/17/2018   MCV 88.4 08/17/2018   PLT 253 08/17/2018   CMP Latest Ref Rng & Units 01/28/2017  Glucose 65 - 99 mg/dL 82  BUN 6 - 20 mg/dL 7  Creatinine 5.84 - 4.17 mg/dL 1.27  Sodium 871 - 836 mmol/L 138  Potassium 3.5 - 5.1 mmol/L 3.6  Chloride 101 - 111 mmol/L 106  CO2  22 - 32 mmol/L 23  Calcium 8.9 - 10.3 mg/dL 8.9  Total Protein 6.5 - 8.1 g/dL 7.0  Total Bilirubin 0.3 - 1.2 mg/dL 0.5  Alkaline Phos 38 - 126 U/L 195(H)  AST 15 - 41 U/L 19  ALT 14 - 54 U/L 16    Date of discharge: 08/18/2018 Discharge Diagnoses: Term Pregnancy-delivered and VBAC Discharge instruction: per After Visit Summary and "Baby and Me Booklet".  After visit meds:  Allergies as of 08/18/2018      Reactions   Shellfish Allergy Nausea And Vomiting      Medication List    TAKE these medications   ibuprofen 600 MG tablet Commonly known as:  ADVIL,MOTRIN Take 1 tablet (600 mg total) by mouth every 6 (six) hours.   prenatal multivitamin Tabs tablet Take 1 tablet by mouth daily at 12 noon.   valACYclovir 1000 MG tablet Commonly known as:  VALTREX Take 1,000 mg by mouth daily.       Activity:           unrestricted Advance as tolerated. Pelvic rest for 6 weeks.  Diet:                routine Medications: PNV, Ibuprofen and Colace Postpartum contraception: Nexplanon Condition:  Pt discharge to home with baby in stable and condition   Meds: Allergies as of 08/18/2018      Reactions   Shellfish Allergy Nausea And Vomiting      Medication List    TAKE these medications   ibuprofen 600 MG tablet Commonly known as:  ADVIL,MOTRIN Take 1 tablet (600 mg total) by mouth every 6 (six) hours.   prenatal multivitamin Tabs tablet Take 1 tablet by mouth daily at 12 noon.   valACYclovir 1000 MG tablet Commonly known as:  VALTREX Take 1,000 mg by mouth daily.       Discharge Follow Up:  Follow-up Information    [facility] Follow up.   Specialty:  Obstetrics and Gynecology Why:  please call the office to make a 1-2 week appoitnment for baby female circ, and 6 weeks PPV.  Contact information: [address] [place] [address] [phone]           [place], NP-C, CNM 08/18/2018, 2:03 PM  Dale [place], FNP

## 2018-08-18 NOTE — Lactation Note (Signed)
This note was copied from a baby's chart. Lactation Consultation Note  Patient Name: Brooke Casey [id number]'Z Date: 08/18/2018 Reason for consult: Follow-up assessment;Term  Visited with P2 Mom of term baby at 73 hrs old on day of discharge.  Mom denies any difficulty with positioning and latching baby.  Mom states she feels her breasts are filling.    Encouraged keeping baby STS as much as possible, feeding him often with cues.    Engorgement prevention and treatment reviewed. Mom aware of OP lactation support available to her and encouraged her to call prn for concerns.   Consult Status Consult Status: Complete Date: 08/18/18 Follow-up type: Call as needed    Judee Clara 08/18/2018, 10:16 AM

## 2018-10-22 ENCOUNTER — Ambulatory Visit (HOSPITAL_COMMUNITY)
Admission: EM | Admit: 2018-10-22 | Discharge: 2018-10-22 | Disposition: A | Discharge status: Home / Self Care | Payer: Medicaid Other | Attending: Family Medicine | Admitting: Family Medicine

## 2018-10-22 ENCOUNTER — Ambulatory Visit (INDEPENDENT_AMBULATORY_CARE_PROVIDER_SITE_OTHER): Payer: Medicaid Other

## 2018-10-22 ENCOUNTER — Encounter (HOSPITAL_COMMUNITY): Payer: Self-pay | Admitting: Emergency Medicine

## 2018-10-22 ENCOUNTER — Other Ambulatory Visit: Payer: Self-pay

## 2018-10-22 DIAGNOSIS — S99922A Unspecified injury of left foot, initial encounter: Secondary | ICD-10-CM | POA: Diagnosis not present

## 2018-10-22 NOTE — ED Notes (Signed)
Patient able to ambulate independently  

## 2018-10-22 NOTE — ED Triage Notes (Signed)
Pt presents to [facility] for assessment of left foot pain after the screen door slammed down on it last night.  Pt is limping at triage.

## 2018-10-22 NOTE — Discharge Instructions (Signed)
Ice Elevate Tylenol for pain

## 2018-10-22 NOTE — ED Provider Notes (Signed)
[facility]    CSN: [id number] Arrival date & time: 10/22/18  1104     History   Chief Complaint Chief Complaint  Patient presents with  . Foot Pain    HPI Brooke Casey is a 28 y.o. female.   HPI  She closed a screen door on her foot yesterday.  Her great toe on the left foot is painful.  Blood under the toenail that is leaking out around the edges.  She has been using ice and elevation.  She has a Band-Aid on it.  Bleeding is been stopped with pressure  Past Medical History:  Diagnosis Date  . HSV (herpes simplex virus) anogenital infection   . Velamentous insertion of umbilical cord     Patient Active Problem List   Diagnosis Date Noted  . Normal postpartum course 08/18/2018  . Normal labor 08/16/2018  . Status post primary low transverse cesarean section 01/31/2017  . Pre-eclampsia, mild 01/31/2017  . LGA (large for gestational age) infant 01/31/2017  . HSV (herpes simplex virus) anogenital infection 01/27/2017  . BMI 40.0-44.9, adult (HCC) 01/27/2017  . Shellfish allergy 01/27/2017    Past Surgical History:  Procedure Laterality Date  . CESAREAN SECTION N/A 01/28/2017   Procedure: CESAREAN SECTION;  Surgeon: Myna Hidalgo, DO;  Location: [facility];  Service: Obstetrics;  Laterality: N/A;  . NO PAST SURGERIES      OB History    Gravida  2   Para  2   Term  2   Preterm      AB      Living  2     SAB      TAB      Ectopic      Multiple  0   Live Births  2            Home Medications    Prior to Admission medications   Medication Sig Start Date End Date Taking? Authorizing Provider  ibuprofen (ADVIL,MOTRIN) 600 MG tablet Take 1 tablet (600 mg total) by mouth every 6 (six) hours. 01/31/17   [name], CNM  valACYclovir (VALTREX) 1000 MG tablet Take 1,000 mg by mouth daily. 07/28/18   [provider]    Family History History reviewed. No pertinent family history.  Social History Social History    Tobacco Use  . Smoking status: Never Smoker  . Smokeless tobacco: Never Used  Substance Use Topics  . Alcohol use: No  . Drug use: No     Allergies   Shellfish allergy   Review of Systems Review of Systems  Constitutional: Negative for chills and fever.  HENT: Negative for ear pain and sore throat.   Eyes: Negative for pain and visual disturbance.  Respiratory: Negative for cough and shortness of breath.   Cardiovascular: Negative for chest pain and palpitations.  Gastrointestinal: Negative for abdominal pain and vomiting.  Genitourinary: Negative for dysuria and hematuria.  Musculoskeletal: Positive for gait problem. Negative for arthralgias and back pain.  Skin: Positive for wound. Negative for color change and rash.  Neurological: Negative for seizures and syncope.  All other systems reviewed and are negative.    Physical Exam Triage Vital Signs ED Triage Vitals  Enc Vitals Group     BP 10/22/18 1125 123/75     Pulse Rate 10/22/18 1125 79     Resp 10/22/18 1125 16     Temp 10/22/18 1125 98.2 F (36.8 C)     Temp Source 10/22/18 1125 Temporal  SpO2 10/22/18 1125 97 %     Weight --      Height --      Head Circumference --      Peak Flow --      Pain Score 10/22/18 1122 8     Pain Loc --      Pain Edu? --      Excl. in GC? --    No data found.  Updated Vital Signs BP 123/75 (BP Location: Left Arm)   Pulse 79   Temp 98.2 F (36.8 C) (Temporal)   Resp 16   SpO2 97%      Physical Exam Constitutional:      General: She is not in acute distress.    Appearance: She is well-developed.  HENT:     Head: Normocephalic and atraumatic.  Eyes:     Conjunctiva/sclera: Conjunctivae normal.     Pupils: Pupils are equal, round, and reactive to light.  Neck:     Musculoskeletal: Normal range of motion.  Cardiovascular:     Rate and Rhythm: Normal rate.  Pulmonary:     Effort: Pulmonary effort is normal. No respiratory distress.  Abdominal:     General:  There is no distension.     Palpations: Abdomen is soft.  Musculoskeletal: Normal range of motion.     Comments: Left great toenail has partial subungual hematoma proximally.  There is bleeding from the edge of the nail.  The distal edges intact.  Minimal swelling.  Tuft is tender.  Skin:    General: Skin is warm and dry.  Neurological:     Mental Status: She is alert.      UC Treatments / Results  Labs (all labs ordered are listed, but only abnormal results are displayed) Labs Reviewed - No data to display  EKG None  Radiology Dg Foot Complete Left  Result Date: 10/22/2018 CLINICAL DATA:  Storm door fell onto foot EXAM: LEFT FOOT - COMPLETE 3+ VIEW COMPARISON:  None. FINDINGS: Frontal, oblique, and lateral views obtained. No fracture or dislocation. Joint spaces appear normal. No erosive change. IMPRESSION: No fracture or dislocation.  No evident arthropathy. Electronically Signed   By: Bretta BangWilliam  Woodruff III M.D.   On: 10/22/2018 11:44    Procedures Procedures (including critical care time)  Medications Ordered in UC Medications - No data to display  Initial Impression / Assessment and Plan / UC Course  I have reviewed the triage vital signs and the nursing notes.  Pertinent labs & imaging results that were available during my care of the patient were reviewed by me and considered in my medical decision making (see chart for details).      Final Clinical Impressions(s) / UC Diagnoses   Final diagnoses:  Toe injury, left, initial encounter     Discharge Instructions     Ice Elevate Tylenol for pain    ED Prescriptions    None     Controlled Substance Prescriptions [place] Controlled Substance Registry consulted? Not Applicable   Eustace MooreNelson, Brodan Grewell Sue, MD 10/22/18 1226

## 2019-09-23 ENCOUNTER — Inpatient Hospital Stay (HOSPITAL_COMMUNITY): Payer: Medicaid Other

## 2019-09-23 ENCOUNTER — Emergency Department (HOSPITAL_COMMUNITY): Payer: Medicaid Other

## 2019-09-23 ENCOUNTER — Inpatient Hospital Stay (HOSPITAL_COMMUNITY)
Admission: EM | Admit: 2019-09-23 | Discharge: 2019-09-27 | DRG: 871 | Disposition: A | Discharge status: Home / Self Care | Payer: Medicaid Other | Source: Ambulatory Visit | Attending: Internal Medicine | Admitting: Internal Medicine

## 2019-09-23 ENCOUNTER — Other Ambulatory Visit: Payer: Self-pay

## 2019-09-23 ENCOUNTER — Encounter (HOSPITAL_COMMUNITY): Payer: Self-pay

## 2019-09-23 DIAGNOSIS — R945 Abnormal results of liver function studies: Secondary | ICD-10-CM

## 2019-09-23 DIAGNOSIS — J1282 Pneumonia due to coronavirus disease 2019: Secondary | ICD-10-CM | POA: Diagnosis present

## 2019-09-23 DIAGNOSIS — J9601 Acute respiratory failure with hypoxia: Secondary | ICD-10-CM

## 2019-09-23 DIAGNOSIS — Z79899 Other long term (current) drug therapy: Secondary | ICD-10-CM

## 2019-09-23 DIAGNOSIS — Z91013 Allergy to seafood: Secondary | ICD-10-CM

## 2019-09-23 DIAGNOSIS — E669 Obesity, unspecified: Secondary | ICD-10-CM | POA: Diagnosis present

## 2019-09-23 DIAGNOSIS — J189 Pneumonia, unspecified organism: Secondary | ICD-10-CM | POA: Diagnosis present

## 2019-09-23 DIAGNOSIS — E876 Hypokalemia: Secondary | ICD-10-CM

## 2019-09-23 DIAGNOSIS — R0602 Shortness of breath: Secondary | ICD-10-CM

## 2019-09-23 DIAGNOSIS — Z6824 Body mass index (BMI) 24.0-24.9, adult: Secondary | ICD-10-CM | POA: Diagnosis not present

## 2019-09-23 DIAGNOSIS — B9789 Other viral agents as the cause of diseases classified elsewhere: Secondary | ICD-10-CM | POA: Diagnosis not present

## 2019-09-23 DIAGNOSIS — R739 Hyperglycemia, unspecified: Secondary | ICD-10-CM | POA: Diagnosis present

## 2019-09-23 DIAGNOSIS — A419 Sepsis, unspecified organism: Secondary | ICD-10-CM

## 2019-09-23 DIAGNOSIS — U071 COVID-19: Secondary | ICD-10-CM

## 2019-09-23 DIAGNOSIS — A4189 Other specified sepsis: Secondary | ICD-10-CM | POA: Diagnosis present

## 2019-09-23 DIAGNOSIS — D696 Thrombocytopenia, unspecified: Secondary | ICD-10-CM

## 2019-09-23 DIAGNOSIS — R197 Diarrhea, unspecified: Secondary | ICD-10-CM | POA: Diagnosis present

## 2019-09-23 DIAGNOSIS — I361 Nonrheumatic tricuspid (valve) insufficiency: Secondary | ICD-10-CM | POA: Diagnosis not present

## 2019-09-23 DIAGNOSIS — J159 Unspecified bacterial pneumonia: Secondary | ICD-10-CM | POA: Diagnosis present

## 2019-09-23 DIAGNOSIS — R0902 Hypoxemia: Secondary | ICD-10-CM

## 2019-09-23 DIAGNOSIS — R7989 Other specified abnormal findings of blood chemistry: Secondary | ICD-10-CM

## 2019-09-23 DIAGNOSIS — A609 Anogenital herpesviral infection, unspecified: Secondary | ICD-10-CM | POA: Diagnosis present

## 2019-09-23 HISTORY — DX: Other specified health status: Z78.9

## 2019-09-23 LAB — RESPIRATORY PANEL BY RT PCR (FLU A&B, COVID)
Influenza A by PCR: NEGATIVE
Influenza B by PCR: NEGATIVE
SARS Coronavirus 2 by RT PCR: POSITIVE — AB

## 2019-09-23 LAB — CBC WITH DIFFERENTIAL/PLATELET
Abs Immature Granulocytes: 0.03 10*3/uL (ref 0.00–0.07)
Basophils Absolute: 0 10*3/uL (ref 0.0–0.1)
Basophils Relative: 0 %
Eosinophils Absolute: 0 10*3/uL (ref 0.0–0.5)
Eosinophils Relative: 0 %
HCT: 42.7 % (ref 36.0–46.0)
Hemoglobin: 15 g/dL (ref 12.0–15.0)
Immature Granulocytes: 1 %
Lymphocytes Relative: 11 %
Lymphs Abs: 0.6 10*3/uL — ABNORMAL LOW (ref 0.7–4.0)
MCH: 31.4 pg (ref 26.0–34.0)
MCHC: 35.1 g/dL (ref 30.0–36.0)
MCV: 89.5 fL (ref 80.0–100.0)
Monocytes Absolute: 0.3 10*3/uL (ref 0.1–1.0)
Monocytes Relative: 5 %
Neutro Abs: 4.6 10*3/uL (ref 1.7–7.7)
Neutrophils Relative %: 83 %
Platelets: 149 10*3/uL — ABNORMAL LOW (ref 150–400)
RBC: 4.77 MIL/uL (ref 3.87–5.11)
RDW: 11.9 % (ref 11.5–15.5)
WBC: 5.5 10*3/uL (ref 4.0–10.5)
nRBC: 0 % (ref 0.0–0.2)

## 2019-09-23 LAB — URINALYSIS, ROUTINE W REFLEX MICROSCOPIC
Bilirubin Urine: NEGATIVE
Glucose, UA: NEGATIVE mg/dL
Hgb urine dipstick: NEGATIVE
Ketones, ur: NEGATIVE mg/dL
Nitrite: NEGATIVE
Protein, ur: 100 mg/dL — AB
Specific Gravity, Urine: 1.01 (ref 1.005–1.030)
pH: 7 (ref 5.0–8.0)

## 2019-09-23 LAB — COMPREHENSIVE METABOLIC PANEL
ALT: 61 U/L — ABNORMAL HIGH (ref 0–44)
AST: 77 U/L — ABNORMAL HIGH (ref 15–41)
Albumin: 3.5 g/dL (ref 3.5–5.0)
Alkaline Phosphatase: 54 U/L (ref 38–126)
Anion gap: 11 (ref 5–15)
BUN: 9 mg/dL (ref 6–20)
CO2: 23 mmol/L (ref 22–32)
Calcium: 8.6 mg/dL — ABNORMAL LOW (ref 8.9–10.3)
Chloride: 101 mmol/L (ref 98–111)
Creatinine, Ser: 0.95 mg/dL (ref 0.44–1.00)
GFR calc Af Amer: >60 mL/min (ref >60)
GFR calc non Af Amer: >60 mL/min (ref >60)
Glucose, Bld: 112 mg/dL — ABNORMAL HIGH (ref 70–99)
Potassium: 3.3 mmol/L — ABNORMAL LOW (ref 3.5–5.1)
Sodium: 135 mmol/L (ref 135–145)
Total Bilirubin: 1.3 mg/dL — ABNORMAL HIGH (ref 0.3–1.2)
Total Protein: 8.4 g/dL — ABNORMAL HIGH (ref 6.5–8.1)

## 2019-09-23 LAB — C-REACTIVE PROTEIN: CRP: 14.4 mg/dL — ABNORMAL HIGH (ref <1.0)

## 2019-09-23 LAB — CK TOTAL AND CKMB (NOT AT ARMC)
CK, MB: 0.5 ng/mL (ref 0.5–5.0)
Relative Index: 0.2 (ref 0.0–2.5)
Total CK: 208 U/L (ref 38–234)

## 2019-09-23 LAB — TROPONIN I (HIGH SENSITIVITY)
Troponin I (High Sensitivity): 39 ng/L — ABNORMAL HIGH (ref <18)
Troponin I (High Sensitivity): 70 ng/L — ABNORMAL HIGH (ref <18)

## 2019-09-23 LAB — I-STAT BETA HCG BLOOD, ED (MC, WL, AP ONLY): I-stat hCG, quantitative: <5 m[IU]/mL (ref <5)

## 2019-09-23 LAB — APTT: aPTT: 34 seconds (ref 24–36)

## 2019-09-23 LAB — PROTIME-INR
INR: 1.3 — ABNORMAL HIGH (ref 0.8–1.2)
Prothrombin Time: 15.6 seconds — ABNORMAL HIGH (ref 11.4–15.2)

## 2019-09-23 LAB — SEDIMENTATION RATE: Sed Rate: 59 mm/hr — ABNORMAL HIGH (ref 0–22)

## 2019-09-23 LAB — POC SARS CORONAVIRUS 2 AG -  ED: SARS Coronavirus 2 Ag: NEGATIVE

## 2019-09-23 LAB — BRAIN NATRIURETIC PEPTIDE: B Natriuretic Peptide: 42.4 pg/mL (ref 0.0–100.0)

## 2019-09-23 LAB — LACTIC ACID, PLASMA: Lactic Acid, Venous: 1.6 mmol/L (ref 0.5–1.9)

## 2019-09-23 LAB — LACTATE DEHYDROGENASE: LDH: 193 U/L — ABNORMAL HIGH (ref 98–192)

## 2019-09-23 MED ORDER — MENTHOL 3 MG MT LOZG
1.0000 | LOZENGE | OROMUCOSAL | Status: DC | PRN
  Administered 2019-09-23: 22:00:00 3 mg via ORAL
  Filled 2019-09-23: qty 9

## 2019-09-23 MED ORDER — POTASSIUM CHLORIDE CRYS ER 20 MEQ PO TBCR
40.0000 meq | EXTENDED_RELEASE_TABLET | Freq: Two times a day (BID) | ORAL | Status: AC
  Administered 2019-09-23 – 2019-09-24 (×2): 40 meq via ORAL
  Filled 2019-09-23 (×2): qty 2

## 2019-09-23 MED ORDER — SODIUM CHLORIDE 0.9 % IV SOLN
2.0000 g | Freq: Once | INTRAVENOUS | Status: AC
  Administered 2019-09-23: 2 g via INTRAVENOUS
  Filled 2019-09-23: qty 2

## 2019-09-23 MED ORDER — ASCORBIC ACID 500 MG PO TABS
500.0000 mg | ORAL_TABLET | Freq: Every day | ORAL | Status: DC
  Administered 2019-09-23 – 2019-09-27 (×5): 500 mg via ORAL
  Filled 2019-09-23 (×5): qty 1

## 2019-09-23 MED ORDER — ENOXAPARIN SODIUM 40 MG/0.4ML ~~LOC~~ SOLN
40.0000 mg | SUBCUTANEOUS | Status: DC
  Administered 2019-09-23 – 2019-09-26 (×4): 40 mg via SUBCUTANEOUS
  Filled 2019-09-23 (×4): qty 0.4

## 2019-09-23 MED ORDER — DOCUSATE SODIUM 100 MG PO CAPS
100.0000 mg | ORAL_CAPSULE | Freq: Two times a day (BID) | ORAL | Status: DC
  Administered 2019-09-26 – 2019-09-27 (×2): 100 mg via ORAL
  Filled 2019-09-23 (×5): qty 1

## 2019-09-23 MED ORDER — GUAIFENESIN ER 600 MG PO TB12
1200.0000 mg | ORAL_TABLET | Freq: Two times a day (BID) | ORAL | Status: DC
  Administered 2019-09-23 – 2019-09-27 (×8): 1200 mg via ORAL
  Filled 2019-09-23 (×8): qty 2

## 2019-09-23 MED ORDER — DEXAMETHASONE SODIUM PHOSPHATE 10 MG/ML IJ SOLN
6.0000 mg | INTRAMUSCULAR | Status: DC
  Administered 2019-09-23 – 2019-09-27 (×5): 6 mg via INTRAVENOUS
  Filled 2019-09-23 (×5): qty 1

## 2019-09-23 MED ORDER — INSULIN ASPART 100 UNIT/ML ~~LOC~~ SOLN: 0.0000 [IU] | Freq: Three times a day (TID) | SUBCUTANEOUS | Status: DC

## 2019-09-23 MED ORDER — HYDROCOD POLST-CPM POLST ER 10-8 MG/5ML PO SUER: 5.0000 mL | Freq: Two times a day (BID) | ORAL | Status: DC | PRN

## 2019-09-23 MED ORDER — ZINC SULFATE 220 (50 ZN) MG PO CAPS
220.0000 mg | ORAL_CAPSULE | Freq: Every day | ORAL | Status: DC
  Administered 2019-09-23 – 2019-09-27 (×5): 220 mg via ORAL
  Filled 2019-09-23 (×5): qty 1

## 2019-09-23 MED ORDER — METRONIDAZOLE IN NACL 5-0.79 MG/ML-% IV SOLN
500.0000 mg | Freq: Once | INTRAVENOUS | Status: AC
  Administered 2019-09-23: 500 mg via INTRAVENOUS
  Filled 2019-09-23: qty 100

## 2019-09-23 MED ORDER — PANTOPRAZOLE SODIUM 40 MG PO TBEC
40.0000 mg | DELAYED_RELEASE_TABLET | Freq: Every day | ORAL | Status: DC
  Administered 2019-09-24 – 2019-09-27 (×4): 40 mg via ORAL
  Filled 2019-09-23 (×4): qty 1

## 2019-09-23 MED ORDER — SODIUM CHLORIDE 0.9 % IV SOLN: INTRAVENOUS | Status: AC

## 2019-09-23 MED ORDER — ACETAMINOPHEN 500 MG PO TABS
1000.0000 mg | ORAL_TABLET | Freq: Once | ORAL | Status: AC
  Administered 2019-09-23: 1000 mg via ORAL
  Filled 2019-09-23: qty 2

## 2019-09-23 MED ORDER — VANCOMYCIN HCL IN DEXTROSE 1-5 GM/200ML-% IV SOLN
1000.0000 mg | Freq: Once | INTRAVENOUS | Status: AC
  Administered 2019-09-23: 1000 mg via INTRAVENOUS
  Filled 2019-09-23: qty 200

## 2019-09-23 MED ORDER — IOHEXOL 350 MG/ML SOLN
80.0000 mL | Freq: Once | INTRAVENOUS | Status: AC | PRN
  Administered 2019-09-23: 17:00:00 100 mL via INTRAVENOUS

## 2019-09-23 MED ORDER — SODIUM CHLORIDE 0.9 % IV SOLN
100.0000 mg | INTRAVENOUS | Status: AC
  Filled 2019-09-23 (×2): qty 20

## 2019-09-23 MED ORDER — SODIUM CHLORIDE 0.9 % IV SOLN
100.0000 mg | INTRAVENOUS | Status: AC
  Administered 2019-09-23 (×2): 100 mg via INTRAVENOUS
  Filled 2019-09-23: qty 20

## 2019-09-23 MED ORDER — SODIUM CHLORIDE (PF) 0.9 % IJ SOLN
INTRAMUSCULAR | Status: AC
  Filled 2019-09-23: qty 50

## 2019-09-23 MED ORDER — ONDANSETRON HCL 4 MG/2ML IJ SOLN: 4.0000 mg | Freq: Four times a day (QID) | INTRAMUSCULAR | Status: DC | PRN

## 2019-09-23 MED ORDER — GUAIFENESIN-DM 100-10 MG/5ML PO SYRP
10.0000 mL | ORAL_SOLUTION | ORAL | Status: DC | PRN
  Administered 2019-09-24: 10 mL via ORAL
  Filled 2019-09-23: qty 10

## 2019-09-23 MED ORDER — SODIUM CHLORIDE 0.9 % IV SOLN
200.0000 mg | Freq: Once | INTRAVENOUS | Status: DC
  Filled 2019-09-23: qty 40

## 2019-09-23 MED ORDER — SODIUM CHLORIDE 0.9 % IV SOLN
100.0000 mg | Freq: Every day | INTRAVENOUS | Status: AC
  Administered 2019-09-24 – 2019-09-27 (×4): 100 mg via INTRAVENOUS
  Filled 2019-09-23 (×5): qty 20

## 2019-09-23 MED ORDER — IPRATROPIUM-ALBUTEROL 20-100 MCG/ACT IN AERS
1.0000 | INHALATION_SPRAY | Freq: Four times a day (QID) | RESPIRATORY_TRACT | Status: DC
  Administered 2019-09-23 – 2019-09-27 (×14): 1 via RESPIRATORY_TRACT
  Filled 2019-09-23: qty 4

## 2019-09-23 MED ORDER — ONDANSETRON HCL 4 MG PO TABS: 4.0000 mg | ORAL_TABLET | Freq: Four times a day (QID) | ORAL | Status: DC | PRN

## 2019-09-23 MED ORDER — TRAMADOL HCL 50 MG PO TABS
50.0000 mg | ORAL_TABLET | Freq: Four times a day (QID) | ORAL | Status: DC | PRN
  Administered 2019-09-25: 50 mg via ORAL
  Filled 2019-09-23: qty 1

## 2019-09-23 MED ORDER — SODIUM CHLORIDE 0.9 % IV SOLN: 100.0000 mg | Freq: Every day | INTRAVENOUS | Status: DC

## 2019-09-23 MED ORDER — ACETAMINOPHEN 325 MG PO TABS
650.0000 mg | ORAL_TABLET | Freq: Four times a day (QID) | ORAL | Status: DC | PRN
  Administered 2019-09-23 – 2019-09-24 (×4): 650 mg via ORAL
  Filled 2019-09-23 (×4): qty 2

## 2019-09-23 MED ORDER — ALBUTEROL SULFATE HFA 108 (90 BASE) MCG/ACT IN AERS: 1.0000 | INHALATION_SPRAY | RESPIRATORY_TRACT | Status: DC | PRN

## 2019-09-23 MED ORDER — DEXAMETHASONE SODIUM PHOSPHATE 10 MG/ML IJ SOLN: 6.0000 mg | INTRAMUSCULAR | Status: DC

## 2019-09-23 MED ORDER — SODIUM CHLORIDE 0.9 % IV BOLUS
1000.0000 mL | Freq: Once | INTRAVENOUS | Status: AC
  Administered 2019-09-23: 1000 mL via INTRAVENOUS

## 2019-09-23 NOTE — ED Provider Notes (Signed)
[facility]-EMERGENCY DEPT Provider Note   CSN: [id number] Arrival date & time: 09/23/19  1425     History Chief Complaint  Patient presents with  . Fever  . Shortness of Breath    Brooke Casey is a 29 y.o. female.  Patient presents to the emergency department with a chief complaint of shortness of breath and fever.  She states that she began feeling short of breath on Friday (2 days ago).  She states that she did not know she had a fever until today.  She is noted to be febrile to 103.3 in triage.  She states that she has had worsening shortness of breath.  She denies any productive cough.  Denies swelling on her extremities.  Denies any history of PE or DVT.  Denies any recent long travel, surgery, or immobilization.  She states that she was seen in urgent care, and was told to come to the emergency department for "fluid on my lungs."  She denies any history of rheumatologic disease.  The history is provided by the patient. No language interpreter was used.       Past Medical History:  Diagnosis Date  . HSV (herpes simplex virus) anogenital infection   . Velamentous insertion of umbilical cord     Patient Active Problem List   Diagnosis Date Noted  . Normal postpartum course 08/18/2018  . Normal labor 08/16/2018  . Status post primary low transverse cesarean section 01/31/2017  . Pre-eclampsia, mild 01/31/2017  . LGA (large for gestational age) infant 01/31/2017  . HSV (herpes simplex virus) anogenital infection 01/27/2017  . BMI 40.0-44.9, adult (HCC) 01/27/2017  . Shellfish allergy 01/27/2017    Past Surgical History:  Procedure Laterality Date  . CESAREAN SECTION N/A 01/28/2017   Procedure: CESAREAN SECTION;  Surgeon: Myna Hidalgo, DO;  Location: [facility];  Service: Obstetrics;  Laterality: N/A;  . NO PAST SURGERIES       OB History    Gravida  2   Para  2   Term  2   Preterm      AB      Living  2     SAB      TAB        Ectopic      Multiple  0   Live Births  2           History reviewed. No pertinent family history.  Social History   Tobacco Use  . Smoking status: Never Smoker  . Smokeless tobacco: Never Used  Substance Use Topics  . Alcohol use: No  . Drug use: No    Home Medications Prior to Admission medications   Medication Sig Start Date End Date Taking? Authorizing Provider  ibuprofen (ADVIL,MOTRIN) 600 MG tablet Take 1 tablet (600 mg total) by mouth every 6 (six) hours. 01/31/17   [name], CNM  valACYclovir (VALTREX) 1000 MG tablet Take 1,000 mg by mouth daily. 07/28/18   [provider]    Allergies    Shellfish allergy  Review of Systems   Review of Systems  All other systems reviewed and are negative.   Physical Exam Updated Vital Signs BP 123/86   Pulse (!) 135   Temp (!) 103.3 F (39.6 C) (Oral)   Resp (!) 33   Ht 5\' 5"  (1.651 m)   Wt 68 kg   SpO2 93%   BMI 24.96 kg/m   Physical Exam Vitals and nursing note reviewed.  Constitutional:  General: She is not in acute distress.    Appearance: She is well-developed.  HENT:     Head: Normocephalic and atraumatic.  Eyes:     Conjunctiva/sclera: Conjunctivae normal.  Cardiovascular:     Rate and Rhythm: Normal rate and regular rhythm.     Heart sounds: No murmur.     Comments: No JVD Pulmonary:     Effort: Respiratory distress present.     Breath sounds: Rales present.     Comments: Mild-moderate respiratory distress, tachypea to mid 30s Abdominal:     Palpations: Abdomen is soft.     Tenderness: There is no abdominal tenderness.  Musculoskeletal:     Cervical back: Neck supple.     Right lower leg: No edema.     Left lower leg: No edema.  Skin:    General: Skin is warm and dry.  Neurological:     Mental Status: She is alert and oriented to person, place, and time.  Psychiatric:        Mood and Affect: Mood normal.        Behavior: Behavior normal.     ED Results /  Procedures / Treatments   Labs (all labs ordered are listed, but only abnormal results are displayed) Labs Reviewed  COMPREHENSIVE METABOLIC PANEL - Abnormal; Notable for the following components:      Result Value   Potassium 3.3 (*)    Glucose, Bld 112 (*)    Calcium 8.6 (*)    Total Protein 8.4 (*)    AST 77 (*)    ALT 61 (*)    Total Bilirubin 1.3 (*)    All other components within normal limits  CBC WITH DIFFERENTIAL/PLATELET - Abnormal; Notable for the following components:   Platelets 149 (*)    Lymphs Abs 0.6 (*)    All other components within normal limits  PROTIME-INR - Abnormal; Notable for the following components:   Prothrombin Time 15.6 (*)    INR 1.3 (*)    All other components within normal limits  CULTURE, BLOOD (ROUTINE X 2)  CULTURE, BLOOD (ROUTINE X 2)  URINE CULTURE  RESPIRATORY PANEL BY RT PCR (FLU A&B, COVID)  LACTIC ACID, PLASMA  APTT  LACTIC ACID, PLASMA  URINALYSIS, ROUTINE W REFLEX MICROSCOPIC  SEDIMENTATION RATE  C-REACTIVE PROTEIN  CK TOTAL AND CKMB (NOT AT [facility])  BRAIN NATRIURETIC PEPTIDE  I-STAT BETA HCG BLOOD, ED ([facility], [facility], [facility] ONLY)  POC SARS CORONAVIRUS 2 AG -  ED  TROPONIN I (HIGH SENSITIVITY)    EKG EKG Interpretation  Date/Time:  Sunday September 23 2019 15:03:23 EDT Ventricular Rate:  136 PR Interval:    QRS Duration: 86 QT Interval:  258 QTC Calculation: 388 R Axis:   57 Text Interpretation: Sinus tachycardia Probable left atrial enlargement Borderline T abnormalities, inferior leads Borderline ST elevation, anterior leads No previous ECGs available Confirmed by Richardean Canal ([id number]) on 09/23/2019 3:49:22 PM   Radiology DG Chest Port 1 View  Result Date: 09/23/2019 CLINICAL DATA:  Shortness of breath. Arise from urgent care having COVID symptoms. EXAM: PORTABLE CHEST 1 VIEW COMPARISON:  None FINDINGS: Cardiac enlargement with globular appearance of the heart. This finding is likely also accentuated by the low depth of expansion and  portable technique. No sign of consolidation. No definite pleural effusion. Visualized skeletal structures are unremarkable. IMPRESSION: 1. Marked cardiac enlargement, correlate with any signs of heart failure or volume overload. With globular appearance the presence of pericardial effusion is not  excluded. 2. Vascular congestion without edema. Limited assessment due to body habitus and portable technique. Electronically Signed   By: Zetta Bills M.D.   On: 09/23/2019 15:56    Procedures .Critical Care Performed by: Montine Circle, PA-C Authorized by: Montine Circle, PA-C   Critical care provider statement:    Critical care time (minutes):  50   Critical care was necessary to treat or prevent imminent or life-threatening deterioration of the following conditions:  Respiratory failure and sepsis   Critical care was time spent personally by me on the following activities:  Discussions with consultants, evaluation of patient's response to treatment, examination of patient, ordering and performing treatments and interventions, ordering and review of laboratory studies, ordering and review of radiographic studies, pulse oximetry, re-evaluation of patient's condition, obtaining history from patient or surrogate and review of old charts   (including critical care time)  Medications Ordered in ED Medications  metroNIDAZOLE (FLAGYL) IVPB 500 mg (500 mg Intravenous New Bag/Given 09/23/19 1700)  vancomycin (VANCOCIN) IVPB 1000 mg/200 mL premix (has no administration in time range)  sodium chloride (PF) 0.9 % injection (has no administration in time range)  acetaminophen (TYLENOL) tablet 1,000 mg (1,000 mg Oral Given 09/23/19 1519)  ceFEPIme (MAXIPIME) 2 g in sodium chloride 0.9 % 100 mL IVPB (0 g Intravenous Stopped 09/23/19 1658)  iohexol (OMNIPAQUE) 350 MG/ML injection 80 mL (100 mLs Intravenous Contrast Given 09/23/19 1708)    ED Course  I have reviewed the triage vital signs and the nursing  notes.  Pertinent labs & imaging results that were available during my care of the patient were reviewed by me and considered in my medical decision making (see chart for details).    MDM Rules/Calculators/A&P                      This patient complains of SOB and fever, this involves an extensive number of treatment options, and is a complaint that carries with it a high risk of complications and morbidity.  The differential diagnosis includes COVID, sepsis, PE, pericarditis, myocarditis, pneumonia, new onset CHF.  I ordered, reviewed, and interpreted labs, which included CBC, BMP, lactic acid, troponin, C-reactive protein, sediment Tatian rate, CK-MB, Covid which shows CBC without any significant leukocytosis.  CMP is notable for mild hypokalemia at 3.3, mild transaminitis, AST is 77, ALT 61, T bili is 1.3.  Lactic acid level is 1.6.  Rapid antigen Covid is negative.  Remaining labs are pending. I ordered medication broad-spectrum antibiotics for sepsis/SIRS criteria. I ordered imaging studies which included chest x-ray and CT PE and I independently visualized and interpreted imaging which showed marked cardiac enlargement, possible pericardial effusion, vascular congestion.  PE study pending.  Previous records obtained and reviewed outside medical records from [facility], which the patient brought with her. I consulted Dr. Darl Householder, attending physician and discussed pertinent lab and imaging findings.  Critical interventions: Bedside Echo performed by Dr. Darl Householder, see his note for procedure, which shows no evidence of tamponade or pericardial effusion.  After bedside echo, pericarditis thought to be less likely given the no pericardial effusion.  No leukocytosis, I am less suspicious of pneumonia.  Patient has no history of PE, but given her fever, tachycardia, and hypoxia, will check PE study.  I still have high degree of suspicion for Covid despite rapid antigen being negative.  CT PE is pending.   Lactic acid level is 1.6.  Will hold on weight-based fluid resuscitation based on CXR signs  and normal lactate.  Discussed with Dr. Silverio Lay, who agrees.  After the interventions stated above, I reevaluated the patient and found with improving vitals and breathing better on oxygen.    Appreciate hospitalist for admitting the patient.   Final Clinical Impression(s) / ED Diagnoses Final diagnoses:  Acute respiratory failure with hypoxia (HCC)  Sepsis, due to unspecified organism, unspecified whether acute organ dysfunction present [facility])  Community acquired pneumonia of right lower lobe of lung    Rx / DC Orders ED Discharge Orders    None       Roxy Horseman, PA-C 09/23/19 1739    Charlynne Pander, MD 09/24/19 [phone]

## 2019-09-23 NOTE — H&P (Signed)
History and Physical    Brooke Casey RFF:[id number] DOB: Dec 26, 1990 DOA: 09/23/2019  PCP: Patient, No Pcp Per   Patient coming from: Home via Urgent Care   Chief Complaint: SOB and Fatigue   HPI: Brooke Casey is a 29 y.o. female with medical history significant for no real past medical history except obesity as well as HSV anogenital infection and other comorbidities who presents with chief complaint of shortness of breath, chills, fever, fatigue.  States that she was with her friend on Friday and started developing symptoms.  Attributed to her getting a cold from having sat by window.  Did not realize that she had a fever until today.  She went to urgent care and they sent her to the emergency room for further evaluation.  In the ED she is noted to be febrile with a 103.3 fever.  She denies any history of PE or DVT.  She has had some lightheadedness and did admit to having some diarrhea this morning.  Denies any chest pain, abdominal pain. No other concerns or plans at this time and [facility] was asked admit this patient for sepsis secondary pneumonia and she was found to be COVID-19 positive  ED Course: In the ED she had basic blood work done and had a urinalysis as well as a urine culture and she was given 1 L of normal saline.  Of note her PCR was positive however point-of-care testing was negative.  She also had a chest x-ray and a CTA of the chest as well as a bedside echocardiogram  Review of Systems: As per HPI otherwise all other systems reviewed and negative.   Past Medical History:  Diagnosis Date  . HSV (herpes simplex virus) anogenital infection   . Velamentous insertion of umbilical cord    Past Surgical History:  Procedure Laterality Date  . CESAREAN SECTION N/A 01/28/2017   Procedure: CESAREAN SECTION;  Surgeon: Janyth Pupa, DO;  Location: [facility];  Service: Obstetrics;  Laterality: N/A;  . NO PAST SURGERIES     SOCIAL HISTORY   reports that she has never smoked.  She has never used smokeless tobacco. She reports that she does not drink alcohol or use drugs.  Allergies  Allergen Reactions  . Shellfish Allergy Nausea And Vomiting   FAMILY HISTORY History reviewed. No pertinent family history to the presenting complaint. Patient's Father has HTN and Mother has OSA.   Prior to Admission medications   Medication Sig Start Date End Date Taking? Authorizing Provider  acetaminophen (TYLENOL) 500 MG tablet Take 500 mg by mouth every 6 (six) hours as needed for moderate pain.   Yes [provider]  ibuprofen (ADVIL,MOTRIN) 600 MG tablet Take 1 tablet (600 mg total) by mouth every 6 (six) hours. Patient not taking: Reported on 09/23/2019 01/31/17   Donnel Saxon, CNM   Physical Exam: Vitals:   09/23/19 1730 09/23/19 1800 09/23/19 1830 09/23/19 1900  BP: 113/80 105/74 108/71 103/72  Pulse: (!) 119 (!) 116 (!) 116 (!) 117  Resp: (!) 21 (!) 21 (!) 21 20  Temp:   100 F (37.8 C)   TempSrc:      SpO2: 95% 94% 94% 93%  Weight:      Height:       Constitutional: WN/WD obese African-American female currently in mild respiratory distress appears somewhat uncomfortable sitting up in the bed. Eyes: Lids and conjunctivae normal, sclerae anicteric  ENMT: External Ears, Nose appear normal. Grossly normal hearing. Mucous membranes are moist.  Neck: Appears normal, supple, no cervical masses, normal ROM, no appreciable thyromegaly; difficult to assess JVD given her body habitus Respiratory: Diminished to auscultation bilaterally with coarse breath sounds and some dry crackles.  She has increased respiratory effort and she is wearing supplemental oxygen via nasal cannula Cardiovascular: Tachycardic rate but regular rhythm, no murmurs / rubs / gallops. S1 and S2 auscultated.  Trace lower extremity edema Abdomen: Soft, non-tender, distended secondary body habitus. Bowel sounds positive.  GU: Deferred. Musculoskeletal: No clubbing / cyanosis of digits/nails. No  joint deformity upper and lower extremities.  Skin: No rashes, lesions, ulcers on limited skin evaluation. No induration; Warm and dry.  Neurologic: CN 2-12 grossly intact with no focal deficits. Romberg sign cerebellar reflexes not assessed.  Psychiatric: Normal judgment and insight. Alert and oriented x 3. Normal mood and appropriate affect.   Labs on Admission: I have personally reviewed following labs and imaging studies  CBC: Recent Labs  Lab 09/23/19 1549  WBC 5.5  NEUTROABS 4.6  HGB 15.0  HCT 42.7  MCV 89.5  PLT 176*   Basic Metabolic Panel: Recent Labs  Lab 09/23/19 1549  NA 135  K 3.3*  CL 101  CO2 23  GLUCOSE 112*  BUN 9  CREATININE 0.95  CALCIUM 8.6*   GFR: Estimated Creatinine Clearance: 80 mL/min (by C-G formula based on SCr of 0.95 mg/dL). Liver Function Tests: Recent Labs  Lab 09/23/19 1549  AST 77*  ALT 61*  ALKPHOS 54  BILITOT 1.3*  PROT 8.4*  ALBUMIN 3.5   No results for input(s): LIPASE, AMYLASE in the last 168 hours. No results for input(s): AMMONIA in the last 168 hours. Coagulation Profile: Recent Labs  Lab 09/23/19 1549  INR 1.3*   Cardiac Enzymes: No results for input(s): CKTOTAL, CKMB, CKMBINDEX, TROPONINI in the last 168 hours. BNP (last 3 results) No results for input(s): PROBNP in the last 8760 hours. HbA1C: No results for input(s): HGBA1C in the last 72 hours. CBG: No results for input(s): GLUCAP in the last 168 hours. Lipid Profile: No results for input(s): CHOL, HDL, LDLCALC, TRIG, CHOLHDL, LDLDIRECT in the last 72 hours. Thyroid Function Tests: No results for input(s): TSH, T4TOTAL, FREET4, T3FREE, THYROIDAB in the last 72 hours. Anemia Panel: No results for input(s): VITAMINB12, FOLATE, FERRITIN, TIBC, IRON, RETICCTPCT in the last 72 hours. Urine analysis:    Component Value Date/Time   COLORURINE YELLOW 09/23/2019 [place] 09/23/2019 1727   LABSPEC 1.010 09/23/2019 1727   PHURINE 7.0 09/23/2019  1727   GLUCOSEU NEGATIVE 09/23/2019 1727   HGBUR NEGATIVE 09/23/2019 1727   BILIRUBINUR NEGATIVE 09/23/2019 1727   KETONESUR NEGATIVE 09/23/2019 1727   PROTEINUR 100 (A) 09/23/2019 1727   NITRITE NEGATIVE 09/23/2019 1727   LEUKOCYTESUR TRACE (A) 09/23/2019 1727   Sepsis Labs: !!!!!!!!!!!!!!!!!!!!!!!!!!!!!!!!!!!!!!!!!!!! '@LABRCNTIP' (procalcitonin:4,lacticidven:4) ) Recent Results (from the past 240 hour(s))  Respiratory Panel by RT PCR (Flu A&B, Covid) - Nasopharyngeal Swab     Status: Abnormal   Collection Time: 09/23/19  4:48 PM   Specimen: Nasopharyngeal Swab  Result Value Ref Range Status   SARS Coronavirus 2 by RT PCR POSITIVE (A) NEGATIVE Final    Comment: RESULT CALLED TO, READ BACK BY AND VERIFIED WITH: DR.SHEK,MD [id number] BY V.WILKINS (NOTE) SARS-CoV-2 target nucleic acids are DETECTED. SARS-CoV-2 RNA is generally detectable in upper respiratory specimens  during the acute phase of infection. Positive results are indicative of the presence of the identified virus, but do not rule out bacterial infection  or co-infection with other pathogens not detected by the test. Clinical correlation with patient history and other diagnostic information is necessary to determine patient infection status. The expected result is Negative. Fact Sheet for Patients:  PinkCheek.be Fact Sheet for Healthcare Providers: GravelBags.it This test is not yet approved or cleared by the [place] FDA and  has been authorized for detection and/or diagnosis of SARS-CoV-2 by FDA under an Emergency Use Authorization (EUA).  This EUA will remain in effect (meaning this test can be used) f or the duration of  the COVID-19 declaration under Section 564(b)(1) of the Act, 21 U.S.C. section 360bbb-3(b)(1), unless the authorization is terminated or revoked sooner.    Influenza A by PCR NEGATIVE NEGATIVE Final   Influenza B by PCR NEGATIVE  NEGATIVE Final    Comment: (NOTE) The Xpert Xpress SARS-CoV-2/FLU/RSV assay is intended as an aid in  the diagnosis of influenza from Nasopharyngeal swab specimens and  should not be used as a sole basis for treatment. Nasal washings and  aspirates are unacceptable for Xpert Xpress SARS-CoV-2/FLU/RSV  testing. Fact Sheet for Patients: PinkCheek.be Fact Sheet for Healthcare Providers: GravelBags.it This test is not yet approved or cleared by the [place] FDA and  has been authorized for detection and/or diagnosis of SARS-CoV-2 by  FDA under an Emergency Use Authorization (EUA). This EUA will remain  in effect (meaning this test can be used) for the duration of the  Covid-19 declaration under Section 564(b)(1) of the Act, 21  U.S.C. section 360bbb-3(b)(1), unless the authorization is  terminated or revoked. Performed at [facility], [place] [address] [place], [place] [address]      Radiological Exams on Admission: CT Angio Chest PE W and/or Wo Contrast  Result Date: 09/23/2019 CLINICAL DATA:  Shortness of breath EXAM: CT ANGIOGRAPHY CHEST WITH CONTRAST TECHNIQUE: Multidetector CT imaging of the chest was performed using the standard protocol during bolus administration of intravenous contrast. Multiplanar CT image reconstructions and MIPs were obtained to evaluate the vascular anatomy. CONTRAST:  114m OMNIPAQUE IOHEXOL 350 MG/ML SOLN COMPARISON:  None. FINDINGS: Cardiovascular: Satisfactory opacification of the pulmonary arteries to the segmental level. No evidence of pulmonary embolism. Cardiomegaly. Enlargement of the main pulmonary artery up to 3.5 cm. No pericardial effusion. Mediastinum/Nodes: No enlarged mediastinal, hilar, or axillary lymph nodes. Thyroid gland, trachea, and esophagus demonstrate no significant findings. Lungs/Pleura: There is masslike infrahilar consolidation of the right lung (series 4,  image 68). Small right, trace left pleural effusions and associated atelectasis or consolidation. Upper Abdomen: No acute abnormality. Musculoskeletal: No chest wall abnormality. No acute or significant osseous findings. Review of the MIP images confirms the above findings. IMPRESSION: 1.  Negative examination for pulmonary embolism. 2. There is masslike infrahilar consolidation of the right lung. Given patient age, this is very likely reflects infection, although recommend follow-up radiographs at 6 weeks to document complete resolution and exclude underlying mass. 3. Small right, trace left pleural effusions and associated atelectasis or consolidation. 4.  Cardiomegaly. 5. Enlargement of the main pulmonary artery up to 3.5 cm, as can be seen in pulmonary hypertension. Electronically Signed   By: AEddie CandleM.D.   On: 09/23/2019 17:20   DG Chest Port 1 View  Result Date: 09/23/2019 CLINICAL DATA:  Shortness of breath. Arise from urgent care having COVID symptoms. EXAM: PORTABLE CHEST 1 VIEW COMPARISON:  None FINDINGS: Cardiac enlargement with globular appearance of the heart. This finding is likely also accentuated by the low depth of expansion  and portable technique. No sign of consolidation. No definite pleural effusion. Visualized skeletal structures are unremarkable. IMPRESSION: 1. Marked cardiac enlargement, correlate with any signs of heart failure or volume overload. With globular appearance the presence of pericardial effusion is not excluded. 2. Vascular congestion without edema. Limited assessment due to body habitus and portable technique. Electronically Signed   By: Zetta Bills M.D.   On: 09/23/2019 15:56    EKG: Independently reviewed.  Showed sinus tachycardia rate of 136 and a QTC of 388, there is also left atrial enlargement but no evidence of ST elevation or depression on my interpretation  Assessment/Plan Principal Problem:   Pneumonia due to COVID-19 virus Active Problems:    Pneumonia   Hypokalemia   Abnormal LFTs   Hyperbilirubinemia   Thrombocytopenia (HCC)   Hyperglycemia   Sepsis ([place])   Pneumonia involving right lung   Acute respiratory failure with hypoxia ([place])   COVID-19   COVID-19 virus infection  VIRAL Sepsis 2/2 to COVID 19 Pneumonia with ? Superimposed Bacterial Right Lung PNA -Admit to Inpatient Telemetry -Met Sepsis Criteria as she was febrile, Tachycardic, Tachycpenic and has a source of Infection in the lung -CXR showed "Marked cardiac enlargement, correlate with any signs of heart failure or volume overload. With globular appearance the presence of pericardial effusion is not excluded.  Vascular congestion without edema. Limited assessment due to body habitus and portable technique" -Beside ECHO done by EDP showed no evidence of Tamponade or Pericardial Effusion -CTA of Chest Showed "Negative examination for pulmonary embolism.  There is masslike infrahilar consolidation of the right lung. Given patient age, this is very likely reflects infection, although recommend follow-up radiographs at 6 weeks to document complete resolution and exclude underlying mass.  Small right, trace left pleural effusions and associated atelectasis or consolidation. Cardiomegaly. Enlargement of the main pulmonary artery up to 3.5 cm, as can be seen in pulmonary hypertension." -POC COVID Testing was Negative and PCR is POSITIVE ; States she has not had any COVID Exposures   -Received IV Vancomycin, Cefepime and Flagyl in the ED -Will start CAP coverage with Azithromycin and Ceftriaxone empirically until PCT results -Continue to Monitor and Trend Daily Inflammatory Markers - Recent Labs    09/23/19 1609  CRP 14.4*  -ESR is 59 and the Rest of the Inflammatory Markers ar pending -Based on the D-Dimer may need Full Dose Anticoagulation  Lab Results  Component Value Date   SARSCOV2NAA POSITIVE (A) 09/23/2019   -Received 1 Liter of Fluid in the ED and will start  Maintenance IVF at 75 mL/hr x 1 day in effort not to volume overload her in the setting of COVID  -Repeat CXR in the AM -LA was 1.6; will check PCT -BNP was 42.4 -Check Blood Cx x2; Urinalysis and Urine Cx pending at the time of Admission  -HS Troponin I was mildly elevated at 39 and will trend -C/w Combivent Inhaler scheduled q6h and Albuterol Inhaler q4hprn Wheezing and SOB -C/w Zinc and Vitamin C  -Check Strep and Legionella Ur Ag -Will add Flutter Valve, Incentive Spirometry and Guaifenesin 1200 mg po BID -Repeat CXR in the AM and continue to Monitor Respiratory Status carefully   Acute Respiratory Failure with Hypoxia in the setting of COVID 19-Pneumonia -On Presentation she was hypoxic and had to be placed on supplemental oxygen via nasal cannula -Continuous pulse oximetry and maintain O2 saturation greater than 92% -Continue supplemental oxygen via nasal cannula and wean O2 as tolerated -SpO2: 93 % O2  Flow Rate (L/min): 1 L/min  -Recommend Prone as much as possible  -We will need an ambulatory home O2 screen prior to discharge -Repeat chest x-ray in a.m -Continue treatments as delineated as above  Hypokalemia -Mild at 3.3 -Check Mag Level -Replete with po KCl 40 mEQ BID x2  -Continue to Monitor and Replete as Necessary -Repeat CMP in the AM  Abnormal LFTs -In the setting of Viral Sepsis and COVID Infection -AST was 77 and ALT was 61 -Check RUQ and Acute Hepatitis Panel -Continue to Monitor and Trend Hepatic Fxn Panel -Repeat CMP in the AM  Hyperbilirubinemia -Mild at 1.3 -Checking RUQ U/S -Continue to Monitor and Repeat CMP in AM  Thrombocytopenia -Mild at 149 -In the setting of COVID Infection as above -Continue to Monitor for S/Sx of Bleeding -Repeat CBC in AM   Hyperglycemia -Mild at 112 and in the setting of Above -Expect to worsen in the setting of Steroid Demargination -Check HbA1c to rule out Diabetes -Continue to Monitor and Trend Blood Sugars  carefully and have placed on COVID 19-Glycemic SSI Protocol with Moderate Scale and will need further adjustments   DVT prophylaxis: Enoxaparin 40 mg sq q24h Code Status: FULL CODE  Family Communication: No family present at bedside  Disposition Plan: Admit to inpatient telemetry to the Covid unit Consults called: None Admission status: Inpatient Telemetry   Severity of Illness: The appropriate patient status for this patient is INPATIENT. Inpatient status is judged to be reasonable and necessary in order to provide the required intensity of service to ensure the patient's safety. The patient's presenting symptoms, physical exam findings, and initial radiographic and laboratory data in the context of their chronic comorbidities is felt to place them at high risk for further clinical deterioration. Furthermore, it is not anticipated that the patient will be medically stable for discharge from the hospital within 2 midnights of admission. The following factors support the patient status of inpatient.   " The patient's presenting symptoms include Fever, SOB, Chills, and Fatigue. " The worrisome physical exam findings include Diminished breath sounds " The initial radiographic and laboratory data are worrisome because of CTA findings and COVID 19 Disease. " The chronic co-morbidities are listed as above  * I certify that at the point of admission it is my clinical judgment that the patient will require inpatient hospital care spanning beyond 2 midnights from the point of admission due to high intensity of service, high risk for further deterioration and high frequency of surveillance required.Kerney Elbe, D.O. [facility] PAGER is on [place]  If 7PM-7AM, please contact night-coverage [email or web address]  09/23/2019, 7:20 PM

## 2019-09-23 NOTE — Progress Notes (Signed)
A consult was received from an ED physician for vancomycin and cefepime per pharmacy dosing.  The patient's profile has been reviewed for ht/wt/allergies/indication/available labs.   A one time order has been placed for vancomycin 1g and cefepime 2g (flagyl 500mg  per MD).  Further antibiotics/pharmacy consults should be ordered by admitting physician if indicated.                       Thank you, , PharmD, BCPS Pharmacy: [phone] 09/23/2019  3:52 PM

## 2019-09-23 NOTE — Plan of Care (Signed)
  Problem: Education: Goal: Knowledge of risk factors and measures for prevention of condition will improve Outcome: Progressing   Problem: Coping: Goal: Psychosocial and spiritual needs will be supported Outcome: Progressing   Problem: Respiratory: Goal: Will maintain a patent airway Outcome: Progressing Goal: Complications related to the disease process, condition or treatment will be avoided or minimized Outcome: Progressing   

## 2019-09-23 NOTE — ED Notes (Signed)
Attempted IVx1. Unsuccessful.

## 2019-09-23 NOTE — Progress Notes (Signed)
Per ED RN pt is difficult stick

## 2019-09-23 NOTE — Progress Notes (Signed)
Notified bedside nurse of need to administer antibiotics, draw blood cultures and lactate.

## 2019-09-23 NOTE — ED Triage Notes (Signed)
Pt BIB [facility] from Urgent Care. Pt went there due to having COVID symptoms: fever, SHOB, cough, chills, diarrhea, and body aches. Urgent Care performed EKG, CXR and Rapid COVID test that came back negative. Pt is A&Ox4.

## 2019-09-23 NOTE — ED Notes (Signed)
Pt transported to CT ?

## 2019-09-24 ENCOUNTER — Inpatient Hospital Stay (HOSPITAL_COMMUNITY): Payer: Medicaid Other

## 2019-09-24 DIAGNOSIS — J1282 Pneumonia due to coronavirus disease 2019: Secondary | ICD-10-CM

## 2019-09-24 DIAGNOSIS — U071 COVID-19: Secondary | ICD-10-CM

## 2019-09-24 LAB — CBC WITH DIFFERENTIAL/PLATELET
Abs Immature Granulocytes: 0.06 K/uL (ref 0.00–0.07)
Abs Immature Granulocytes: 0.09 10*3/uL — ABNORMAL HIGH (ref 0.00–0.07)
Basophils Absolute: 0 K/uL (ref 0.0–0.1)
Basophils Absolute: 0 10*3/uL (ref 0.0–0.1)
Basophils Relative: 0 %
Basophils Relative: 0 %
Eosinophils Absolute: 0 K/uL (ref 0.0–0.5)
Eosinophils Absolute: 0 10*3/uL (ref 0.0–0.5)
Eosinophils Relative: 0 %
Eosinophils Relative: 0 %
HCT: 38.1 % (ref 36.0–46.0)
HCT: 33.7 % — ABNORMAL LOW (ref 36.0–46.0)
Hemoglobin: 12.8 g/dL (ref 12.0–15.0)
Hemoglobin: 11.6 g/dL — ABNORMAL LOW (ref 12.0–15.0)
Immature Granulocytes: 1 %
Immature Granulocytes: 1 %
Lymphocytes Relative: 5 %
Lymphocytes Relative: 5 %
Lymphs Abs: 0.4 K/uL — ABNORMAL LOW (ref 0.7–4.0)
Lymphs Abs: 0.4 10*3/uL — ABNORMAL LOW (ref 0.7–4.0)
MCH: 31 pg (ref 26.0–34.0)
MCH: 31.1 pg (ref 26.0–34.0)
MCHC: 33.6 g/dL (ref 30.0–36.0)
MCHC: 34.4 g/dL (ref 30.0–36.0)
MCV: 92.3 fL (ref 80.0–100.0)
MCV: 90.3 fL (ref 80.0–100.0)
Monocytes Absolute: 0.1 K/uL (ref 0.1–1.0)
Monocytes Absolute: 0.2 10*3/uL (ref 0.1–1.0)
Monocytes Relative: 2 %
Monocytes Relative: 3 %
Neutro Abs: 6.5 K/uL (ref 1.7–7.7)
Neutro Abs: 7.3 10*3/uL (ref 1.7–7.7)
Neutrophils Relative %: 92 %
Neutrophils Relative %: 91 %
Platelets: 176 K/uL (ref 150–400)
Platelets: 186 10*3/uL (ref 150–400)
RBC: 4.13 MIL/uL (ref 3.87–5.11)
RBC: 3.73 MIL/uL — ABNORMAL LOW (ref 3.87–5.11)
RDW: 12.5 % (ref 11.5–15.5)
RDW: 12.4 % (ref 11.5–15.5)
WBC: 7.1 K/uL (ref 4.0–10.5)
WBC: 8.1 10*3/uL (ref 4.0–10.5)
nRBC: 0.8 % — ABNORMAL HIGH (ref 0.0–0.2)
nRBC: 0 % (ref 0.0–0.2)

## 2019-09-24 LAB — URINE CULTURE: Culture: NO GROWTH

## 2019-09-24 LAB — COMPREHENSIVE METABOLIC PANEL WITH GFR
ALT: 57 U/L — ABNORMAL HIGH (ref 0–44)
AST: 50 U/L — ABNORMAL HIGH (ref 15–41)
Albumin: 3 g/dL — ABNORMAL LOW (ref 3.5–5.0)
Alkaline Phosphatase: 53 U/L (ref 38–126)
Anion gap: 8 (ref 5–15)
BUN: 8 mg/dL (ref 6–20)
CO2: 21 mmol/L — ABNORMAL LOW (ref 22–32)
Calcium: 8.2 mg/dL — ABNORMAL LOW (ref 8.9–10.3)
Chloride: 105 mmol/L (ref 98–111)
Creatinine, Ser: 0.71 mg/dL (ref 0.44–1.00)
GFR calc Af Amer: >60 mL/min
GFR calc non Af Amer: >60 mL/min
Glucose, Bld: 170 mg/dL — ABNORMAL HIGH (ref 70–99)
Potassium: 3.6 mmol/L (ref 3.5–5.1)
Sodium: 134 mmol/L — ABNORMAL LOW (ref 135–145)
Total Bilirubin: 1.3 mg/dL — ABNORMAL HIGH (ref 0.3–1.2)
Total Protein: 7.7 g/dL (ref 6.5–8.1)

## 2019-09-24 LAB — HEPATITIS PANEL, ACUTE
HCV Ab: NONREACTIVE
Hep A IgM: NONREACTIVE
Hep B C IgM: NONREACTIVE
Hepatitis B Surface Ag: NONREACTIVE

## 2019-09-24 LAB — PROCALCITONIN: Procalcitonin: 2.29 ng/mL

## 2019-09-24 LAB — PROTIME-INR
INR: 1.3 — ABNORMAL HIGH (ref 0.8–1.2)
Prothrombin Time: 16.5 seconds — ABNORMAL HIGH (ref 11.4–15.2)

## 2019-09-24 LAB — BASIC METABOLIC PANEL
Anion gap: 8 (ref 5–15)
BUN: 8 mg/dL (ref 6–20)
CO2: 21 mmol/L — ABNORMAL LOW (ref 22–32)
Calcium: 7.9 mg/dL — ABNORMAL LOW (ref 8.9–10.3)
Chloride: 109 mmol/L (ref 98–111)
Creatinine, Ser: 0.73 mg/dL (ref 0.44–1.00)
GFR calc Af Amer: >60 mL/min (ref >60)
GFR calc non Af Amer: >60 mL/min (ref >60)
Glucose, Bld: 145 mg/dL — ABNORMAL HIGH (ref 70–99)
Potassium: 3.5 mmol/L (ref 3.5–5.1)
Sodium: 138 mmol/L (ref 135–145)

## 2019-09-24 LAB — LACTIC ACID, PLASMA
Lactic Acid, Venous: 2.6 mmol/L (ref 0.5–1.9)
Lactic Acid, Venous: 1.4 mmol/L (ref 0.5–1.9)
Lactic Acid, Venous: 1.6 mmol/L (ref 0.5–1.9)

## 2019-09-24 LAB — GLUCOSE, CAPILLARY
Glucose-Capillary: 103 mg/dL — ABNORMAL HIGH (ref 70–99)
Glucose-Capillary: 97 mg/dL (ref 70–99)
Glucose-Capillary: 96 mg/dL (ref 70–99)
Glucose-Capillary: 107 mg/dL — ABNORMAL HIGH (ref 70–99)

## 2019-09-24 LAB — TROPONIN I (HIGH SENSITIVITY)
Troponin I (High Sensitivity): 2041 ng/L (ref <18)
Troponin I (High Sensitivity): 3256 ng/L (ref <18)
Troponin I (High Sensitivity): 4068 ng/L (ref <18)
Troponin I (High Sensitivity): 4140 ng/L (ref <18)

## 2019-09-24 LAB — FERRITIN: Ferritin: 315 ng/mL — ABNORMAL HIGH (ref 11–307)

## 2019-09-24 LAB — ABO/RH: ABO/RH(D): A POS

## 2019-09-24 LAB — HIV ANTIBODY (ROUTINE TESTING W REFLEX): HIV Screen 4th Generation wRfx: NONREACTIVE

## 2019-09-24 LAB — SEDIMENTATION RATE: Sed Rate: 104 mm/h — ABNORMAL HIGH (ref 0–22)

## 2019-09-24 LAB — PHOSPHORUS: Phosphorus: 2 mg/dL — ABNORMAL LOW (ref 2.5–4.6)

## 2019-09-24 LAB — APTT: aPTT: 36 seconds (ref 24–36)

## 2019-09-24 LAB — MAGNESIUM: Magnesium: 2.1 mg/dL (ref 1.7–2.4)

## 2019-09-24 LAB — C-REACTIVE PROTEIN: CRP: 33.7 mg/dL — ABNORMAL HIGH (ref <1.0)

## 2019-09-24 LAB — D-DIMER, QUANTITATIVE: D-Dimer, Quant: 2.74 ug/mL-FEU — ABNORMAL HIGH (ref 0.00–0.50)

## 2019-09-24 LAB — HEPATITIS B SURFACE ANTIGEN: Hepatitis B Surface Ag: NONREACTIVE

## 2019-09-24 MED ORDER — LACTATED RINGERS IV BOLUS
1000.0000 mL | Freq: Once | INTRAVENOUS | Status: AC
  Administered 2019-09-24: 1000 mL via INTRAVENOUS

## 2019-09-24 MED ORDER — SODIUM CHLORIDE 0.9 % IV BOLUS
1000.0000 mL | Freq: Once | INTRAVENOUS | Status: AC
  Administered 2019-09-24: 1000 mL via INTRAVENOUS

## 2019-09-24 MED ORDER — SODIUM CHLORIDE 0.9 % IV BOLUS
1000.0000 mL | Freq: Once | INTRAVENOUS | Status: AC
  Administered 2019-09-24: 08:00:00 1000 mL via INTRAVENOUS

## 2019-09-24 MED ORDER — MELATONIN 5 MG PO TABS
5.0000 mg | ORAL_TABLET | Freq: Every day | ORAL | Status: DC
  Administered 2019-09-24 – 2019-09-26 (×3): 5 mg via ORAL
  Filled 2019-09-24 (×3): qty 1

## 2019-09-24 MED ORDER — MORPHINE SULFATE (PF) 2 MG/ML IV SOLN
1.0000 mg | INTRAVENOUS | Status: DC | PRN
  Filled 2019-09-24: qty 1

## 2019-09-24 MED ORDER — LACTATED RINGERS IV BOLUS (SEPSIS)
1000.0000 mL | Freq: Once | INTRAVENOUS | Status: DC
  Administered 2019-09-24: 18:00:00 1000 mL via INTRAVENOUS

## 2019-09-24 MED ORDER — METOPROLOL TARTRATE 12.5 MG HALF TABLET
12.5000 mg | ORAL_TABLET | Freq: Two times a day (BID) | ORAL | Status: DC
  Administered 2019-09-24 – 2019-09-27 (×6): 12.5 mg via ORAL
  Filled 2019-09-24 (×6): qty 1

## 2019-09-24 MED ORDER — MORPHINE SULFATE (PF) 2 MG/ML IV SOLN
0.5000 mg | INTRAVENOUS | Status: DC | PRN
  Administered 2019-09-24: 0.5 mg via INTRAVENOUS

## 2019-09-24 MED ORDER — SODIUM CHLORIDE 0.9 % IV SOLN
1.0000 g | Freq: Every day | INTRAVENOUS | Status: DC
  Administered 2019-09-24 – 2019-09-27 (×4): 1 g via INTRAVENOUS
  Filled 2019-09-24: qty 10
  Filled 2019-09-24 (×2): qty 1
  Filled 2019-09-24: qty 10

## 2019-09-24 MED ORDER — LACTATED RINGERS IV BOLUS (SEPSIS): 1000.0000 mL | Freq: Once | INTRAVENOUS | Status: DC

## 2019-09-24 MED ORDER — LACTATED RINGERS IV BOLUS (SEPSIS): 250.0000 mL | Freq: Once | INTRAVENOUS | Status: DC

## 2019-09-24 MED ORDER — SODIUM CHLORIDE 0.9 % IV SOLN
500.0000 mg | Freq: Every day | INTRAVENOUS | Status: DC
  Administered 2019-09-24 – 2019-09-25 (×2): 500 mg via INTRAVENOUS
  Filled 2019-09-24 (×3): qty 500

## 2019-09-24 MED ORDER — MORPHINE BOLUS VIA INFUSION: 1.0000 mg | INTRAVENOUS | Status: DC | PRN

## 2019-09-24 NOTE — Progress Notes (Signed)
PROGRESS NOTE  Brooke Casey WER:[id number] DOB: 23-Apr-1991 DOA: 09/23/2019 PCP: Patient, No Pcp Per  Brief History   Brooke Casey is a 29 y.o. female with medical history significant for no real past medical history except obesity as well as HSV anogenital infection and other comorbidities who presents with chief complaint of shortness of breath, chills, fever, fatigue.  States that she was with her friend on Friday and started developing symptoms.  Attributed to her getting a cold from having sat by window.  Did not realize that she had a fever until today.  She went to urgent care and they sent her to the emergency room for further evaluation.  In the ED she is noted to be febrile with a 103.3 fever.  She denies any history of PE or DVT.  She has had some lightheadedness and did admit to having some diarrhea this morning.  Denies any chest pain, abdominal pain. No other concerns or plans at this time and [facility] was asked admit this patient for sepsis secondary pneumonia and she was found to be COVID-19 positive  ED Course: In the ED she had basic blood work done and had a urinalysis as well as a urine culture and she was given 1 L of normal saline.  Of note her PCR was positive however point-of-care testing was negative.  She also had a chest x-ray and a CTA of the chest as well as a bedside echocardiogram. CTA was negative for pulmonary embolus. CXR demonstrated cardiomegaly and a right lower lobe consolidate.  Consultants  . None  Procedures  . None  Antibiotics   Anti-infectives (From admission, onward)   Start     Dose/Rate Route Frequency Ordered Stop   09/24/19 1000  remdesivir 100 mg in sodium chloride 0.9 % 100 mL IVPB  Status:  Discontinued     100 mg 200 mL/hr over 30 Minutes Intravenous Daily 09/23/19 2025 09/23/19 2054   09/24/19 1000  remdesivir 100 mg in sodium chloride 0.9 % 100 mL IVPB     100 mg 200 mL/hr over 30 Minutes Intravenous Daily 09/23/19 1847 09/28/19 0959    09/23/19 2100  remdesivir 100 mg in sodium chloride 0.9 % 100 mL IVPB     100 mg 200 mL/hr over 30 Minutes Intravenous Every 30 min 09/23/19 2054 09/23/19 2129   09/23/19 2025  remdesivir 200 mg in sodium chloride 0.9% 250 mL IVPB  Status:  Discontinued     200 mg 580 mL/hr over 30 Minutes Intravenous Once 09/23/19 2025 09/23/19 2054   09/23/19 1900  remdesivir 100 mg in sodium chloride 0.9 % 100 mL IVPB     100 mg 200 mL/hr over 30 Minutes Intravenous Every 30 min 09/23/19 1843 09/23/19 2253   09/23/19 1600  ceFEPIme (MAXIPIME) 2 g in sodium chloride 0.9 % 100 mL IVPB     2 g 200 mL/hr over 30 Minutes Intravenous  Once 09/23/19 1547 09/23/19 1658   09/23/19 1600  metroNIDAZOLE (FLAGYL) IVPB 500 mg     500 mg 100 mL/hr over 60 Minutes Intravenous  Once 09/23/19 1547 09/23/19 1825   09/23/19 1600  vancomycin (VANCOCIN) IVPB 1000 mg/200 mL premix     1,000 mg 200 mL/hr over 60 Minutes Intravenous  Once 09/23/19 1547 09/23/19 1926    .  Subjective  The patient is standing at bedside when I enter the room. She is standign with her head bowed and holding her hands on her chest which she staes feels heavy,  particularly when she tries to breathe.  Objective   Vitals:  Vitals:   09/24/19 1236 09/24/19 1355  BP: 109/60 106/62  Pulse: (!) 128 (!) 122  Resp: 20 (!) 22  Temp: (!) 102.9 F (39.4 C) (!) 102.7 F (39.3 C)  SpO2: 96% 99%   Exam:  Constitutional:  . The patient is awake, alert, and oriented x 3. Moderate distress from chest pain. Eyes:  . pupils and irises appear normal . Normal lids and conjunctivae ENMT:  . grossly normal hearing  . Lips appear normal . external ears, nose appear normal . Oropharynx: mucosa, tongue,posterior pharynx appear normal Neck:  . neck appears normal, no masses, normal ROM, supple . no thyromegaly Respiratory:  . Positive for increased work of breathing. . No wheezes, rales, or rhonchi . No tactile fremitus Cardiovascular:  . Regular  rate and rhythm, tahycardic. Marland Kitchen No murmurs, ectopy, or gallups. . No lateral PMI. No thrills. Abdomen:  . Abdomen is soft, non-tender, non-distended . No hernias, masses, or organomegaly . Normoactive bowel sounds.  Musculoskeletal:  . No cyanosis, clubbing, or edema Skin:  . No rashes, lesions, ulcers . palpation of skin: no induration or nodules Neurologic:  . CN 2-12 intact . Sensation all 4 extremities intact Psychiatric:  . Mental status o Mood, affect appropriate o Orientation to person, place, time  . judgment and insight appear intact  I have personally reviewed the following:   Today's Data  . Vitals, BMP, CBC, Ferritin, CRP, LDH, Troponins  Micro Data  . Blood culture x 2: No growth  Imaging  . CTA chest  Cardiology Data  . EKG   Scheduled Meds: . vitamin C  500 mg Oral Daily  . dexamethasone (DECADRON) injection  6 mg Intravenous Q24H  . docusate sodium  100 mg Oral BID  . enoxaparin (LOVENOX) injection  40 mg Subcutaneous Q24H  . guaiFENesin  1,200 mg Oral BID  . insulin aspart  0-15 Units Subcutaneous TID WC  . Ipratropium-Albuterol  1 puff Inhalation Q6H  . metoprolol tartrate  12.5 mg Oral BID  . pantoprazole  40 mg Oral Daily  . zinc sulfate  220 mg Oral Daily   Continuous Infusions: . sodium chloride 75 mL/hr at 09/23/19 2136  . remdesivir 100 mg in NS 100 mL 100 mg (09/24/19 1026)    Principal Problem:   Pneumonia due to COVID-19 virus Active Problems:   Pneumonia   Hypokalemia   Abnormal LFTs   Hyperbilirubinemia   Thrombocytopenia (HCC)   Hyperglycemia   Sepsis ([place])   Pneumonia involving right lung   Acute respiratory failure with hypoxia ([place])   COVID-19   COVID-19 virus infection   LOS: 1 day   A & P   VIRAL Sepsis 2/2 to COVID 19 Pneumonia with ? Superimposed Bacterial Right Lung PNA: The patient was admitted to inpatient telemetry bed. She met sepsis criteria on admission due to fever, tachycardia, tachypnia due to  pneumonia. There was pericardial effusion on echocardiogram without evidence of tamponade.  CXTA demonstrated no pulonary embolus. But did demonstrate mass-like infrahilar consolidate of the right lower lobe. Recommendation is to follow this with follow up CT of th chest in 6 weeks to exclude underlying mass. There is also some evidence of pulmonary hypertension. PCR for COVID was positive. She received vancomycin, cefepime, and Flagyl in the ED. This was changed to coverage for CAP with azithromycin and Rocephin on admission.   The patient has been started on remdesevir and steroids.  Lactic acid was 1.6 and PCT was negative. UCx, Blood culture x 2, and UA were negative. Initial troponin was 39. Follow up troponin was 70. She will receive zinc and vitamin C. She is on a combivent inhaler as well as prn albuterol. Strep and Legionella antigen are pending. She is also on flutter valve, incentive spirometry, and guaifenesin.   Acute Respiratory Failure with Hypoxia in the setting of COVID 19-Pneumonia: Pt was hypoxic on presentation and initially was saturating in the low to mid nineties on room air. However she soon was found to be hypoxic. This has worsenned overnight. The patient is now requiring 3L O2 by [place] to maintain SaO2  In the low nineties. Repeat CXR demonstrates atelectasis. Prone positioning is recommended as much as possible.   Hypokalemia: Improved to 3.6 from 3.3. Magnesium level is within normal limits. Monitor.  Abnormal LFTs: Elevation likely due to acute phase reactant in the setting of Viral Sepsis and COVID Infection. Monitor and trend liver function tests. Right upper quadrant ultrasound demonstrated only a fatty liver. CBD was normal.   Thrombocytopenia: Mild at 149 in the setting of COVID Infection as above. Monitor for S/Sx of Bleeding. Repeat CBC in AM.  Hyperglycemia: Likely reactive in the setting of COVID-19. This may be expected to worsen in the setting of Steroid  Demargination. HbA1c is pending. Glucose will be followed by FSBS and covered with SSI.  I have seen and examined this patient myself.   DVT prophylaxis: Enoxaparin 40 mg sq q24h Code Status: FULL CODE  Family Communication: No family present at bedside  Disposition Plan: Admit to inpatient telemetry to the Covid unit  Brooke Balthazar, DO [facility] Direct contact: see [email or web address]  7PM-7AM contact night coverage as above 09/24/2019, 2:12 PM  LOS: 1 day   ADDENDUM: Follow up troponins ordered as a result of the patient's complaints of chest heaviness and the abnormal appearance of the EKG ordered this morning demonstrated a sharp increase from 70 to 2040. Cardiology was consulted. Shortly after this result was returned she complained of worsening chest pain and shortness of breath. A Rapid Response was called. EKG was repeated, but was not significantly different. She was given low does metoprolol as well as morphine. Echocardiogram, repeat CXR and lab work was also ordered.

## 2019-09-24 NOTE — Consult Note (Signed)
Cardiology Consultation:   Patient ID: Brooke Casey; [id number]; [birth year]   Admit date: 09/23/2019 Date of Consult: 09/24/2019  Primary Care Provider: Patient, No Pcp Per Primary Cardiologist: No primary care provider on file.  Primary Electrophysiologist:  None   Patient Profile:   Brooke Casey is a 29 y.o. female without cardiac who is being seen today for the evaluation of chest pain at the request of Dr. Briant Cedar.  History of Present Illness:   [name] has no past cardiac history.  She was admitted yesterday with cough, SOB and is found to have pneumonia secondary to COVID.  She complained of chest pain and Trop was elevated at 2041.  She did have cardiomegaly on CXR. However, there is a mention in the notes that bedside echo was without pericardial effusion.   EKG with sinus tach but no acute ST changes.  The patient has no past cardiac history.  She has 2 small children.  This keeps her active.  She does not exercise routinely.  She was well until Sunday.  She typically does not have chest pressure, neck or arm discomfort.  She typically does not have shortness of breath, PND or orthopnea.  She denies any palpitations, presyncope or syncope.  Since getting sick yesterday she says she has a slightly dull pain.  It is 6 out of 10 in intensity.  Is worse with deep inspiration.  She had a dry cough.  She has not had any new PND or orthopnea but she is short of breath.  She had no weight gain or edema.  She is never had any prior cardiac testing.  Past Medical History:  Diagnosis Date  . HSV (herpes simplex virus) anogenital infection   . Medical history non-contributory   . Velamentous insertion of umbilical cord     Past Surgical History:  Procedure Laterality Date  . CESAREAN SECTION N/A 01/28/2017   Procedure: CESAREAN SECTION;  Surgeon: Myna Hidalgo, DO;  Location: [facility];  Service: Obstetrics;  Laterality: N/A;  . NO PAST SURGERIES       Home Medications:    Prior to Admission medications   Medication Sig Start Date End Date Taking? Authorizing Provider  acetaminophen (TYLENOL) 500 MG tablet Take 500 mg by mouth every 6 (six) hours as needed for moderate pain.   Yes [provider]  ibuprofen (ADVIL,MOTRIN) 600 MG tablet Take 1 tablet (600 mg total) by mouth every 6 (six) hours. Patient not taking: Reported on 09/23/2019 01/31/17   [name], CNM    Inpatient Medications: Scheduled Meds: . vitamin C  500 mg Oral Daily  . dexamethasone (DECADRON) injection  6 mg Intravenous Q24H  . docusate sodium  100 mg Oral BID  . enoxaparin (LOVENOX) injection  40 mg Subcutaneous Q24H  . guaiFENesin  1,200 mg Oral BID  . insulin aspart  0-15 Units Subcutaneous TID WC  . Ipratropium-Albuterol  1 puff Inhalation Q6H  . metoprolol tartrate  12.5 mg Oral BID  . pantoprazole  40 mg Oral Daily  . zinc sulfate  220 mg Oral Daily   Continuous Infusions: . sodium chloride 75 mL/hr at 09/23/19 2136  . remdesivir 100 mg in NS 100 mL 100 mg (09/24/19 1026)   PRN Meds: acetaminophen, albuterol, chlorpheniramine-HYDROcodone, guaiFENesin-dextromethorphan, menthol-cetylpyridinium, morphine injection, ondansetron **OR** ondansetron (ZOFRAN) IV, traMADol  Allergies:    Allergies  Allergen Reactions  . Shellfish Allergy Nausea And Vomiting    Social History:   Social History   Socioeconomic History  .  Marital status: Single    Spouse name: Not on file  . Number of children: Not on file  . Years of education: Not on file  . Highest education level: Not on file  Occupational History  . Not on file  Tobacco Use  . Smoking status: Never Smoker  . Smokeless tobacco: Never Used  Substance and Sexual Activity  . Alcohol use: No  . Drug use: No  . Sexual activity: Yes    Birth control/protection: Implant  Other Topics Concern  . Not on file  Social History Narrative  . Not on file   Social Determinants of Health   Financial Resource Strain:    . Difficulty of Paying Living Expenses:   Food Insecurity:   . Worried About Charity fundraiser in the Last Year:   . Arboriculturist in the Last Year:   Transportation Needs:   . Film/video editor (Medical):   Marland Kitchen Lack of Transportation (Non-Medical):   Physical Activity:   . Days of Exercise per Week:   . Minutes of Exercise per Session:   Stress:   . Feeling of Stress :   Social Connections:   . Frequency of Communication with Friends and Family:   . Frequency of Social Gatherings with Friends and Family:   . Attends Religious Services:   . Active Member of Clubs or Organizations:   . Attends Archivist Meetings:   Marland Kitchen Marital Status:   Intimate Partner Violence:   . Fear of Current or Ex-Partner:   . Emotionally Abused:   Marland Kitchen Physically Abused:   . Sexually Abused:     Family History:   History reviewed. No pertinent family history.   ROS:  Please see the history of present illness.  As stated in the HPI and negative for all other systems.  Physical Exam/Data:   Vitals:   09/24/19 1022 09/24/19 1127 09/24/19 1236 09/24/19 1355  BP: 111/68 111/61 109/60 106/62  Pulse: (!) 128 (!) 126 (!) 128 (!) 122  Resp:   20   Temp: 100.1 F (37.8 C) (!) 102.8 F (39.3 C) (!) 102.9 F (39.4 C) (!) 102.7 F (39.3 C)  TempSrc: Oral Oral Oral Oral  SpO2: 96% 98% 96% 99%  Weight:      Height:        Intake/Output Summary (Last 24 hours) at 09/24/2019 1356 Last data filed at 09/23/2019 1926 Gross per 24 hour  Intake 690.13 ml  Output --  Net 690.13 ml   Filed Weights   09/23/19 1459  Weight: 68 kg   Body mass index is 24.96 kg/m.  GENERAL:  Well appearing HEENT:  Pupils equal round and reactive, fundi not visualized, oral mucosa unremarkable NECK:  No jugular venous distention, waveform within normal limits, carotid upstroke brisk and symmetric, no bruits, no thyromegaly LYMPHATICS:  No cervical, inguinal adenopathy LUNGS:  Clear to auscultation  bilaterally BACK:  No CVA tenderness CHEST:  Unremarkable HEART:  PMI not displaced or sustained,S1 and S2 within normal limits, no S3, no S4, no clicks, no rubs, no murmurs ABD:  Flat, positive bowel sounds normal in frequency in pitch, no bruits, no rebound, no guarding, no midline pulsatile mass, no hepatomegaly, no splenomegaly EXT:  2 plus pulses throughout, no edema, no cyanosis no clubbing SKIN:  No rashes no nodules NEURO:  Cranial nerves II through XII grossly intact, motor grossly intact throughout PSYCH:  Cognitively intact, oriented to person place and time  EKG:  The EKG was personally reviewed and demonstrates:  ST rate 133.  No acute ST T wave changes.    Telemetry:  Telemetry was personally reviewed and demonstrates:  Sinus tach  Relevant CV Studies: Echo ordered  Laboratory Data:  Chemistry Recent Labs  Lab 09/23/19 1549 09/24/19 0426  NA 135 134*  K 3.3* 3.6  CL 101 105  CO2 23 21*  GLUCOSE 112* 170*  BUN 9 8  CREATININE 0.95 0.71  CALCIUM 8.6* 8.2*  GFRNONAA >60 >60  GFRAA >60 >60  ANIONGAP 11 8    Recent Labs  Lab 09/23/19 1549 09/24/19 0426  PROT 8.4* 7.7  ALBUMIN 3.5 3.0*  AST 77* 50*  ALT 61* 57*  ALKPHOS 54 53  BILITOT 1.3* 1.3*   Hematology Recent Labs  Lab 09/23/19 1549 09/24/19 0426  WBC 5.5 7.1  RBC 4.77 4.13  HGB 15.0 12.8  HCT 42.7 38.1  MCV 89.5 92.3  MCH 31.4 31.0  MCHC 35.1 33.6  RDW 11.9 12.5  PLT 149* 176   Cardiac EnzymesNo results for input(s): TROPONINI in the last 168 hours. No results for input(s): TROPIPOC in the last 168 hours.  BNP Recent Labs  Lab 09/23/19 1616  BNP 42.4    DDimer  Recent Labs  Lab 09/24/19 0426  DDIMER 2.74*    Radiology/Studies:  CT Angio Chest PE W and/or Wo Contrast  Result Date: 09/23/2019 CLINICAL DATA:  Shortness of breath EXAM: CT ANGIOGRAPHY CHEST WITH CONTRAST TECHNIQUE: Multidetector CT imaging of the chest was performed using the standard protocol during bolus  administration of intravenous contrast. Multiplanar CT image reconstructions and MIPs were obtained to evaluate the vascular anatomy. CONTRAST:  OMNIPAQUE IOHEXOL 350 MG/ML SOLN COMPARISON:  None. FINDINGS: Cardiovascular: Satisfactory opacification of the pulmonary arteries to the segmental level. No evidence of pulmonary embolism. Cardiomegaly. Enlargement of the main pulmonary artery up to 3.5 cm. No pericardial effusion. Mediastinum/Nodes: No enlarged mediastinal, hilar, or axillary lymph nodes. Thyroid gland, trachea, and esophagus demonstrate no significant findings. Lungs/Pleura: There is masslike infrahilar consolidation of the right lung (series 4, image 68). Small right, trace left pleural effusions and associated atelectasis or consolidation. Upper Abdomen: No acute abnormality. Musculoskeletal: No chest wall abnormality. No acute or significant osseous findings. Review of the MIP images confirms the above findings. IMPRESSION: 1.  Negative examination for pulmonary embolism. 2. There is masslike infrahilar consolidation of the right lung. Given patient age, this is very likely reflects infection, although recommend follow-up radiographs at 6 weeks to document complete resolution and exclude underlying mass. 3. Small right, trace left pleural effusions and associated atelectasis or consolidation. 4.  Cardiomegaly. 5. Enlargement of the main pulmonary artery up to 3.5 cm, as can be seen in pulmonary hypertension. Electronically Signed   By: Lauralyn Primes M.D.   On: 09/23/2019 17:20   DG CHEST PORT 1 VIEW  Result Date: 09/24/2019 CLINICAL DATA:  COVID-19 positive, hypoxia EXAM: PORTABLE CHEST 1 VIEW COMPARISON:  09/24/2019 at 5:45 a.m. FINDINGS: Single frontal view of the chest demonstrates persistent enlargement of the cardiac silhouette. The lung volumes are diminished, with continued bibasilar consolidation right greater than left. Trace right pleural effusion unchanged. No pneumothorax. Right  hilar prominence consistent with soft tissue density on recent CT, possibly reflecting adenopathy. IMPRESSION: 1. Low lung volumes, with bibasilar consolidation most consistent with atelectasis. 2. Continued right hilar prominence, likely reflecting adenopathy. 3. Trace right pleural effusion. Electronically Signed   By: Sharlet Salina M.D.   On:  09/24/2019 13:50   Portable chest 1 View  Result Date: 09/24/2019 CLINICAL DATA:  Shortness of breath EXAM: PORTABLE CHEST 1 VIEW COMPARISON:  September 23, 2019 FINDINGS: There is a small right pleural effusion. There is bibasilar airspace opacity. Lungs elsewhere are clear. Heart is enlarged with pulmonary vascularity normal. No adenopathy. No bone lesions. IMPRESSION: Bibasilar airspace opacity, likely due to pneumonia. Small right pleural effusion. No new opacity. There is cardiomegaly with pulmonary vascularity normal. No adenopathy appreciable. Electronically Signed   By: Bretta Bang III M.D.   On: 09/24/2019 08:21   DG Chest Port 1 View  Result Date: 09/23/2019 CLINICAL DATA:  Shortness of breath. Arise from urgent care having COVID symptoms. EXAM: PORTABLE CHEST 1 VIEW COMPARISON:  None FINDINGS: Cardiac enlargement with globular appearance of the heart. This finding is likely also accentuated by the low depth of expansion and portable technique. No sign of consolidation. No definite pleural effusion. Visualized skeletal structures are unremarkable. IMPRESSION: 1. Marked cardiac enlargement, correlate with any signs of heart failure or volume overload. With globular appearance the presence of pericardial effusion is not excluded. 2. Vascular congestion without edema. Limited assessment due to body habitus and portable technique. Electronically Signed   By: Donzetta Kohut M.D.   On: 09/23/2019 15:56   US Abdomen Limited RUQ  Result Date: 09/23/2019 CLINICAL DATA:  29 year old female with abdominal LFTs. EXAM: ULTRASOUND ABDOMEN LIMITED RIGHT UPPER  QUADRANT COMPARISON:  None. FINDINGS: Gallbladder: No gallstones or wall thickening visualized. No sonographic Murphy sign noted by sonographer. Common bile duct: Diameter: 4 mm Liver: There is diffuse increased liver echogenicity most commonly seen in the setting of fatty infiltration. Superimposed inflammation or fibrosis is not excluded. Clinical correlation is recommended. portal vein is patent on color Doppler imaging with normal direction of blood flow towards the liver. Other: None. IMPRESSION: Fatty liver, otherwise unremarkable right upper quadrant ultrasound. Electronically Signed   By: Elgie Collard M.D.   On: 09/23/2019 20:13    Assessment and Plan:   Chest pain: Her chest pain is atypical and consistent with a pleuritic pain.  There is no evidence of pericarditis.  However, I will check an echocardiogram.  She can have another set of enzymes to make sure there is a trend downward.  However, the possibility of obstructive coronary disease as etiology is very unlikely.  Direct myocardial involvement is thought to be somewhere between 0.7 and 2% in patients with Covid and so unlikely.  Certainly could be demand ischemia.  However, it is unlikely she has any obstructive coronary disease.  I will probably follow this as an outpatient.  There is no evidence for DVT on clinical exam and she has been ambulatory.   For questions or updates, please contact [facility] Please consult [email or web address] for contact info under Cardiology/STEMI.   Signed, Rollene Rotunda, MD  09/24/2019 1:56 PM

## 2019-09-24 NOTE — TOC Progression Note (Addendum)
Transition of Care [facility]) - Progression Note    Patient Details  Name: Brooke Casey MRN: [id number] Date of Birth: March 05, 1991  [facility] CM/SW Contact  Geni Bers, RN Phone Number: 09/24/2019, 2:16 PM  Clinical Narrative:    Pt plan to discharge home when stable.    Expected Discharge Plan: Home/Self Care Barriers to Discharge: No Barriers Identified  Expected Discharge Plan and Services Expected Discharge Plan: Home/Self Care       Living arrangements for the past 2 months: Single Family Home                                       Social Determinants of Health (SDOH) Interventions    Readmission Risk Interventions No flowsheet data found.

## 2019-09-24 NOTE — Progress Notes (Signed)
Spiritual care received consult for advance directive.    The completion of advance directive requires notary with two witnesses who are not pt's healthcare providers.  Due to pt's admission to COVID unit and visitor restriction, we are not able to complete notarization of document.  Will follow up if pt is moved to another unit where we have access to witnesses.

## 2019-09-25 ENCOUNTER — Inpatient Hospital Stay (HOSPITAL_COMMUNITY): Payer: Medicaid Other

## 2019-09-25 DIAGNOSIS — I361 Nonrheumatic tricuspid (valve) insufficiency: Secondary | ICD-10-CM

## 2019-09-25 LAB — D-DIMER, QUANTITATIVE: D-Dimer, Quant: 2.77 ug/mL-FEU — ABNORMAL HIGH (ref 0.00–0.50)

## 2019-09-25 LAB — FERRITIN: Ferritin: 335 ng/mL — ABNORMAL HIGH (ref 11–307)

## 2019-09-25 LAB — SEDIMENTATION RATE: Sed Rate: 99 mm/hr — ABNORMAL HIGH (ref 0–22)

## 2019-09-25 LAB — PHOSPHORUS: Phosphorus: 2.4 mg/dL — ABNORMAL LOW (ref 2.5–4.6)

## 2019-09-25 LAB — COMPREHENSIVE METABOLIC PANEL
ALT: 49 U/L — ABNORMAL HIGH (ref 0–44)
AST: 50 U/L — ABNORMAL HIGH (ref 15–41)
Albumin: 2.6 g/dL — ABNORMAL LOW (ref 3.5–5.0)
Alkaline Phosphatase: 47 U/L (ref 38–126)
Anion gap: 7 (ref 5–15)
BUN: 9 mg/dL (ref 6–20)
CO2: 21 mmol/L — ABNORMAL LOW (ref 22–32)
Calcium: 7.9 mg/dL — ABNORMAL LOW (ref 8.9–10.3)
Chloride: 109 mmol/L (ref 98–111)
Creatinine, Ser: 0.65 mg/dL (ref 0.44–1.00)
GFR calc Af Amer: >60 mL/min (ref >60)
GFR calc non Af Amer: >60 mL/min (ref >60)
Glucose, Bld: 140 mg/dL — ABNORMAL HIGH (ref 70–99)
Potassium: 3.8 mmol/L (ref 3.5–5.1)
Sodium: 137 mmol/L (ref 135–145)
Total Bilirubin: 0.5 mg/dL (ref 0.3–1.2)
Total Protein: 7.1 g/dL (ref 6.5–8.1)

## 2019-09-25 LAB — CBC WITH DIFFERENTIAL/PLATELET
Abs Immature Granulocytes: 0.19 10*3/uL — ABNORMAL HIGH (ref 0.00–0.07)
Basophils Absolute: 0 10*3/uL (ref 0.0–0.1)
Basophils Relative: 0 %
Eosinophils Absolute: 0 10*3/uL (ref 0.0–0.5)
Eosinophils Relative: 0 %
HCT: 32.4 % — ABNORMAL LOW (ref 36.0–46.0)
Hemoglobin: 10.9 g/dL — ABNORMAL LOW (ref 12.0–15.0)
Immature Granulocytes: 2 %
Lymphocytes Relative: 8 %
Lymphs Abs: 0.7 10*3/uL (ref 0.7–4.0)
MCH: 30.3 pg (ref 26.0–34.0)
MCHC: 33.6 g/dL (ref 30.0–36.0)
MCV: 90 fL (ref 80.0–100.0)
Monocytes Absolute: 0.2 10*3/uL (ref 0.1–1.0)
Monocytes Relative: 3 %
Neutro Abs: 7.7 10*3/uL (ref 1.7–7.7)
Neutrophils Relative %: 87 %
Platelets: 168 10*3/uL (ref 150–400)
RBC: 3.6 MIL/uL — ABNORMAL LOW (ref 3.87–5.11)
RDW: 12.7 % (ref 11.5–15.5)
WBC: 8.9 10*3/uL (ref 4.0–10.5)
nRBC: 0 % (ref 0.0–0.2)

## 2019-09-25 LAB — GLUCOSE, CAPILLARY
Glucose-Capillary: 110 mg/dL — ABNORMAL HIGH (ref 70–99)
Glucose-Capillary: 108 mg/dL — ABNORMAL HIGH (ref 70–99)
Glucose-Capillary: 117 mg/dL — ABNORMAL HIGH (ref 70–99)
Glucose-Capillary: 104 mg/dL — ABNORMAL HIGH (ref 70–99)

## 2019-09-25 LAB — ECHOCARDIOGRAM LIMITED
Height: 65 in
Weight: 2400 oz

## 2019-09-25 LAB — PROCALCITONIN: Procalcitonin: 1.87 ng/mL

## 2019-09-25 LAB — MAGNESIUM: Magnesium: 2 mg/dL (ref 1.7–2.4)

## 2019-09-25 LAB — TROPONIN I (HIGH SENSITIVITY): Troponin I (High Sensitivity): 4025 ng/L (ref <18)

## 2019-09-25 LAB — C-REACTIVE PROTEIN: CRP: 28.4 mg/dL — ABNORMAL HIGH (ref <1.0)

## 2019-09-25 MED ORDER — ALUM & MAG HYDROXIDE-SIMETH 200-200-20 MG/5ML PO SUSP
30.0000 mL | Freq: Four times a day (QID) | ORAL | Status: DC | PRN
  Administered 2019-09-25 (×2): 30 mL via ORAL
  Filled 2019-09-25 (×2): qty 30

## 2019-09-25 NOTE — Plan of Care (Signed)
  Problem: Education: Goal: Knowledge of risk factors and measures for prevention of condition will improve Outcome: Progressing   Problem: Coping: Goal: Psychosocial and spiritual needs will be supported Outcome: Progressing   Problem: Respiratory: Goal: Will maintain a patent airway Outcome: Progressing Goal: Complications related to the disease process, condition or treatment will be avoided or minimized Outcome: Progressing   

## 2019-09-25 NOTE — Progress Notes (Signed)
  Echocardiogram 2D Echocardiogram has been performed.  Brooke Casey 09/25/2019, 10:27 AM

## 2019-09-25 NOTE — Progress Notes (Signed)
Pt off monitor to use bathroom.  CCMD called and notified.  Up on return to monitor pt still tachy @ 110, O2 92% in no distress.

## 2019-09-25 NOTE — Progress Notes (Signed)
PROGRESS NOTE  Brooke Casey DSK:[id number] DOB: 1991/03/11 DOA: 09/23/2019 PCP: Patient, No Pcp Per  Brief History   Brooke Casey is a 29 y.o. female with medical history significant for no real past medical history except obesity as well as HSV anogenital infection and other comorbidities who presents with chief complaint of shortness of breath, chills, fever, fatigue.  States that she was with her friend on Friday and started developing symptoms.  Attributed to her getting a cold from having sat by window.  Did not realize that she had a fever until today.  She went to urgent care and they sent her to the emergency room for further evaluation.  In the ED she is noted to be febrile with a 103.3 fever.  She denies any history of PE or DVT.  She has had some lightheadedness and did admit to having some diarrhea this morning.  Denies any chest pain, abdominal pain. No other concerns or plans at this time and [facility] was asked admit this patient for sepsis secondary pneumonia and she was found to be COVID-19 positive  ED Course: In the ED she had basic blood work done and had a urinalysis as well as a urine culture and she was given 1 L of normal saline.  Of note her PCR was positive however point-of-care testing was negative.  She also had a chest x-ray and a CTA of the chest as well as a bedside echocardiogram. CTA was negative for pulmonary embolus. CXR demonstrated cardiomegaly and a right lower lobe consolidate.  [facility] were consulted to admit the patient for further evaluation and treatment. On 09/24/2019 the patient developed chest pain and had an abrupt increase in troponin from 70 to more than 4000. MI was ruled out, and cardiology was consulted. Echocardiogram was obtained. It demonstrated EF of 50-55% with low normal function and no regional wall motion abnormalities. LV diastolic function could not be evaluated.  Cardiology feels that elevation of troponin was due to demand  ischemia secondary to hypoxia and that chest pain was pleuritic and related to COVID pneumonia.  Consultants  . Cardiology  Procedures  . None  Antibiotics   Anti-infectives (From admission, onward)   Start     Dose/Rate Route Frequency Ordered Stop   09/24/19 1800  cefTRIAXone (ROCEPHIN) 1 g in sodium chloride 0.9 % 100 mL IVPB     1 g 200 mL/hr over 30 Minutes Intravenous Daily 09/24/19 1739     09/24/19 1800  azithromycin (ZITHROMAX) 500 mg in sodium chloride 0.9 % 250 mL IVPB     500 mg 250 mL/hr over 60 Minutes Intravenous Daily 09/24/19 1739     09/24/19 1000  remdesivir 100 mg in sodium chloride 0.9 % 100 mL IVPB  Status:  Discontinued     100 mg 200 mL/hr over 30 Minutes Intravenous Daily 09/23/19 2025 09/23/19 2054   09/24/19 1000  remdesivir 100 mg in sodium chloride 0.9 % 100 mL IVPB     100 mg 200 mL/hr over 30 Minutes Intravenous Daily 09/23/19 1847 09/28/19 0959   09/23/19 2100  remdesivir 100 mg in sodium chloride 0.9 % 100 mL IVPB     100 mg 200 mL/hr over 30 Minutes Intravenous Every 30 min 09/23/19 2054 09/23/19 2129   09/23/19 2025  remdesivir 200 mg in sodium chloride 0.9% 250 mL IVPB  Status:  Discontinued     200 mg 580 mL/hr over 30 Minutes Intravenous Once 09/23/19 2025 09/23/19 2054   09/23/19  1900  remdesivir 100 mg in sodium chloride 0.9 % 100 mL IVPB     100 mg 200 mL/hr over 30 Minutes Intravenous Every 30 min 09/23/19 1843 09/24/19 2103   09/23/19 1600  ceFEPIme (MAXIPIME) 2 g in sodium chloride 0.9 % 100 mL IVPB     2 g 200 mL/hr over 30 Minutes Intravenous  Once 09/23/19 1547 09/23/19 1658   09/23/19 1600  metroNIDAZOLE (FLAGYL) IVPB 500 mg     500 mg 100 mL/hr over 60 Minutes Intravenous  Once 09/23/19 1547 09/23/19 1825   09/23/19 1600  vancomycin (VANCOCIN) IVPB 1000 mg/200 mL premix     1,000 mg 200 mL/hr over 60 Minutes Intravenous  Once 09/23/19 1547 09/23/19 1926     Subjective  The patient is lying on her right side She is grunting  with respirations. She is tachypneic. She states that she is having pain with respirations.  Objective   Vitals:  Vitals:   09/25/19 1146 09/25/19 1423  BP:  101/65  Pulse:  91  Resp:  15  Temp:  99.8 F (37.7 C)  SpO2: 94% 95%   Exam:  Constitutional:  . The patient is awake, alert, and oriented x 3. Mild distress from dyspnea. Respiratory:  . Positive for increased work of breathing with tachynpea and accessory muscle use.  Marland Kitchen No wheezes, rales, or rhonchi . No tactile fremitus Cardiovascular:  . Regular rate and rhythm, tahycardic. Marland Kitchen No murmurs, ectopy, or gallups. . No lateral PMI. No thrills. Abdomen:  . Abdomen is soft, non-tender, non-distended . No hernias, masses, or organomegaly . Normoactive bowel sounds.  Musculoskeletal:  . No cyanosis, clubbing, or edema Skin:  . No rashes, lesions, ulcers . palpation of skin: no induration or nodules Neurologic:  . CN 2-12 intact . Sensation all 4 extremities intact Psychiatric:  . Mental status o Mood, affect appropriate o Orientation to person, place, time  . judgment and insight appear intact  I have personally reviewed the following:   Today's Data  . Vitals, BMP, CBC, Troponins, Ferritin, Procalcitonin  Micro Data  . Blood culture x 2: No growth  Imaging  . CTA chest  Cardiology Data  . EKG . Echocardiogram  Scheduled Meds: . vitamin C  500 mg Oral Daily  . dexamethasone (DECADRON) injection  6 mg Intravenous Q24H  . docusate sodium  100 mg Oral BID  . enoxaparin (LOVENOX) injection  40 mg Subcutaneous Q24H  . guaiFENesin  1,200 mg Oral BID  . insulin aspart  0-15 Units Subcutaneous TID WC  . Ipratropium-Albuterol  1 puff Inhalation Q6H  . melatonin  5 mg Oral QHS  . metoprolol tartrate  12.5 mg Oral BID  . pantoprazole  40 mg Oral Daily  . zinc sulfate  220 mg Oral Daily   Continuous Infusions: . azithromycin 500 mg (09/25/19 1510)  . cefTRIAXone (ROCEPHIN)  IV 1 g (09/25/19 1331)  .  remdesivir 100 mg in NS 100 mL 100 mg (09/25/19 1202)    Principal Problem:   Pneumonia due to COVID-19 virus Active Problems:   Pneumonia   Hypokalemia   Abnormal LFTs   Hyperbilirubinemia   Thrombocytopenia (HCC)   Hyperglycemia   Sepsis ([place])   Pneumonia involving right lung   Acute respiratory failure with hypoxia ([place])   COVID-19   COVID-19 virus infection   LOS: 2 days   A & P   VIRAL Sepsis 2/2 to COVID 19 Pneumonia with ? Superimposed Bacterial Right Lung PNA: The  patient was admitted to inpatient telemetry bed. She met sepsis criteria on admission due to fever, tachycardia, tachypnia due to pneumonia. There was pericardial effusion on echocardiogram without evidence of tamponade.  CXTA demonstrated no pulonary embolus. But did demonstrate mass-like infrahilar consolidate of the right lower lobe. Recommendation is to follow this with follow up CT of th chest in 6 weeks to exclude underlying mass. There is also some evidence of pulmonary hypertension. PCR for COVID was positive. She received vancomycin, cefepime, and Flagyl in the ED. This was changed to coverage for CAP with azithromycin and Rocephin on admission.   The patient has been started on remdesevir and steroids. Lactic acid was 1.6 and PCT was negative. UCx, Blood culture x 2, and UA were negative. Initial troponin was 39. Follow up troponin was 70. She will receive zinc and vitamin C. She is on a combivent inhaler as well as prn albuterol. Strep and Legionella antigen are pending. She is also on flutter valve, incentive spirometry, and guaifenesin.   Acute Respiratory Failure with Hypoxia in the setting of COVID 19-Pneumonia: Pt was hypoxic on presentation and initially was saturating in the low to mid nineties on room air. However she soon was found to be hypoxic. This has worsenned overnight. The patient is now requiring 3L O2 by [place] to maintain SaO2  In the low nineties. Repeat CXR demonstrates atelectasis. Prone  positioning is recommended as much as possible.   Chest pain: Likely pleuritic with elevated troponins due to demand ischemia. Cardiology also suggests that the patient may have percarditis or myocarditis related to her COVID-19 infection. They have recommended initiation of colchicine should pain continued. They see no need for invasive investigations. I appreciate Dr. Rosezella [place] help.  Hypokalemia: Improved to 3.8 from 3.3. Magnesium level is within normal limits. Monitor.  Abnormal LFTs: Elevation likely due to acute phase reactant in the setting of Viral Sepsis and COVID Infection. Monitor and trend liver function tests. Right upper quadrant ultrasound demonstrated only a fatty liver. CBD was normal.   Thrombocytopenia: Mild at 168 in the setting of COVID Infection as above. Monitor for S/Sx of Bleeding. Repeat CBC in AM.  Hyperglycemia: Likely reactive in the setting of COVID-19. This may be expected to worsen in the setting of Steroid Demargination. HbA1c is pending. Glucose will be followed by FSBS and covered with SSI. FSBS has been 107- 110 in the last 24 hours.  I have seen and examined this patient myself. I have spent 34 minutes in her evaluation and care.  DVT prophylaxis: Enoxaparin 40 mg sq q24h Code Status: FULL CODE  Family Communication: No family present at bedside  Disposition Plan: Admit to inpatient telemetry to the Covid unit  Jamarius Saha, DO [facility] Direct contact: see [email or web address]  7PM-7AM contact night coverage as above 09/25/2019, 4:37 PM  LOS: 1 day

## 2019-09-25 NOTE — TOC Progression Note (Signed)
Transition of Care [facility]) - Progression Note    Patient Details  Name: Brooke Casey MRN: [id number] Date of Birth: 09-Jul-1990  Transition of Care [facility]) CM/SW Contact  Geni Bers, RN Phone Number: 09/25/2019, 2:35 PM  Clinical Narrative:     Pt from home with spouse and children. Barrier for discharge is O2, HR and elevated Troponin. Plan to discharge home when stable.    Expected Discharge Plan: Home/Self Care Barriers to Discharge: No Barriers Identified  Expected Discharge Plan and Services Expected Discharge Plan: Home/Self Care       Living arrangements for the past 2 months: Single Family Home                                       Social Determinants of Health (SDOH) Interventions    Readmission Risk Interventions No flowsheet data found.

## 2019-09-25 NOTE — Progress Notes (Signed)
Progress Note  Patient Name: Brooke Casey Date of Encounter: 09/25/2019  Primary Cardiologist:   No primary care provider on file.   Subjective   She has SOB with any movement and pain with deep breathing or lying flat on her back.   Inpatient Medications    Scheduled Meds: . vitamin C  500 mg Oral Daily  . dexamethasone (DECADRON) injection  6 mg Intravenous Q24H  . docusate sodium  100 mg Oral BID  . enoxaparin (LOVENOX) injection  40 mg Subcutaneous Q24H  . guaiFENesin  1,200 mg Oral BID  . insulin aspart  0-15 Units Subcutaneous TID WC  . Ipratropium-Albuterol  1 puff Inhalation Q6H  . melatonin  5 mg Oral QHS  . metoprolol tartrate  12.5 mg Oral BID  . pantoprazole  40 mg Oral Daily  . zinc sulfate  220 mg Oral Daily   Continuous Infusions: . azithromycin Stopped (09/25/19 0000)  . cefTRIAXone (ROCEPHIN)  IV Stopped (09/25/19 0100)  . remdesivir 100 mg in NS 100 mL Stopped (09/24/19 2105)   PRN Meds: acetaminophen, albuterol, chlorpheniramine-HYDROcodone, guaiFENesin-dextromethorphan, menthol-cetylpyridinium, morphine injection, ondansetron **OR** ondansetron (ZOFRAN) IV, traMADol   Vital Signs    Vitals:   09/24/19 1355 09/24/19 1906 09/24/19 2107 09/25/19 0519  BP: 106/62 112/74 108/77 (!) 88/59  Pulse: (!) 122 (!) 121 (!) 115 91  Resp: (!) 22 19 (!) 21 20  Temp: (!) 102.7 F (39.3 C) (!) 101.9 F (38.8 C) 99.2 F (37.3 C) 97.8 F (36.6 C)  TempSrc: Oral Oral Oral Oral  SpO2: 99% 99% 98% 96%  Weight:      Height:        Intake/Output Summary (Last 24 hours) at 09/25/2019 0818 Last data filed at 09/25/2019 0500 Gross per 24 hour  Intake 2466.82 ml  Output 802 ml  Net 1664.82 ml   Filed Weights   09/23/19 1459  Weight: 68 kg    Telemetry    NSR, ST - Personally Reviewed  ECG    NSR, mild diffuse ST elevation. - Personally Reviewed  Physical Exam   GEN: No acute distress.   Neck: No  JVD Cardiac: RRR, no murmurs, rubs, or gallops.    Respiratory:   Decreased breath sounds bilaterally.  GI: Soft, nontender, non-distended  MS: No  edema; No deformity. Neuro:  Nonfocal  Psych: Normal affect   Labs    Chemistry Recent Labs  Lab 09/23/19 1549 09/23/19 1549 09/24/19 0426 09/24/19 1418 09/25/19 0102  NA 135   < > 134* 138 137  K 3.3*   < > 3.6 3.5 3.8  CL 101   < > 105 109 109  CO2 23   < > 21* 21* 21*  GLUCOSE 112*   < > 170* 145* 140*  BUN 9   < > 8 8 9   CREATININE 0.95   < > 0.71 0.73 0.65  CALCIUM 8.6*   < > 8.2* 7.9* 7.9*  PROT 8.4*  --  7.7  --  7.1  ALBUMIN 3.5  --  3.0*  --  2.6*  AST 77*  --  50*  --  50*  ALT 61*  --  57*  --  49*  ALKPHOS 54  --  53  --  47  BILITOT 1.3*  --  1.3*  --  0.5  GFRNONAA >60   < > >60 >60 >60  GFRAA >60   < > >60 >60 >60  ANIONGAP 11   < > 8  8 7   < > = values in this interval not displayed.     Hematology Recent Labs  Lab 09/24/19 0426 09/24/19 1418 09/25/19 0102  WBC 7.1 8.1 8.9  RBC 4.13 3.73* 3.60*  HGB 12.8 11.6* 10.9*  HCT 38.1 33.7* 32.4*  MCV 92.3 90.3 90.0  MCH 31.0 31.1 30.3  MCHC 33.6 34.4 33.6  RDW 12.5 12.4 12.7  PLT 176 186 168    Cardiac EnzymesNo results for input(s): TROPONINI in the last 168 hours. No results for input(s): TROPIPOC in the last 168 hours.   BNP Recent Labs  Lab 09/23/19 1616  BNP 42.4     DDimer  Recent Labs  Lab 09/24/19 0426 09/25/19 0102  DDIMER 2.74* 2.77*     Radiology    CT Angio Chest PE W and/or Wo Contrast  Result Date: 09/23/2019 CLINICAL DATA:  Shortness of breath EXAM: CT ANGIOGRAPHY CHEST WITH CONTRAST TECHNIQUE: Multidetector CT imaging of the chest was performed using the standard protocol during bolus administration of intravenous contrast. Multiplanar CT image reconstructions and MIPs were obtained to evaluate the vascular anatomy. CONTRAST:  OMNIPAQUE IOHEXOL 350 MG/ML SOLN COMPARISON:  None. FINDINGS: Cardiovascular: Satisfactory opacification of the pulmonary arteries to the  segmental level. No evidence of pulmonary embolism. Cardiomegaly. Enlargement of the main pulmonary artery up to 3.5 cm. No pericardial effusion. Mediastinum/Nodes: No enlarged mediastinal, hilar, or axillary lymph nodes. Thyroid gland, trachea, and esophagus demonstrate no significant findings. Lungs/Pleura: There is masslike infrahilar consolidation of the right lung (series 4, image 68). Small right, trace left pleural effusions and associated atelectasis or consolidation. Upper Abdomen: No acute abnormality. Musculoskeletal: No chest wall abnormality. No acute or significant osseous findings. Review of the MIP images confirms the above findings. IMPRESSION: 1.  Negative examination for pulmonary embolism. 2. There is masslike infrahilar consolidation of the right lung. Given patient age, this is very likely reflects infection, although recommend follow-up radiographs at 6 weeks to document complete resolution and exclude underlying mass. 3. Small right, trace left pleural effusions and associated atelectasis or consolidation. 4.  Cardiomegaly. 5. Enlargement of the main pulmonary artery up to 3.5 cm, as can be seen in pulmonary hypertension. Electronically Signed   By: Lauralyn Primes M.D.   On: 09/23/2019 17:20   DG CHEST PORT 1 VIEW  Result Date: 09/24/2019 CLINICAL DATA:  COVID-19 positive, hypoxia EXAM: PORTABLE CHEST 1 VIEW COMPARISON:  09/24/2019 at 5:45 a.m. FINDINGS: Single frontal view of the chest demonstrates persistent enlargement of the cardiac silhouette. The lung volumes are diminished, with continued bibasilar consolidation right greater than left. Trace right pleural effusion unchanged. No pneumothorax. Right hilar prominence consistent with soft tissue density on recent CT, possibly reflecting adenopathy. IMPRESSION: 1. Low lung volumes, with bibasilar consolidation most consistent with atelectasis. 2. Continued right hilar prominence, likely reflecting adenopathy. 3. Trace right pleural  effusion. Electronically Signed   By: Sharlet Salina M.D.   On: 09/24/2019 13:50   Portable chest 1 View  Result Date: 09/24/2019 CLINICAL DATA:  Shortness of breath EXAM: PORTABLE CHEST 1 VIEW COMPARISON:  September 23, 2019 FINDINGS: There is a small right pleural effusion. There is bibasilar airspace opacity. Lungs elsewhere are clear. Heart is enlarged with pulmonary vascularity normal. No adenopathy. No bone lesions. IMPRESSION: Bibasilar airspace opacity, likely due to pneumonia. Small right pleural effusion. No new opacity. There is cardiomegaly with pulmonary vascularity normal. No adenopathy appreciable. Electronically Signed   By: Bretta Bang III M.D.   On:  09/24/2019 08:21   DG Chest Port 1 View  Result Date: 09/23/2019 CLINICAL DATA:  Shortness of breath. Arise from urgent care having COVID symptoms. EXAM: PORTABLE CHEST 1 VIEW COMPARISON:  None FINDINGS: Cardiac enlargement with globular appearance of the heart. This finding is likely also accentuated by the low depth of expansion and portable technique. No sign of consolidation. No definite pleural effusion. Visualized skeletal structures are unremarkable. IMPRESSION: 1. Marked cardiac enlargement, correlate with any signs of heart failure or volume overload. With globular appearance the presence of pericardial effusion is not excluded. 2. Vascular congestion without edema. Limited assessment due to body habitus and portable technique. Electronically Signed   By: Donzetta Kohut M.D.   On: 09/23/2019 15:56   US Abdomen Limited RUQ  Result Date: 09/23/2019 CLINICAL DATA:  29 year old female with abdominal LFTs. EXAM: ULTRASOUND ABDOMEN LIMITED RIGHT UPPER QUADRANT COMPARISON:  None. FINDINGS: Gallbladder: No gallstones or wall thickening visualized. No sonographic Murphy sign noted by sonographer. Common bile duct: Diameter: 4 mm Liver: There is diffuse increased liver echogenicity most commonly seen in the setting of fatty infiltration.  Superimposed inflammation or fibrosis is not excluded. Clinical correlation is recommended. portal vein is patent on color Doppler imaging with normal direction of blood flow towards the liver. Other: None. IMPRESSION: Fatty liver, otherwise unremarkable right upper quadrant ultrasound. Electronically Signed   By: Elgie Collard M.D.   On: 09/23/2019 20:13    Cardiac Studies   ECHO:  Final pending.  No effusion .  EF OK.    Patient Profile     29 y.o. female without prior cardiac history.  Admitted with cough, muscle aches and chest pain.  Found to have COVID 19 and related pneumonia.    Assessment & Plan   CHEST PAIN:  Trop is elevated and continues to be but flat.  Negative CT earlier in admission for PE.  No effusion on this study or on bedside echo.  There is clearly a pleuritic component.  There could be pericardial or less likely myocardial involvement.  However, the therapy is still supportive with management as prescribed and close follow up with [place] after discharge.  No change in therapy.  If she continues to have chest pain I will likely start colchicine.    For questions or updates, please contact [facility] Please consult [email or web address] for contact info under Cardiology/STEMI.   Signed, Rollene Rotunda, MD  09/25/2019, 8:18 AM

## 2019-09-26 LAB — COMPREHENSIVE METABOLIC PANEL
ALT: 47 U/L — ABNORMAL HIGH (ref 0–44)
AST: 43 U/L — ABNORMAL HIGH (ref 15–41)
Albumin: 2.4 g/dL — ABNORMAL LOW (ref 3.5–5.0)
Alkaline Phosphatase: 44 U/L (ref 38–126)
Anion gap: 7 (ref 5–15)
BUN: 11 mg/dL (ref 6–20)
CO2: 23 mmol/L (ref 22–32)
Calcium: 8.1 mg/dL — ABNORMAL LOW (ref 8.9–10.3)
Chloride: 106 mmol/L (ref 98–111)
Creatinine, Ser: 0.69 mg/dL (ref 0.44–1.00)
GFR calc Af Amer: >60 mL/min (ref >60)
GFR calc non Af Amer: >60 mL/min (ref >60)
Glucose, Bld: 119 mg/dL — ABNORMAL HIGH (ref 70–99)
Potassium: 4 mmol/L (ref 3.5–5.1)
Sodium: 136 mmol/L (ref 135–145)
Total Bilirubin: 0.6 mg/dL (ref 0.3–1.2)
Total Protein: 6.8 g/dL (ref 6.5–8.1)

## 2019-09-26 LAB — MAGNESIUM: Magnesium: 2 mg/dL (ref 1.7–2.4)

## 2019-09-26 LAB — CBC WITH DIFFERENTIAL/PLATELET
Abs Immature Granulocytes: 0.16 10*3/uL — ABNORMAL HIGH (ref 0.00–0.07)
Basophils Absolute: 0 10*3/uL (ref 0.0–0.1)
Basophils Relative: 0 %
Eosinophils Absolute: 0 10*3/uL (ref 0.0–0.5)
Eosinophils Relative: 0 %
HCT: 34.2 % — ABNORMAL LOW (ref 36.0–46.0)
Hemoglobin: 11.6 g/dL — ABNORMAL LOW (ref 12.0–15.0)
Immature Granulocytes: 1 %
Lymphocytes Relative: 11 %
Lymphs Abs: 1.2 10*3/uL (ref 0.7–4.0)
MCH: 30.6 pg (ref 26.0–34.0)
MCHC: 33.9 g/dL (ref 30.0–36.0)
MCV: 90.2 fL (ref 80.0–100.0)
Monocytes Absolute: 0.2 10*3/uL (ref 0.1–1.0)
Monocytes Relative: 2 %
Neutro Abs: 9.5 10*3/uL — ABNORMAL HIGH (ref 1.7–7.7)
Neutrophils Relative %: 86 %
Platelets: 215 10*3/uL (ref 150–400)
RBC: 3.79 MIL/uL — ABNORMAL LOW (ref 3.87–5.11)
RDW: 13 % (ref 11.5–15.5)
WBC: 11 10*3/uL — ABNORMAL HIGH (ref 4.0–10.5)
nRBC: 0 % (ref 0.0–0.2)

## 2019-09-26 LAB — D-DIMER, QUANTITATIVE: D-Dimer, Quant: 2.31 ug/mL-FEU — ABNORMAL HIGH (ref 0.00–0.50)

## 2019-09-26 LAB — PROCALCITONIN: Procalcitonin: 1.11 ng/mL

## 2019-09-26 LAB — C-REACTIVE PROTEIN: CRP: 18.6 mg/dL — ABNORMAL HIGH (ref <1.0)

## 2019-09-26 LAB — PHOSPHORUS: Phosphorus: 3.1 mg/dL (ref 2.5–4.6)

## 2019-09-26 LAB — GLUCOSE, CAPILLARY
Glucose-Capillary: 95 mg/dL (ref 70–99)
Glucose-Capillary: 103 mg/dL — ABNORMAL HIGH (ref 70–99)
Glucose-Capillary: 122 mg/dL — ABNORMAL HIGH (ref 70–99)
Glucose-Capillary: 96 mg/dL (ref 70–99)

## 2019-09-26 LAB — FERRITIN: Ferritin: 344 ng/mL — ABNORMAL HIGH (ref 11–307)

## 2019-09-26 LAB — SEDIMENTATION RATE: Sed Rate: 88 mm/hr — ABNORMAL HIGH (ref 0–22)

## 2019-09-26 MED ORDER — AZITHROMYCIN 250 MG PO TABS
500.0000 mg | ORAL_TABLET | Freq: Every day | ORAL | Status: DC
  Administered 2019-09-26 – 2019-09-27 (×2): 500 mg via ORAL
  Filled 2019-09-26 (×2): qty 2

## 2019-09-26 NOTE — Progress Notes (Signed)
PROGRESS NOTE    Brooke Casey  BHA:[id number] DOB: 1990-08-04 DOA: 09/23/2019 PCP: Patient, No Pcp Per   Brief Narrative: 29 year old female with history of obesity, HSV and a genital infection presented with shortness of breath chills fever fatigue.  Symptoms started Friday went to urgent care and sent to ED where noted to be febrile 103.3, tested positive for COVID-19, chest x-ray and a CTA of the chest as well as a bedside echocardiogram. CTA was negative for pulmonary embolus. CXR demonstrated cardiomegaly and a right lower lobe consolidate.  On 09/24/2019 the patient developed chest pain and had an abrupt increase in troponin from 70 to more than 4000. MI was ruled out, and cardiology was consulted. Echocardiogram was obtained. It demonstrated EF of 50-55% with low normal function and no regional wall motion abnormalities. LV diastolic function could not be evaluated.  Cardiology feels that elevation of troponin was due to demand ischemia secondary to hypoxia and that chest pain was pleuritic and related to COVID pneumonia.  Subjective: Seen and examined this morning. On RA at 93% on bed- tachypneic 20-30s, unable to take deep breath has chest pain and shortness of breath with it. overall feeling much improved since admission No fever overnight  Assessment & Plan:  Acute hypoxic respiratory failure due to Covid pneumonia/viral sepsis due to Covid pneumonia, suspected  Superimposed bacterial right lung pneumonia: Patient was a started on remdesivir, steroid and will continued to complete the therapy.  Procalcitonin 1.87>1.11, inflammatory markers improving as below.  Patient is on ceftriaxone and azithromycin. COVID-19 Labs Recent Labs    09/23/19 1609 09/23/19 2205 09/24/19 0426 09/25/19 0102 09/26/19 0154  DDIMER  --   --  2.74* 2.77* 2.31*  FERRITIN  --   --  315* 335* 344*  LDH  --  193*  --   --   --   CRP   < >  --  33.7* 28.4* 18.6*   < > = values in this interval not  displayed.   Lab Results  Component Value Date   SARSCOV2NAA POSITIVE (A) 09/23/2019   Elevated troponin 70>>4025/chest pain suspecting demand mismatch, seen by cardiology patient may have pericarditis or myocarditis in the setting of Covid infection-add rise to initiate colchicine should pain continue no further inpatient recommendation.   Hypokalemia: Resolved  Abnormal LFTs/Hyperbilirubinemia: AST ALT in 40s relatively stable.  Thrombocytopenia : Resolved  Hyperglycemia. Check hba1c No results found for: HGBA1C  DVT prophylaxis:lovenox Code Status:full Family Communication: plan of care discussed with patient at bedside.  Patient self updating.    Status is: Inpatient Remains inpatient appropriate because:Inpatient level of care appropriate due to severity of illness Dispo: The patient is from: Home              Anticipated d/c is to: Home              Anticipated d/c date is: 2 days              Patient currently is not medically stable to d/c.  Diet Order            Diet Heart Room service appropriate? Yes; Fluid consistency: Thin  Diet effective now               Body mass index is 24.96 kg/m. Consultants:see note  Procedures:  Limited echocardiogram 4/27  Only limited images obtained; full doppler not performed; normal LV  function; no pericardial effusion.  2. Left ventricular ejection fraction, by estimation, is  50 to 55%. The  left ventricle has low normal function. The left ventricle has no regional  wall motion abnormalities. Left ventricular diastolic function could not  be evaluated.  3. Right ventricular systolic function is normal. The right ventricular  size is normal. There is normal pulmonary artery systolic pressure.  4. The mitral valve is normal in structure. Trivial mitral valve  regurgitation.  5. The aortic valve was not well visualized. Aortic valve regurgitation  is not visualized.   Microbiology:see note  Medications: Scheduled  Meds: . vitamin C  500 mg Oral Daily  . azithromycin  500 mg Oral Daily  . dexamethasone (DECADRON) injection  6 mg Intravenous Q24H  . docusate sodium  100 mg Oral BID  . enoxaparin (LOVENOX) injection  40 mg Subcutaneous Q24H  . guaiFENesin  1,200 mg Oral BID  . insulin aspart  0-15 Units Subcutaneous TID WC  . Ipratropium-Albuterol  1 puff Inhalation Q6H  . melatonin  5 mg Oral QHS  . metoprolol tartrate  12.5 mg Oral BID  . pantoprazole  40 mg Oral Daily  . zinc sulfate  220 mg Oral Daily   Continuous Infusions: . cefTRIAXone (ROCEPHIN)  IV 1 g (09/26/19 0907)  . remdesivir 100 mg in NS 100 mL 100 mg (09/26/19 0840)    Antimicrobials: Anti-infectives (From admission, onward)   Start     Dose/Rate Route Frequency Ordered Stop   09/26/19 1000  azithromycin (ZITHROMAX) tablet 500 mg     500 mg Oral Daily 09/26/19 0737     09/24/19 1800  cefTRIAXone (ROCEPHIN) 1 g in sodium chloride 0.9 % 100 mL IVPB     1 g 200 mL/hr over 30 Minutes Intravenous Daily 09/24/19 1739     09/24/19 1800  azithromycin (ZITHROMAX) 500 mg in sodium chloride 0.9 % 250 mL IVPB  Status:  Discontinued     500 mg 250 mL/hr over 60 Minutes Intravenous Daily 09/24/19 1739 09/26/19 0737   09/24/19 1000  remdesivir 100 mg in sodium chloride 0.9 % 100 mL IVPB  Status:  Discontinued     100 mg 200 mL/hr over 30 Minutes Intravenous Daily 09/23/19 2025 09/23/19 2054   09/24/19 1000  remdesivir 100 mg in sodium chloride 0.9 % 100 mL IVPB     100 mg 200 mL/hr over 30 Minutes Intravenous Daily 09/23/19 1847 09/28/19 0959   09/23/19 2100  remdesivir 100 mg in sodium chloride 0.9 % 100 mL IVPB     100 mg 200 mL/hr over 30 Minutes Intravenous Every 30 min 09/23/19 2054 09/23/19 2129   09/23/19 2025  remdesivir 200 mg in sodium chloride 0.9% 250 mL IVPB  Status:  Discontinued     200 mg 580 mL/hr over 30 Minutes Intravenous Once 09/23/19 2025 09/23/19 2054   09/23/19 1900  remdesivir 100 mg in sodium chloride 0.9 %  100 mL IVPB     100 mg 200 mL/hr over 30 Minutes Intravenous Every 30 min 09/23/19 1843 09/24/19 2103   09/23/19 1600  ceFEPIme (MAXIPIME) 2 g in sodium chloride 0.9 % 100 mL IVPB     2 g 200 mL/hr over 30 Minutes Intravenous  Once 09/23/19 1547 09/23/19 1658   09/23/19 1600  metroNIDAZOLE (FLAGYL) IVPB 500 mg     500 mg 100 mL/hr over 60 Minutes Intravenous  Once 09/23/19 1547 09/23/19 1825   09/23/19 1600  vancomycin (VANCOCIN) IVPB 1000 mg/200 mL premix     1,000 mg 200 mL/hr over 60 Minutes Intravenous  Once 09/23/19 1547 09/23/19 1926       Objective: Vitals: Today's Vitals   09/25/19 2006 09/25/19 2101 09/25/19 2105 09/26/19 0547  BP:  103/70 107/78 (!) 99/56  Pulse:  (!) 111 (!) 109 77  Resp:  20 20 18   Temp:  98.8 F (37.1 C)  97.7 F (36.5 C)  TempSrc:  Oral  Oral  SpO2:  98% 97% 95%  Weight:      Height:      PainSc: 0-No pain       Intake/Output Summary (Last 24 hours) at 09/26/2019 1056 Last data filed at 09/25/2019 2006 Gross per 24 hour  Intake 449.84 ml  Output --  Net 449.84 ml   Filed Weights   09/23/19 1459  Weight: 68 kg   Weight change:    Intake/Output from previous day: 04/27 0701 - 04/28 0700 In: 449.8 [IV Piggyback:449.8] Out: -  Intake/Output this shift: No intake/output data recorded.  Examination:  General exam: AAO x3, AAF,NAD, weak appearing. HEENT:Oral mucosa moist, Ear/Nose WNL grossly,dentition normal. Respiratory system: bilaterally diminished BS, tachypneic,no use of accessory muscle, non tender. Cardiovascular system: S1 & S2 +, regular, No JVD. Gastrointestinal system: Abdomen soft, NT,ND, BS+. Nervous System:Alert, awake, moving extremities and grossly nonfocal Extremities: No edema, distal peripheral pulses palpable.  Skin: No rashes,no icterus. MSK: Normal muscle bulk,tone, power  Data Reviewed: I have personally reviewed following labs and imaging studies CBC: Recent Labs  Lab 09/23/19 1549 09/24/19 0426  09/24/19 1418 09/25/19 0102 09/26/19 0154  WBC 5.5 7.1 8.1 8.9 11.0*  NEUTROABS 4.6 6.5 7.3 7.7 9.5*  HGB 15.0 12.8 11.6* 10.9* 11.6*  HCT 42.7 38.1 33.7* 32.4* 34.2*  MCV 89.5 92.3 90.3 90.0 90.2  PLT 149* 176 186 168 215   Basic Metabolic Panel: Recent Labs  Lab 09/23/19 1549 09/24/19 0426 09/24/19 1418 09/25/19 0102 09/26/19 0154  NA 135 134* 138 137 136  K 3.3* 3.6 3.5 3.8 4.0  CL 101 105 109 109 106  CO2 23 21* 21* 21* 23  GLUCOSE 112* 170* 145* 140* 119*  BUN 9 8 8 9 11   CREATININE 0.95 0.71 0.73 0.65 0.69  CALCIUM 8.6* 8.2* 7.9* 7.9* 8.1*  MG  --  2.1  --  2.0 2.0  PHOS  --  2.0*  --  2.4* 3.1   GFR: Estimated Creatinine Clearance: 95 mL/min (by C-G formula based on SCr of 0.69 mg/dL). Liver Function Tests: Recent Labs  Lab 09/23/19 1549 09/24/19 0426 09/25/19 0102 09/26/19 0154  AST 77* 50* 50* 43*  ALT 61* 57* 49* 47*  ALKPHOS 54 53 47 44  BILITOT 1.3* 1.3* 0.5 0.6  PROT 8.4* 7.7 7.1 6.8  ALBUMIN 3.5 3.0* 2.6* 2.4*   No results for input(s): LIPASE, AMYLASE in the last 168 hours. No results for input(s): AMMONIA in the last 168 hours. Coagulation Profile: Recent Labs  Lab 09/23/19 1549 09/24/19 1829  INR 1.3* 1.3*   Cardiac Enzymes: Recent Labs  Lab 09/23/19 1610  CKTOTAL 208  CKMB 0.5   BNP (last 3 results) No results for input(s): PROBNP in the last 8760 hours. HbA1C: No results for input(s): HGBA1C in the last 72 hours. CBG: Recent Labs  Lab 09/25/19 0736 09/25/19 1140 09/25/19 1644 09/25/19 2056 09/26/19 0818  GLUCAP 110* 108* 117* 104* 95   Lipid Profile: No results for input(s): CHOL, HDL, LDLCALC, TRIG, CHOLHDL, LDLDIRECT in the last 72 hours. Thyroid Function Tests: No results for input(s): TSH, T4TOTAL, FREET4, T3FREE, THYROIDAB  in the last 72 hours. Anemia Panel: Recent Labs    09/25/19 0102 09/26/19 0154  FERRITIN 335* 344*   Sepsis Labs: Recent Labs  Lab 09/23/19 1549 09/24/19 1418 09/24/19 1604  09/24/19 1829 09/25/19 0102 09/26/19 0154  PROCALCITON  --   --   --  2.29 1.87 1.11  LATICACIDVEN 1.6 2.6* 1.4 1.6  --   --     Recent Results (from the past 240 hour(s))  Blood Culture (routine x 2)     Status: None (Preliminary result)   Collection Time: 09/23/19  3:13 PM   Specimen: BLOOD LEFT FOREARM  Result Value Ref Range Status   Specimen Description   Final    BLOOD LEFT FOREARM Performed at [facility], [address], [place], [place] [address]    Special Requests   Final    BOTTLES DRAWN AEROBIC AND ANAEROBIC Blood Culture adequate volume Performed at [facility], [address] [place], [place] [address]    Culture   Final    NO GROWTH 3 DAYS Performed at [facility], [address] [place], [place] [address]    Report Status PENDING  Incomplete  Respiratory Panel by RT PCR (Flu A&B, Covid) - Nasopharyngeal Swab     Status: Abnormal   Collection Time: 09/23/19  4:48 PM   Specimen: Nasopharyngeal Swab  Result Value Ref Range Status   SARS Coronavirus 2 by RT PCR POSITIVE (A) NEGATIVE Final    Comment: RESULT CALLED TO, READ BACK BY AND VERIFIED WITH: DR.SHEK,MD [id number] BY V.WILKINS (NOTE) SARS-CoV-2 target nucleic acids are DETECTED. SARS-CoV-2 RNA is generally detectable in upper respiratory specimens  during the acute phase of infection. Positive results are indicative of the presence of the identified virus, but do not rule out bacterial infection or co-infection with other pathogens not detected by the test. Clinical correlation with patient history and other diagnostic information is necessary to determine patient infection status. The expected result is Negative. Fact Sheet for Patients:  https://www.moore.com/ Fact Sheet for Healthcare Providers: https://www.young.biz/ This test is not yet approved or cleared by the [place] FDA and  has been authorized for  detection and/or diagnosis of SARS-CoV-2 by FDA under an Emergency Use Authorization (EUA).  This EUA will remain in effect (meaning this test can be used) f or the duration of  the COVID-19 declaration under Section 564(b)(1) of the Act, 21 U.S.C. section 360bbb-3(b)(1), unless the authorization is terminated or revoked sooner.    Influenza A by PCR NEGATIVE NEGATIVE Final   Influenza B by PCR NEGATIVE NEGATIVE Final    Comment: (NOTE) The Xpert Xpress SARS-CoV-2/FLU/RSV assay is intended as an aid in  the diagnosis of influenza from Nasopharyngeal swab specimens and  should not be used as a sole basis for treatment. Nasal washings and  aspirates are unacceptable for Xpert Xpress SARS-CoV-2/FLU/RSV  testing. Fact Sheet for Patients: https://www.moore.com/ Fact Sheet for Healthcare Providers: https://www.young.biz/ This test is not yet approved or cleared by the [place] FDA and  has been authorized for detection and/or diagnosis of SARS-CoV-2 by  FDA under an Emergency Use Authorization (EUA). This EUA will remain  in effect (meaning this test can be used) for the duration of the  Covid-19 declaration under Section 564(b)(1) of the Act, 21  U.S.C. section 360bbb-3(b)(1), unless the authorization is  terminated or revoked. Performed at [facility], [address] [place], [place] [address]   Urine culture  Status: None   Collection Time: 09/23/19  5:27 PM   Specimen: In/Out Cath Urine  Result Value Ref Range Status   Specimen Description   Final    IN/OUT CATH URINE Performed at [facility], [address], [place], [place] [address]    Special Requests   Final    NONE Performed at [facility], [address], [place], [place] [address]    Culture   Final    NO GROWTH Performed at [facility], [address] [place], [place] [address]    Report Status  09/24/2019 FINAL  Final  Blood Culture (routine x 2)     Status: None (Preliminary result)   Collection Time: 09/23/19 10:05 PM   Specimen: BLOOD  Result Value Ref Range Status   Specimen Description   Final    BLOOD RIGHT ANTECUBITAL Performed at [facility], [address], [place], [place] [address]    Special Requests   Final    BOTTLES DRAWN AEROBIC ONLY Blood Culture adequate volume Performed at [facility], [address] [place], [place] [address]    Culture   Final    NO GROWTH 2 DAYS Performed at [facility], [address], [place], [place] [address]    Report Status PENDING  Incomplete  Culture, blood (x 2)     Status: None (Preliminary result)   Collection Time: 09/24/19  6:29 PM   Specimen: BLOOD  Result Value Ref Range Status   Specimen Description   Final    BLOOD RIGHT ANTECUBITAL Performed at [facility], [address], [place], [place] [address]    Special Requests   Final    BOTTLES DRAWN AEROBIC ONLY Blood Culture adequate volume Performed at [facility], [address] [place], [place] [address]    Culture   Final    NO GROWTH 2 DAYS Performed at [facility], [address] [place], [place] [address]    Report Status PENDING  Incomplete  Culture, blood (x 2)     Status: None (Preliminary result)   Collection Time: 09/24/19  6:29 PM   Specimen: BLOOD RIGHT HAND  Result Value Ref Range Status   Specimen Description   Final    BLOOD RIGHT HAND Performed at [facility], [address], [place], [place] [address]    Special Requests   Final    BOTTLES DRAWN AEROBIC ONLY Blood Culture adequate volume Performed at [facility], [address], [place], [place] [address]    Culture   Final    NO GROWTH 2 DAYS Performed at [facility], [address] [place], [place] [address]    Report Status PENDING   Incomplete      Radiology Studies: DG CHEST PORT 1 VIEW  Result Date: 09/24/2019 CLINICAL DATA:  COVID-19 positive, hypoxia EXAM: PORTABLE CHEST 1 VIEW COMPARISON:  09/24/2019 at 5:45 a.m. FINDINGS: Single frontal view of the chest demonstrates persistent enlargement of the cardiac silhouette. The lung volumes are diminished, with continued bibasilar consolidation right greater than left. Trace right pleural effusion unchanged. No pneumothorax. Right hilar prominence consistent with soft tissue density on recent CT, possibly reflecting adenopathy. IMPRESSION: 1. Low lung volumes, with bibasilar consolidation most consistent with atelectasis. 2. Continued right hilar prominence, likely reflecting adenopathy. 3. Trace right pleural effusion. Electronically Signed   By: Maxwell Caul.D.  On: 09/24/2019 13:50   ECHOCARDIOGRAM LIMITED  Result Date: 09/25/2019    ECHOCARDIOGRAM LIMITED REPORT   Patient Name:   Brooke Casey Date of Exam: 09/25/2019 Medical Rec #:  [id number]     Height:       65.0 in Accession #:    [id number]    Weight:       150.0 lb Date of Birth:  [birth year]     BSA:          1.750 m Patient Age:    28 years      BP:           88/51 mmHg Patient Gender: F             HR:           91 bpm. Exam Location:  Inpatient Procedure: Limited Echo, Cardiac Doppler and Color Doppler Indications:    Abnormal EKG  History:        Patient has no prior history of Echocardiogram examinations.                 Signs/Symptoms:Shortness of Breath; Risk Factors:Morbid obesity.                 Covid positive, Pneumonia.  Sonographer:    Lavenia Atlas Referring Phys: [phone] AVA SWAYZE IMPRESSIONS  1. Only limited images obtained; full doppler not performed; normal LV function; no pericardial effusion.  2. Left ventricular ejection fraction, by estimation, is 50 to 55%. The left ventricle has low normal function. The left ventricle has no regional wall motion abnormalities. Left ventricular diastolic function  could not be evaluated.  3. Right ventricular systolic function is normal. The right ventricular size is normal. There is normal pulmonary artery systolic pressure.  4. The mitral valve is normal in structure. Trivial mitral valve regurgitation.  5. The aortic valve was not well visualized. Aortic valve regurgitation is not visualized. FINDINGS  Left Ventricle: Left ventricular ejection fraction, by estimation, is 50 to 55%. The left ventricle has low normal function. The left ventricle has no regional wall motion abnormalities. The left ventricular internal cavity size was small. There is no left ventricular hypertrophy. Right Ventricle: The right ventricular size is normal. Right ventricular systolic function is normal. There is normal pulmonary artery systolic pressure. The tricuspid regurgitant velocity is 2.19 m/s, and with an assumed right atrial pressure of 3 mmHg,  the estimated right ventricular systolic pressure is 22.2 mmHg. Left Atrium: Left atrial size was normal in size. Right Atrium: Right atrial size was normal in size. Pericardium: There is no evidence of pericardial effusion. Mitral Valve: The mitral valve is normal in structure. Trivial mitral valve regurgitation. Tricuspid Valve: The tricuspid valve is normal in structure. Tricuspid valve regurgitation is mild. Aortic Valve: The aortic valve was not well visualized. Aortic valve regurgitation is not visualized. Pulmonic Valve: The pulmonic valve was not well visualized. Venous: The inferior vena cava was not well visualized. Additional Comments: Only limited images obtained; full doppler not performed; normal LV function; no pericardial effusion.  LEFT VENTRICLE PLAX 2D LVIDd:         3.70 cm Diastology LVIDs:         2.90 cm LV e' lateral: 7.18 cm/s LV PW:         1.10 cm LV e' medial:  6.74 cm/s LV IVS:        1.20 cm  LEFT ATRIUM         Index LA diam:  3.20 cm 1.83 cm/m   AORTA Ao Root diam: 2.90 cm TRICUSPID VALVE TR Peak grad:   19.2  mmHg TR Vmax:        219.00 cm/s Olga Millers MD Electronically signed by Olga Millers MD Signature Date/Time: 09/25/2019/11:41:51 AM    Final      LOS: 3 days   Time spent: More than 50% of that time was spent in counseling and/or coordination of care.  Lanae Boast, MD [facility]  09/26/2019, 10:56 AM

## 2019-09-26 NOTE — Progress Notes (Signed)
CCMD called pt ringing out apnea on monitor, O2 sats in the mid 90's, checked on PT she is sleeping prone.  In no distress

## 2019-09-26 NOTE — Progress Notes (Signed)
Pharmacy IV to PO conversion  This patient is receiving azithromycin by the intravenous route. Based on criteria approved by the Pharmacy and Therapeutics Committee, and the Infectious Disease Division, the antibiotic(s) is/are being converted to equivalent oral dose form(s). These criteria include:   Patient being treated for a respiratory tract infection, urinary tract infection, cellulitis, or Clostridium Difficile Associated Diarrhea  The patient is not neutropenic and does not exhibit a GI malabsorption state  The patient is eating (either orally or per tube) and/or has been taking other orally administered medications for at least 24 hours.  The patient is improving clinically (physician assessment and a 24-hour Tmax of <=100.5 F)  If you have any questions about this conversion, please contact the Pharmacy Department (ext [phone]).  Thank you.  Bernadene Person, PharmD, BCPS [phone] 09/26/2019, 7:38 AM

## 2019-09-26 NOTE — Plan of Care (Signed)
  Problem: Education: Goal: Knowledge of risk factors and measures for prevention of condition will improve Outcome: Progressing   Problem: Coping: Goal: Psychosocial and spiritual needs will be supported Outcome: Progressing   Problem: Respiratory: Goal: Will maintain a patent airway Outcome: Progressing Pt continues to be on room air, in no distress, able to talk in complete sentences Goal: Complications related to the disease process, condition or treatment will be avoided or minimized Outcome: Progressing

## 2019-09-27 DIAGNOSIS — U071 COVID-19: Secondary | ICD-10-CM | POA: Diagnosis not present

## 2019-09-27 DIAGNOSIS — J1282 Pneumonia due to Coronavirus disease 2019: Secondary | ICD-10-CM | POA: Diagnosis not present

## 2019-09-27 LAB — CBC WITH DIFFERENTIAL/PLATELET
Abs Immature Granulocytes: 0.29 10*3/uL — ABNORMAL HIGH (ref 0.00–0.07)
Basophils Absolute: 0.1 10*3/uL (ref 0.0–0.1)
Basophils Relative: 0 %
Eosinophils Absolute: 0.1 10*3/uL (ref 0.0–0.5)
Eosinophils Relative: 0 %
HCT: 38.1 % (ref 36.0–46.0)
Hemoglobin: 13 g/dL (ref 12.0–15.0)
Immature Granulocytes: 2 %
Lymphocytes Relative: 14 %
Lymphs Abs: 2.2 10*3/uL (ref 0.7–4.0)
MCH: 30.7 pg (ref 26.0–34.0)
MCHC: 34.1 g/dL (ref 30.0–36.0)
MCV: 89.9 fL (ref 80.0–100.0)
Monocytes Absolute: 0.2 10*3/uL (ref 0.1–1.0)
Monocytes Relative: 1 %
Neutro Abs: 12.3 10*3/uL — ABNORMAL HIGH (ref 1.7–7.7)
Neutrophils Relative %: 83 %
Platelets: 314 10*3/uL (ref 150–400)
RBC: 4.24 MIL/uL (ref 3.87–5.11)
RDW: 13.2 % (ref 11.5–15.5)
WBC: 15.1 10*3/uL — ABNORMAL HIGH (ref 4.0–10.5)
nRBC: 0.2 % (ref 0.0–0.2)

## 2019-09-27 LAB — GLUCOSE, CAPILLARY
Glucose-Capillary: 100 mg/dL — ABNORMAL HIGH (ref 70–99)
Glucose-Capillary: 109 mg/dL — ABNORMAL HIGH (ref 70–99)
Glucose-Capillary: 110 mg/dL — ABNORMAL HIGH (ref 70–99)

## 2019-09-27 LAB — COMPREHENSIVE METABOLIC PANEL
ALT: 53 U/L — ABNORMAL HIGH (ref 0–44)
AST: 33 U/L (ref 15–41)
Albumin: 2.5 g/dL — ABNORMAL LOW (ref 3.5–5.0)
Alkaline Phosphatase: 48 U/L (ref 38–126)
Anion gap: 9 (ref 5–15)
BUN: 15 mg/dL (ref 6–20)
CO2: 22 mmol/L (ref 22–32)
Calcium: 8 mg/dL — ABNORMAL LOW (ref 8.9–10.3)
Chloride: 106 mmol/L (ref 98–111)
Creatinine, Ser: 0.74 mg/dL (ref 0.44–1.00)
GFR calc Af Amer: >60 mL/min (ref >60)
GFR calc non Af Amer: >60 mL/min (ref >60)
Glucose, Bld: 117 mg/dL — ABNORMAL HIGH (ref 70–99)
Potassium: 3.6 mmol/L (ref 3.5–5.1)
Sodium: 137 mmol/L (ref 135–145)
Total Bilirubin: 0.3 mg/dL (ref 0.3–1.2)
Total Protein: 6.8 g/dL (ref 6.5–8.1)

## 2019-09-27 LAB — HEMOGLOBIN A1C
Hgb A1c MFr Bld: 5.7 % — ABNORMAL HIGH (ref 4.8–5.6)
Mean Plasma Glucose: 116.89 mg/dL

## 2019-09-27 LAB — PROCALCITONIN: Procalcitonin: 0.51 ng/mL

## 2019-09-27 LAB — D-DIMER, QUANTITATIVE: D-Dimer, Quant: 1.81 ug/mL-FEU — ABNORMAL HIGH (ref 0.00–0.50)

## 2019-09-27 LAB — SEDIMENTATION RATE: Sed Rate: 77 mm/hr — ABNORMAL HIGH (ref 0–22)

## 2019-09-27 LAB — C-REACTIVE PROTEIN: CRP: 11 mg/dL — ABNORMAL HIGH (ref <1.0)

## 2019-09-27 LAB — FERRITIN: Ferritin: 323 ng/mL — ABNORMAL HIGH (ref 11–307)

## 2019-09-27 LAB — PHOSPHORUS: Phosphorus: 3.8 mg/dL (ref 2.5–4.6)

## 2019-09-27 LAB — MAGNESIUM: Magnesium: 2.1 mg/dL (ref 1.7–2.4)

## 2019-09-27 MED ORDER — ASCORBIC ACID 500 MG PO TABS: 500.0000 mg | ORAL_TABLET | Freq: Every day | ORAL | 0 refills | Status: AC

## 2019-09-27 MED ORDER — ZINC SULFATE 220 (50 ZN) MG PO CAPS: 220.0000 mg | ORAL_CAPSULE | Freq: Every day | ORAL | 0 refills | Status: AC

## 2019-09-27 MED ORDER — DEXAMETHASONE 6 MG PO TABS
6.0000 mg | ORAL_TABLET | Freq: Every day | ORAL | 0 refills | Status: AC
Start: 2019-09-27 — End: 2019-10-02

## 2019-09-27 MED ORDER — AMOXICILLIN-POT CLAVULANATE 875-125 MG PO TABS
1.0000 | ORAL_TABLET | Freq: Two times a day (BID) | ORAL | 0 refills | Status: AC
Start: 2019-09-27 — End: 2019-09-30

## 2019-09-27 MED ORDER — GUAIFENESIN-DM 100-10 MG/5ML PO SYRP: 10.0000 mL | ORAL_SOLUTION | ORAL | 0 refills | Status: DC | PRN

## 2019-09-27 NOTE — Evaluation (Signed)
Physical Therapy Evaluation Patient Details Name: Brooke Casey MRN: [id number] DOB: 06-20-1990 Today's Date: 09/27/2019   History of Present Illness  29 yo admittred with diarrhea, SOB, Positive for covid  Clinical Impression  Patient initially unsteady when ambulating in hall. Gradual improvement. Provided instruction/teach back HEP for U/LE's, reviewed IS and flutter devices. Patient ambulated on RA with SPO2 > 91%, resting >/=95%. RN notified  That evaluation completed. Plans are for Dc.    Follow Up Recommendations  none    Equipment Recommendations    none   Recommendations for Other Services       Precautions / Restrictions Precautions Precautions: None      Mobility  Bed Mobility Overal bed mobility: Independent                Transfers Overall transfer level: Independent                  Ambulation/Gait Ambulation/Gait assistance: Min guard;Min assist Gait Distance (Feet): 400 Feet Assistive device: None Gait Pattern/deviations: Step-through pattern;Drifts right/left Gait velocity: decr Gait velocity interpretation: <1.31 ft/sec, indicative of household ambulator General Gait Details: Initially unsteady and wobbly, improved with distance. SPO2 91% on RA ambulating  Stairs            Wheelchair Mobility    Modified Rankin (Stroke Patients Only)       Balance                                             Pertinent Vitals/Pain Pain Assessment: No/denies pain    Home Living Family/patient expects to be discharged to:: Private residence Living Arrangements: Spouse/significant other;Children Available Help at Discharge: Family Type of Home: House Home Access: Level entry     Home Layout: One level Home Equipment: None      Prior Function Level of Independence: Independent               Hand Dominance        Extremity/Trunk Assessment   Upper Extremity Assessment Upper Extremity Assessment:  Generalized weakness    Lower Extremity Assessment Lower Extremity Assessment: Generalized weakness    Cervical / Trunk Assessment Cervical / Trunk Assessment: Normal  Communication   Communication: No difficulties  Cognition Arousal/Alertness: Awake/alert Behavior During Therapy: WFL for tasks assessed/performed Overall Cognitive Status: Within Functional Limits for tasks assessed                                        General Comments      Exercises Other Exercises Other Exercises: HE for TB for UE amd LE standing progrtam performed x 5 reps each   Assessment/Plan    PT Assessment Patient needs continued PT services;Patent does not need any further PT services  PT Problem List Decreased strength;Decreased activity tolerance;Decreased knowledge of use of DME;Decreased mobility       PT Treatment Interventions Gait training;Therapeutic activities;Patient/family education;Therapeutic exercise    PT Goals (Current goals can be found in the Care Plan section)  Acute Rehab PT Goals Patient Stated Goal: to go home and back to work PT Goal Formulation: All assessment and education complete, DC therapy    Frequency     Barriers to discharge        Co-evaluation  AM-PAC PT "6 Clicks" Mobility  Outcome Measure Help needed turning from your back to your side while in a flat bed without using bedrails?: None Help needed moving from lying on your back to sitting on the side of a flat bed without using bedrails?: None Help needed moving to and from a bed to a chair (including a wheelchair)?: None Help needed standing up from a chair using your arms (e.g., wheelchair or bedside chair)?: None Help needed to walk in hospital room?: A Little Help needed climbing 3-5 steps with a railing? : A Little 6 Click Score: 22    End of Session   Activity Tolerance: Patient tolerated treatment well Patient left: in bed;with call bell/phone within  reach Nurse Communication: Mobility status PT Visit Diagnosis: Muscle weakness (generalized) (M62.81);Difficulty in walking, not elsewhere classified (R26.2)    Time: 4356-8616 PT Time Calculation (min) (ACUTE ONLY): 30 min   Charges:   PT Evaluation $PT Eval Low Complexity: 1 Low PT Treatments $Gait Training: 8-22 mins        Tresa Endo PT Acute Rehabilitation Services Pager [phone] Office [phone]   Brooke Casey 09/27/2019, 3:23 PM

## 2019-09-27 NOTE — Discharge Summary (Signed)
Physician Discharge Summary  Brooke Casey JIR:[id number] DOB: 05-Sep-1990 DOA: 09/23/2019  PCP: Patient, No Pcp Per  Admit date: 09/23/2019 Discharge date: 09/27/2019  Admitted From: home Disposition:  home  Recommendations for Outpatient Follow-up:  1. Follow up with PCP in 1-2 weeks 2. Please obtain BMP/CBC in one week 3. Please follow up on the following pending results:  Home Health:No  Equipment/Devices: None  Discharge Condition: Stable Code Status: FULL Diet recommendation: Heart Healthy  Brief/Interim Summary:  Brief Narrative: 29 year old female with history of obesity, HSV and a genital infection presented with shortness of breath chills fever fatigue.  Symptoms started Friday went to urgent care and sent to ED where noted to be febrile 103.3, tested positive for COVID-19, chest x-ray and a CTA of the chest as well as a bedside echocardiogram. CTA was negative for pulmonary embolus. CXR demonstrated cardiomegaly and a right lower lobe consolidate.  On 09/24/2019 the patient developed chest pain and had an abrupt increase in troponin from 70 to more than 4000. MI was ruled out, and cardiology was consulted. Echocardiogram was obtained. It demonstrated EF of 50-55% with low normal function and no regional wall motion abnormalities. LV diastolic function could not be evaluated.  Cardiology feels that elevation of troponin was due to demand ischemia secondary to hypoxia and that chest pain was pleuritic and related to COVID pneumonia. Patient was admitted and treated with remdesivir and Decadron.  She has improved nicely. At this time she is no longer needing oxygen able to take deep breath no shortness of breath cough fever chills. Completed remdesivir 5 doses 4/29.  Discharge Diagnoses:  Assessment & Plan:  Acute hypoxic respiratory failure due to Covid pneumonia/viral sepsis due to Covid pneumonia, suspected  Superimposed bacterial right lung pneumonia: Patient was a started  on remdesivir, steroid and will continued to complete the therapy.  Procalcitonin 1.87>1.11, inflammatory markers improving and at this time no shortness of breath, not needing oxygen.   She was also treated with  ceftriaxone and azithromycin.  On discharge we will continue Decadron to complete the course and few more days of oral antibiotics. She will follow up ith PCP and continue on home isolation as per guidelines  She ambulated in hallway w/o need for o2 and did well. COVID-19 Labs Recent Labs    09/25/19 0102 09/26/19 0154 09/27/19 0718  DDIMER 2.77* 2.31* 1.81*  FERRITIN 335* 344* 323*  CRP 28.4* 18.6* 11.0*   Lab Results  Component Value Date   SARSCOV2NAA POSITIVE (A) 09/23/2019   Elevated troponin 70>>4025/chest pain suspecting demand mismatch, seen by cardiology , no PE on CT AAA no effusion on study or on bedside echo, pleuritic component could be pericardial or less likely myocardial involvement.  Management as per cardio supportive measures and close follow-up with cardiology upon discharge.   I will given Dr Antoine Poche office no for her to call to for appointment.  Leukocytosis secondary to steroid use.Procalcitonin is significantly improving 2.29 on admission and today at 0.5  Hypokalemia: Resolved  Abnormal LFTs/Hyperbilirubinemia: AST ALT in 40s relatively stable.  Thrombocytopenia : Resolved  Hyperglycemia. Checked hba1c borderline at 5.7.  Will need to follow-up closely with PCP, will need weight loss and low-carb diet  DVT prophylaxis:lovenox Code Status:full Family Communication: plan of care discussed with patient at bedside.  Patient self updating.     Diet Order            Diet Heart Room service appropriate? Yes; Fluid consistency: Thin  Diet effective now  Body mass index is 24.96 kg/m. Consultants:see note  Procedures:  Limited echocardiogram 4/27  Only limited images obtained; full doppler not performed; normal LV  function; no  pericardial effusion.  2. Left ventricular ejection fraction, by estimation, is 50 to 55%. The  left ventricle has low normal function. The left ventricle has no regional  wall motion abnormalities. Left ventricular diastolic function could not  be evaluated.  3. Right ventricular systolic function is normal. The right ventricular  size is normal. There is normal pulmonary artery systolic pressure.  4. The mitral valve is normal in structure. Trivial mitral valve  regurgitation.  5. The aortic valve was not well visualized. Aortic valve regurgitation  is not visualized.   Microbiology:see note  Consults:  cardiology   Subjective:  Doing well no new complaints.  On room air.    Discharge Exam: Vitals:   09/27/19 0543 09/27/19 1115  BP: 99/70 109/69  Pulse: 73 86  Resp: 20 (!) 22  Temp: 97.9 F (36.6 C) 98.2 F (36.8 C)  SpO2: 95% 92%   General: Pt is alert, awake, not in acute distress Cardiovascular: RRR, S1/S2 +, no rubs, no gallops Respiratory: CTA bilaterally, no wheezing, no rhonchi Abdominal: Soft, NT, ND, bowel sounds + Extremities: no edema, no cyanosis  Discharge Instructions  Discharge Instructions    Diet - low sodium heart healthy   Complete by: As directed    Discharge instructions   Complete by: As directed    Please call call MD or return to ER for similar or worsening recurring problem that brought you to hospital or if any fever,nausea/vomiting,abdominal pain, uncontrolled pain, chest pain,  shortness of breath or any other alarming symptoms.  Please follow-up your doctor as instructed in a week time and call the office for appointment.  Please avoid alcohol, smoking, or any other illicit substance and maintain healthy habits including taking your regular medications as prescribed.  You were cared for by a hospitalist during your hospital stay. If you have any questions about your discharge medications or the care you received while you were in  the hospital after you are discharged, you can call the unit and ask to speak with the hospitalist on call if the hospitalist that took care of you is not available.  Once you are discharged, your primary care physician will handle any further medical issues. Please note that NO REFILLS for any discharge medications will be authorized once you are discharged, as it is imperative that you return to your primary care physician (or establish a relationship with a primary care physician if you do not have one) for your aftercare needs so that they can reassess your need for medications and monitor your lab values  COVID DISCHARGE INSTRUCTION:  You may not return to work/leave the home until at least 21 days since symptom onset AND 3 days without a fever (without taking tylenol, ibuprofen, etc.) and without a cough.  Please return to work only after you have been cleared to do so by your primary care physician.  Directions for you at home:  Wear a facemask - For the next 2 weeks  You should wear a facemask that covers your nose and mouth when you are in the same room with other people and when you visit a healthcare provider. People who live with or visit you should also wear a facemask while they are in the same room with you.  Separate yourself from other people in your home As much  as possible, you should stay in a different room from other people in your home. Also, you should use a separate bathroom, if available.  Avoid sharing household items You should not share dishes, drinking glasses, cups, eating utensils, towels, bedding, or other items with other people in your home. After using these items, you should wash them thoroughly with soap and water.  Cover your coughs and sneezes Cover your mouth and nose with a tissue when you cough or sneeze, or you can cough or sneeze into your sleeve. Throw used tissues in a lined trash can, and immediately wash your hands with soap and water for at  least 20 seconds or use an alcohol-based hand rub.  Wash your [employer] your hands often and thoroughly with soap and water for at least 20 seconds. You can use an alcohol-based hand sanitizer if soap and water are not available and if your hands are not visibly dirty. Avoid touching your eyes, nose, and mouth with unwashed hands.  Directions for those who live with, or provide care at home for you:  Limit the number of people who have contact with the patient If possible, have only one caregiver for the patient. Other household members should stay in another home or place of residence. If this is not possible, they should stay in another room, or be separated from the patient as much as possible. Use a separate bathroom, if available. Restrict visitors who do not have an essential need to be in the home.  Ensure good ventilation Make sure that shared spaces in the home have good air flow, such as from an air conditioner or an opened window, weather permitting.  Wash your hands often Wash your hands often and thoroughly with soap and water for at least 20 seconds. You can use an alcohol based hand sanitizer if soap and water are not available and if your hands are not visibly dirty. Avoid touching your eyes, nose, and mouth with unwashed hands. Use disposable paper towels to dry your hands. If not available, use dedicated cloth towels and replace them when they become wet.  Wear a facemask and gloves Wear a disposable facemask at all times in the room and gloves when you touch or have contact with the patient's blood, body fluids, and/or secretions or excretions, such as sweat, saliva, sputum, nasal mucus, vomit, urine, or feces.  Ensure the mask fits over your nose and mouth tightly, and do not touch it during use. Throw out disposable facemasks and gloves after using them. Do not reuse. Wash your hands immediately after removing your facemask and gloves. If your personal clothing  becomes contaminated, carefully remove clothing and launder. Wash your hands after handling contaminated clothing. Place all used disposable facemasks, gloves, and other waste in a lined container before disposing them with other household waste. Remove gloves and wash your hands immediately after handling these items.  Do not share dishes, glasses, or other household items with the patient Avoid sharing household items. You should not share dishes, drinking glasses, cups, eating utensils, towels, bedding, or other items with a patient who is confirmed to have, or being evaluated for, COVID-19 infection. After the person uses these items, you should wash them thoroughly with soap and water.  Wash laundry thoroughly Immediately remove and wash clothes or bedding that have blood, body fluids, and/or secretions or excretions, such as sweat, saliva, sputum, nasal mucus, vomit, urine, or feces, on them. Wear gloves when handling laundry from the patient. Read  and follow directions on labels of laundry or clothing items and detergent. In general, wash and dry with the warmest temperatures recommended on the label.  Clean all areas the individual has used often Clean all touchable surfaces, such as counters, tabletops, doorknobs, bathroom fixtures, toilets, phones, keyboards, tablets, and bedside tables, every day. Also, clean any surfaces that may have blood, body fluids, and/or secretions or excretions on them. Wear gloves when cleaning surfaces the patient has come in contact with. Use a diluted bleach solution (e.g., dilute bleach with 1 part bleach and 10 parts water) or a household disinfectant with a label that says EPA-registered for coronaviruses. To make a bleach solution at home, add 1 tablespoon of bleach to 1 quart (4 cups) of water. For a larger supply, add  cup of bleach to 1 gallon (16 cups) of water. Read labels of cleaning products and follow recommendations provided on product labels.  Labels contain instructions for safe and effective use of the cleaning product including precautions you should take when applying the product, such as wearing gloves or eye protection and making sure you have good ventilation during use of the product. Remove gloves and wash hands immediately after cleaning.  Monitor yourself for signs and symptoms of illness Caregivers and household members are considered close contacts, should monitor their health, and will be asked to limit movement outside of the home to the extent possible. Follow the monitoring steps for close contacts listed on the symptom monitoring form.   Increase activity slowly   Complete by: As directed      Allergies as of 09/27/2019      Reactions   Shellfish Allergy Nausea And Vomiting      Medication List    TAKE these medications   acetaminophen 500 MG tablet Commonly known as: TYLENOL Take 500 mg by mouth every 6 (six) hours as needed for moderate pain.   amoxicillin-clavulanate 875-125 MG tablet Commonly known as: Augmentin Take 1 tablet by mouth 2 (two) times daily for 3 days.   ascorbic acid 500 MG tablet Commonly known as: VITAMIN C Take 1 tablet (500 mg total) by mouth daily for 14 days. Start taking on: September 28, 2019   dexamethasone 6 MG tablet Commonly known as: DECADRON Take 1 tablet (6 mg total) by mouth daily for 5 days.   guaiFENesin-dextromethorphan 100-10 MG/5ML syrup Commonly known as: ROBITUSSIN DM Take 10 mLs by mouth every 4 (four) hours as needed for cough.   ibuprofen 600 MG tablet Commonly known as: ADVIL Take 1 tablet (600 mg total) by mouth every 6 (six) hours.   zinc sulfate 220 (50 Zn) MG capsule Take 1 capsule (220 mg total) by mouth daily for 14 days. Start taking on: September 28, 2019      Follow-up Information    [facility]. Call in 1 week(s).   Contact information: [address] [place] [address] [phone]        Rollene Rotunda, MD. Call in 2 week(s).   Specialty: Cardiology Why: for follow up with cardiology Contact information: [address] [place] [address] [phone]          Allergies  Allergen Reactions  . Shellfish Allergy Nausea And Vomiting    The results of significant diagnostics from this hospitalization (including imaging, microbiology, ancillary and laboratory) are listed below for reference.    Microbiology: Recent Results (from the past 240 hour(s))  Blood Culture (routine x 2)  Status: None (Preliminary result)   Collection Time: 09/23/19  3:13 PM   Specimen: BLOOD LEFT FOREARM  Result Value Ref Range Status   Specimen Description   Final    BLOOD LEFT FOREARM Performed at [facility], [address], [place], [place] [address]    Special Requests   Final    BOTTLES DRAWN AEROBIC AND ANAEROBIC Blood Culture adequate volume Performed at [facility], [address] [place], [place] [address]    Culture   Final    NO GROWTH 4 DAYS Performed at [facility], [address] [place], [place] [address]    Report Status PENDING  Incomplete  Respiratory Panel by RT PCR (Flu A&B, Covid) - Nasopharyngeal Swab     Status: Abnormal   Collection Time: 09/23/19  4:48 PM   Specimen: Nasopharyngeal Swab  Result Value Ref Range Status   SARS Coronavirus 2 by RT PCR POSITIVE (A) NEGATIVE Final    Comment: RESULT CALLED TO, READ BACK BY AND VERIFIED WITH: DR.SHEK,MD [id number] BY V.WILKINS (NOTE) SARS-CoV-2 target nucleic acids are DETECTED. SARS-CoV-2 RNA is generally detectable in upper respiratory specimens  during the acute phase of infection. Positive results are indicative of the presence of the identified virus, but do not rule out bacterial infection or co-infection with other pathogens not detected by the test. Clinical correlation with patient history and other diagnostic information is  necessary to determine patient infection status. The expected result is Negative. Fact Sheet for Patients:  https://www.moore.com/ Fact Sheet for Healthcare Providers: https://www.young.biz/ This test is not yet approved or cleared by the [place] FDA and  has been authorized for detection and/or diagnosis of SARS-CoV-2 by FDA under an Emergency Use Authorization (EUA).  This EUA will remain in effect (meaning this test can be used) f or the duration of  the COVID-19 declaration under Section 564(b)(1) of the Act, 21 U.S.C. section 360bbb-3(b)(1), unless the authorization is terminated or revoked sooner.    Influenza A by PCR NEGATIVE NEGATIVE Final   Influenza B by PCR NEGATIVE NEGATIVE Final    Comment: (NOTE) The Xpert Xpress SARS-CoV-2/FLU/RSV assay is intended as an aid in  the diagnosis of influenza from Nasopharyngeal swab specimens and  should not be used as a sole basis for treatment. Nasal washings and  aspirates are unacceptable for Xpert Xpress SARS-CoV-2/FLU/RSV  testing. Fact Sheet for Patients: https://www.moore.com/ Fact Sheet for Healthcare Providers: https://www.young.biz/ This test is not yet approved or cleared by the [place] FDA and  has been authorized for detection and/or diagnosis of SARS-CoV-2 by  FDA under an Emergency Use Authorization (EUA). This EUA will remain  in effect (meaning this test can be used) for the duration of the  Covid-19 declaration under Section 564(b)(1) of the Act, 21  U.S.C. section 360bbb-3(b)(1), unless the authorization is  terminated or revoked. Performed at [facility], [address], [place], [place] [address]   Urine culture     Status: None   Collection Time: 09/23/19  5:27 PM   Specimen: In/Out Cath Urine  Result Value Ref Range Status   Specimen Description   Final    IN/OUT CATH URINE Performed at [facility], [address] [place], [place] [address]    Special Requests   Final    NONE Performed at [facility], [address], [place], [place] [address]    Culture   Final  NO GROWTH Performed at [facility], [address], [place], [place] [address]    Report Status 09/24/2019 FINAL  Final  Blood Culture (routine x 2)     Status: None (Preliminary result)   Collection Time: 09/23/19 10:05 PM   Specimen: BLOOD  Result Value Ref Range Status   Specimen Description   Final    BLOOD RIGHT ANTECUBITAL Performed at [facility], [address] [place], [place] [address]    Special Requests   Final    BOTTLES DRAWN AEROBIC ONLY Blood Culture adequate volume Performed at [facility], [address], [place], [place] [address]    Culture   Final    NO GROWTH 3 DAYS Performed at [facility], [address], [place], [place] [address]    Report Status PENDING  Incomplete  Culture, blood (x 2)     Status: None (Preliminary result)   Collection Time: 09/24/19  6:29 PM   Specimen: BLOOD  Result Value Ref Range Status   Specimen Description   Final    BLOOD RIGHT ANTECUBITAL Performed at [facility], [address], [place], [place] [address]    Special Requests   Final    BOTTLES DRAWN AEROBIC ONLY Blood Culture adequate volume Performed at [facility], [address], [place], [place] [address]    Culture   Final    NO GROWTH 3 DAYS Performed at [facility], [address] [place], [place] [address]    Report Status PENDING  Incomplete  Culture, blood (x 2)     Status: None (Preliminary result)   Collection Time: 09/24/19  6:29 PM   Specimen: BLOOD RIGHT HAND  Result Value Ref Range Status   Specimen Description   Final    BLOOD RIGHT HAND Performed at [facility], [address], [place], [place] [address]    Special  Requests   Final    BOTTLES DRAWN AEROBIC ONLY Blood Culture adequate volume Performed at [facility], [address], [place], [place] [address]    Culture   Final    NO GROWTH 3 DAYS Performed at [facility], [address], [place], [place] [address]    Report Status PENDING  Incomplete    Procedures/Studies: CT Angio Chest PE W and/or Wo Contrast  Result Date: 09/23/2019 CLINICAL DATA:  Shortness of breath EXAM: CT ANGIOGRAPHY CHEST WITH CONTRAST TECHNIQUE: Multidetector CT imaging of the chest was performed using the standard protocol during bolus administration of intravenous contrast. Multiplanar CT image reconstructions and MIPs were obtained to evaluate the vascular anatomy. CONTRAST:  OMNIPAQUE IOHEXOL 350 MG/ML SOLN COMPARISON:  None. FINDINGS: Cardiovascular: Satisfactory opacification of the pulmonary arteries to the segmental level. No evidence of pulmonary embolism. Cardiomegaly. Enlargement of the main pulmonary artery up to 3.5 cm. No pericardial effusion. Mediastinum/Nodes: No enlarged mediastinal, hilar, or axillary lymph nodes. Thyroid gland, trachea, and esophagus demonstrate no significant findings. Lungs/Pleura: There is masslike infrahilar consolidation of the right lung (series 4, image 68). Small right, trace left pleural effusions and associated atelectasis or consolidation. Upper Abdomen: No acute abnormality. Musculoskeletal: No chest wall abnormality. No acute or significant osseous findings. Review of the MIP images confirms the above findings. IMPRESSION: 1.  Negative examination for pulmonary embolism. 2. There is masslike infrahilar consolidation of the right lung. Given patient age, this is very likely reflects infection, although recommend follow-up radiographs  at 6 weeks to document complete resolution and exclude underlying mass. 3. Small right, trace left pleural effusions and associated atelectasis or consolidation. 4.   Cardiomegaly. 5. Enlargement of the main pulmonary artery up to 3.5 cm, as can be seen in pulmonary hypertension. Electronically Signed   By: Lauralyn Primes M.D.   On: 09/23/2019 17:20   DG CHEST PORT 1 VIEW  Result Date: 09/24/2019 CLINICAL DATA:  COVID-19 positive, hypoxia EXAM: PORTABLE CHEST 1 VIEW COMPARISON:  09/24/2019 at 5:45 a.m. FINDINGS: Single frontal view of the chest demonstrates persistent enlargement of the cardiac silhouette. The lung volumes are diminished, with continued bibasilar consolidation right greater than left. Trace right pleural effusion unchanged. No pneumothorax. Right hilar prominence consistent with soft tissue density on recent CT, possibly reflecting adenopathy. IMPRESSION: 1. Low lung volumes, with bibasilar consolidation most consistent with atelectasis. 2. Continued right hilar prominence, likely reflecting adenopathy. 3. Trace right pleural effusion. Electronically Signed   By: Sharlet Salina M.D.   On: 09/24/2019 13:50   Portable chest 1 View  Result Date: 09/24/2019 CLINICAL DATA:  Shortness of breath EXAM: PORTABLE CHEST 1 VIEW COMPARISON:  September 23, 2019 FINDINGS: There is a small right pleural effusion. There is bibasilar airspace opacity. Lungs elsewhere are clear. Heart is enlarged with pulmonary vascularity normal. No adenopathy. No bone lesions. IMPRESSION: Bibasilar airspace opacity, likely due to pneumonia. Small right pleural effusion. No new opacity. There is cardiomegaly with pulmonary vascularity normal. No adenopathy appreciable. Electronically Signed   By: Bretta Bang III M.D.   On: 09/24/2019 08:21   DG Chest Port 1 View  Result Date: 09/23/2019 CLINICAL DATA:  Shortness of breath. Arise from urgent care having COVID symptoms. EXAM: PORTABLE CHEST 1 VIEW COMPARISON:  None FINDINGS: Cardiac enlargement with globular appearance of the heart. This finding is likely also accentuated by the low depth of expansion and portable technique. No sign of  consolidation. No definite pleural effusion. Visualized skeletal structures are unremarkable. IMPRESSION: 1. Marked cardiac enlargement, correlate with any signs of heart failure or volume overload. With globular appearance the presence of pericardial effusion is not excluded. 2. Vascular congestion without edema. Limited assessment due to body habitus and portable technique. Electronically Signed   By: Donzetta Kohut M.D.   On: 09/23/2019 15:56   ECHOCARDIOGRAM LIMITED  Result Date: 09/25/2019    ECHOCARDIOGRAM LIMITED REPORT   Patient Name:   DIALA WAXMAN Date of Exam: 09/25/2019 Medical Rec #:  [id number]     Height:       65.0 in Accession #:    [id number]    Weight:       150.0 lb Date of Birth:  [birth year]     BSA:          1.750 m Patient Age:    28 years      BP:           88/51 mmHg Patient Gender: F             HR:           91 bpm. Exam Location:  Inpatient Procedure: Limited Echo, Cardiac Doppler and Color Doppler Indications:    Abnormal EKG  History:        Patient has no prior history of Echocardiogram examinations.                 Signs/Symptoms:Shortness of Breath; Risk Factors:Morbid obesity.  Covid positive, Pneumonia.  Sonographer:    Lavenia AtlasBrooke Strickland Referring Phys: [phone] AVA SWAYZE IMPRESSIONS  1. Only limited images obtained; full doppler not performed; normal LV function; no pericardial effusion.  2. Left ventricular ejection fraction, by estimation, is 50 to 55%. The left ventricle has low normal function. The left ventricle has no regional wall motion abnormalities. Left ventricular diastolic function could not be evaluated.  3. Right ventricular systolic function is normal. The right ventricular size is normal. There is normal pulmonary artery systolic pressure.  4. The mitral valve is normal in structure. Trivial mitral valve regurgitation.  5. The aortic valve was not well visualized. Aortic valve regurgitation is not visualized. FINDINGS  Left Ventricle: Left  ventricular ejection fraction, by estimation, is 50 to 55%. The left ventricle has low normal function. The left ventricle has no regional wall motion abnormalities. The left ventricular internal cavity size was small. There is no left ventricular hypertrophy. Right Ventricle: The right ventricular size is normal. Right ventricular systolic function is normal. There is normal pulmonary artery systolic pressure. The tricuspid regurgitant velocity is 2.19 m/s, and with an assumed right atrial pressure of 3 mmHg,  the estimated right ventricular systolic pressure is 22.2 mmHg. Left Atrium: Left atrial size was normal in size. Right Atrium: Right atrial size was normal in size. Pericardium: There is no evidence of pericardial effusion. Mitral Valve: The mitral valve is normal in structure. Trivial mitral valve regurgitation. Tricuspid Valve: The tricuspid valve is normal in structure. Tricuspid valve regurgitation is mild. Aortic Valve: The aortic valve was not well visualized. Aortic valve regurgitation is not visualized. Pulmonic Valve: The pulmonic valve was not well visualized. Venous: The inferior vena cava was not well visualized. Additional Comments: Only limited images obtained; full doppler not performed; normal LV function; no pericardial effusion.  LEFT VENTRICLE PLAX 2D LVIDd:         3.70 cm Diastology LVIDs:         2.90 cm LV e' lateral: 7.18 cm/s LV PW:         1.10 cm LV e' medial:  6.74 cm/s LV IVS:        1.20 cm  LEFT ATRIUM         Index LA diam:    3.20 cm 1.83 cm/m   AORTA Ao Root diam: 2.90 cm TRICUSPID VALVE TR Peak grad:   19.2 mmHg TR Vmax:        219.00 cm/s Olga MillersBrian Crenshaw MD Electronically signed by Olga MillersBrian Crenshaw MD Signature Date/Time: 09/25/2019/11:41:51 AM    Final    US Abdomen Limited RUQ  Result Date: 09/23/2019 CLINICAL DATA:  29 year old female with abdominal LFTs. EXAM: ULTRASOUND ABDOMEN LIMITED RIGHT UPPER QUADRANT COMPARISON:  None. FINDINGS: Gallbladder: No gallstones or  wall thickening visualized. No sonographic Murphy sign noted by sonographer. Common bile duct: Diameter: 4 mm Liver: There is diffuse increased liver echogenicity most commonly seen in the setting of fatty infiltration. Superimposed inflammation or fibrosis is not excluded. Clinical correlation is recommended. portal vein is patent on color Doppler imaging with normal direction of blood flow towards the liver. Other: None. IMPRESSION: Fatty liver, otherwise unremarkable right upper quadrant ultrasound. Electronically Signed   By: Elgie CollardArash  Radparvar M.D.   On: 09/23/2019 20:13    Labs: BNP (last 3 results) Recent Labs    09/23/19 1616  BNP 42.4   Basic Metabolic Panel: Recent Labs  Lab 09/24/19 0426 09/24/19 1418 09/25/19 0102 09/26/19 0154 09/27/19 0718  NA 134*  138 137 136 137  K 3.6 3.5 3.8 4.0 3.6  CL 105 109 109 106 106  CO2 21* 21* 21* 23 22  GLUCOSE 170* 145* 140* 119* 117*  BUN 8 8 9 11 15   CREATININE 0.71 0.73 0.65 0.69 0.74  CALCIUM 8.2* 7.9* 7.9* 8.1* 8.0*  MG 2.1  --  2.0 2.0 2.1  PHOS 2.0*  --  2.4* 3.1 3.8   Liver Function Tests: Recent Labs  Lab 09/23/19 1549 09/24/19 0426 09/25/19 0102 09/26/19 0154 09/27/19 0718  AST 77* 50* 50* 43* 33  ALT 61* 57* 49* 47* 53*  ALKPHOS 54 53 47 44 48  BILITOT 1.3* 1.3* 0.5 0.6 0.3  PROT 8.4* 7.7 7.1 6.8 6.8  ALBUMIN 3.5 3.0* 2.6* 2.4* 2.5*   No results for input(s): LIPASE, AMYLASE in the last 168 hours. No results for input(s): AMMONIA in the last 168 hours. CBC: Recent Labs  Lab 09/24/19 0426 09/24/19 1418 09/25/19 0102 09/26/19 0154 09/27/19 0718  WBC 7.1 8.1 8.9 11.0* 15.1*  NEUTROABS 6.5 7.3 7.7 9.5* 12.3*  HGB 12.8 11.6* 10.9* 11.6* 13.0  HCT 38.1 33.7* 32.4* 34.2* 38.1  MCV 92.3 90.3 90.0 90.2 89.9  PLT 176 186 168 215 314   Cardiac Enzymes: Recent Labs  Lab 09/23/19 1610  CKTOTAL 208  CKMB 0.5   BNP: Invalid input(s): POCBNP CBG: Recent Labs  Lab 09/26/19 1139 09/26/19 1633 09/26/19 2153  09/27/19 0755 09/27/19 1113  GLUCAP 103* 122* 96 100* 109*   D-Dimer Recent Labs    09/26/19 0154 09/27/19 0718  DDIMER 2.31* 1.81*   Hgb A1c Recent Labs    09/27/19 0718  HGBA1C 5.7*   Lipid Profile No results for input(s): CHOL, HDL, LDLCALC, TRIG, CHOLHDL, LDLDIRECT in the last 72 hours. Thyroid function studies No results for input(s): TSH, T4TOTAL, T3FREE, THYROIDAB in the last 72 hours.  Invalid input(s): FREET3 Anemia work up Recent Labs    09/26/19 0154 09/27/19 0718  FERRITIN 344* 323*   Urinalysis    Component Value Date/Time   COLORURINE YELLOW 09/23/2019 1727   APPEARANCEUR CLEAR 09/23/2019 1727   LABSPEC 1.010 09/23/2019 1727   PHURINE 7.0 09/23/2019 1727   GLUCOSEU NEGATIVE 09/23/2019 1727   HGBUR NEGATIVE 09/23/2019 1727   BILIRUBINUR NEGATIVE 09/23/2019 1727   KETONESUR NEGATIVE 09/23/2019 1727   PROTEINUR 100 (A) 09/23/2019 1727   NITRITE NEGATIVE 09/23/2019 1727   LEUKOCYTESUR TRACE (A) 09/23/2019 1727   Sepsis Labs Invalid input(s): PROCALCITONIN,  WBC,  LACTICIDVEN Microbiology Recent Results (from the past 240 hour(s))  Blood Culture (routine x 2)     Status: None (Preliminary result)   Collection Time: 09/23/19  3:13 PM   Specimen: BLOOD LEFT FOREARM  Result Value Ref Range Status   Specimen Description   Final    BLOOD LEFT FOREARM Performed at [facility], [address], [place], [place]    Special Requests   Final    BOTTLES DRAWN AEROBIC AND ANAEROBIC Blood Culture adequate volume Performed at [facility], [address], [place], [place]    Culture   Final    NO GROWTH 4 DAYS Performed at [facility], [address] [place], [place]    Report Status PENDING  Incomplete  Respiratory Panel by RT PCR (Flu A&B, Covid) - Nasopharyngeal Swab     Status: Abnormal   Collection Time: 09/23/19  4:48 PM   Specimen: Nasopharyngeal Swab  Result Value  Ref  Range Status   SARS Coronavirus 2 by RT PCR POSITIVE (A) NEGATIVE Final    Comment: RESULT CALLED TO, READ BACK BY AND VERIFIED WITH: DR.SHEK,MD [id number] BY V.WILKINS (NOTE) SARS-CoV-2 target nucleic acids are DETECTED. SARS-CoV-2 RNA is generally detectable in upper respiratory specimens  during the acute phase of infection. Positive results are indicative of the presence of the identified virus, but do not rule out bacterial infection or co-infection with other pathogens not detected by the test. Clinical correlation with patient history and other diagnostic information is necessary to determine patient infection status. The expected result is Negative. Fact Sheet for Patients:  https://www.moore.com/ Fact Sheet for Healthcare Providers: https://www.young.biz/ This test is not yet approved or cleared by the [place] FDA and  has been authorized for detection and/or diagnosis of SARS-CoV-2 by FDA under an Emergency Use Authorization (EUA).  This EUA will remain in effect (meaning this test can be used) f or the duration of  the COVID-19 declaration under Section 564(b)(1) of the Act, 21 U.S.C. section 360bbb-3(b)(1), unless the authorization is terminated or revoked sooner.    Influenza A by PCR NEGATIVE NEGATIVE Final   Influenza B by PCR NEGATIVE NEGATIVE Final    Comment: (NOTE) The Xpert Xpress SARS-CoV-2/FLU/RSV assay is intended as an aid in  the diagnosis of influenza from Nasopharyngeal swab specimens and  should not be used as a sole basis for treatment. Nasal washings and  aspirates are unacceptable for Xpert Xpress SARS-CoV-2/FLU/RSV  testing. Fact Sheet for Patients: https://www.moore.com/ Fact Sheet for Healthcare Providers: https://www.young.biz/ This test is not yet approved or cleared by the [place] FDA and  has been authorized for detection and/or diagnosis of SARS-CoV-2  by  FDA under an Emergency Use Authorization (EUA). This EUA will remain  in effect (meaning this test can be used) for the duration of the  Covid-19 declaration under Section 564(b)(1) of the Act, 21  U.S.C. section 360bbb-3(b)(1), unless the authorization is  terminated or revoked. Performed at [facility], [address] [place], [place] [address]   Urine culture     Status: None   Collection Time: 09/23/19  5:27 PM   Specimen: In/Out Cath Urine  Result Value Ref Range Status   Specimen Description   Final    IN/OUT CATH URINE Performed at [facility], [address] [place], [place] [address]    Special Requests   Final    NONE Performed at [facility], [address], [place], [place] [address]    Culture   Final    NO GROWTH Performed at [facility], [address], [place], [place] [address]    Report Status 09/24/2019 FINAL  Final  Blood Culture (routine x 2)     Status: None (Preliminary result)   Collection Time: 09/23/19 10:05 PM   Specimen: BLOOD  Result Value Ref Range Status   Specimen Description   Final    BLOOD RIGHT ANTECUBITAL Performed at [facility], [address] [place], [place] [address]    Special Requests   Final    BOTTLES DRAWN AEROBIC ONLY Blood Culture adequate volume Performed at [facility], [address], [place], [place] [address]    Culture   Final    NO GROWTH 3 DAYS Performed at [facility], [address] [place], [place] [address]    Report Status PENDING  Incomplete  Culture, blood (x  2)     Status: None (Preliminary result)   Collection Time: 09/24/19  6:29 PM   Specimen: BLOOD  Result Value Ref Range Status   Specimen Description   Final    BLOOD RIGHT ANTECUBITAL Performed at [place] [address] [place], [place] [address]    Special Requests   Final    BOTTLES DRAWN AEROBIC  ONLY Blood Culture adequate volume Performed at [place] [address], [place], [place] [address]    Culture   Final    NO GROWTH 3 DAYS Performed at [facility], [place] [address], [place], [place] [address]    Report Status PENDING  Incomplete  Culture, blood (x 2)     Status: None (Preliminary result)   Collection Time: 09/24/19  6:29 PM   Specimen: BLOOD RIGHT HAND  Result Value Ref Range Status   Specimen Description   Final    BLOOD RIGHT HAND Performed at [place] [address], [place], [place] [address]    Special Requests   Final    BOTTLES DRAWN AEROBIC ONLY Blood Culture adequate volume Performed at [place] [address] [place], [place] [address]    Culture   Final    NO GROWTH 3 DAYS Performed at [facility], [place] [address], [place],  [address]    Report Status PENDING  Incomplete     Time coordinating discharge: 35  minutes  SIGNED: Antonieta Pert, MD  [facility] 09/27/2019, 3:10 PM  If 7PM-7AM, please contact night-coverage [email or web address]

## 2019-09-27 NOTE — Progress Notes (Signed)
CSW called pt for PCP consult. Pt is okay with CSW establishing PCP appointment. CSW called [facility] [phone] and made PCP appointment for 11/01/19 at 1pm.

## 2019-09-27 NOTE — Progress Notes (Signed)
Progress Note  Patient Name: Brooke Casey Date of Encounter: 09/27/2019  Primary Cardiologist:   No primary care provider on file.   Subjective   She feels much better today.  Sats are better and minimal chest pain with coughing.  Still with mild cough.  Body aches are better   Inpatient Medications    Scheduled Meds: . vitamin C  500 mg Oral Daily  . azithromycin  500 mg Oral Daily  . dexamethasone (DECADRON) injection  6 mg Intravenous Q24H  . docusate sodium  100 mg Oral BID  . enoxaparin (LOVENOX) injection  40 mg Subcutaneous Q24H  . guaiFENesin  1,200 mg Oral BID  . insulin aspart  0-15 Units Subcutaneous TID WC  . Ipratropium-Albuterol  1 puff Inhalation Q6H  . melatonin  5 mg Oral QHS  . metoprolol tartrate  12.5 mg Oral BID  . pantoprazole  40 mg Oral Daily  . zinc sulfate  220 mg Oral Daily   Continuous Infusions: . cefTRIAXone (ROCEPHIN)  IV 1 g (09/27/19 0915)   PRN Meds: acetaminophen, albuterol, alum & mag hydroxide-simeth, chlorpheniramine-HYDROcodone, guaiFENesin-dextromethorphan, menthol-cetylpyridinium, morphine injection, ondansetron **OR** ondansetron (ZOFRAN) IV, traMADol   Vital Signs    Vitals:   09/26/19 2009 09/26/19 2054 09/27/19 0543 09/27/19 1115  BP:  100/66 99/70 109/69  Pulse:  80 73 86  Resp:  16 20 (!) 22  Temp:  97.8 F (36.6 C) 97.9 F (36.6 C) 98.2 F (36.8 C)  TempSrc:  Oral Oral Oral  SpO2: 93% 94% 95% 92%  Weight:      Height:        Intake/Output Summary (Last 24 hours) at 09/27/2019 1553 Last data filed at 09/27/2019 1500 Gross per 24 hour  Intake 1368.48 ml  Output --  Net 1368.48 ml   Filed Weights   09/23/19 1459  Weight: 68 kg    Telemetry    NSR - Personally Reviewed  ECG    NA -  Personally Reviewed  Physical Exam   GEN: No  acute distress.   Neck: No  JVD Cardiac: RRR, no murmurs, rubs, or gallops.  Respiratory: Clear   to auscultation bilaterally. GI: Soft, nontender, non-distended, normal  bowel sounds  MS:   No edema; No deformity. Neuro:   Nonfocal  Psych: Oriented and appropriate   Labs    Chemistry Recent Labs  Lab 09/25/19 0102 09/26/19 0154 09/27/19 0718  NA 137 136 137  K 3.8 4.0 3.6  CL 109 106 106  CO2 21* 23 22  GLUCOSE 140* 119* 117*  BUN 9 11 15   CREATININE 0.65 0.69 0.74  CALCIUM 7.9* 8.1* 8.0*  PROT 7.1 6.8 6.8  ALBUMIN 2.6* 2.4* 2.5*  AST 50* 43* 33  ALT 49* 47* 53*  ALKPHOS 47 44 48  BILITOT 0.5 0.6 0.3  GFRNONAA >60 >60 >60  GFRAA >60 >60 >60  ANIONGAP 7 7 9      Hematology Recent Labs  Lab 09/25/19 0102 09/26/19 0154 09/27/19 0718  WBC 8.9 11.0* 15.1*  RBC 3.60* 3.79* 4.24  HGB 10.9* 11.6* 13.0  HCT 32.4* 34.2* 38.1  MCV 90.0 90.2 89.9  MCH 30.3 30.6 30.7  MCHC 33.6 33.9 34.1  RDW 12.7 13.0 13.2  PLT 168 215 314    Cardiac EnzymesNo results for input(s): TROPONINI in the last 168 hours. No results for input(s): TROPIPOC in the last 168 hours.   BNP Recent Labs  Lab 09/23/19 1616  BNP 42.4  DDimer  Recent Labs  Lab 09/25/19 0102 09/26/19 0154 09/27/19 0718  DDIMER 2.77* 2.31* 1.81*     Radiology    No results found.  Cardiac Studies   ECHO:  Final pending.  No effusion .  EF OK.    Patient Profile     29 y.o. female without prior cardiac history.  Admitted with cough, muscle aches and chest pain.  Found to have COVID 19 and related pneumonia.    Assessment & Plan   CHEST PAIN:  This is improved.  No indication for change in medical therapy.  Given the borderline EF and the mild troponin elevation I am going to see her back in the office in one month for evaluation and will likely follow up another echo vs MRI in the future.  We will arrange the visit.   For questions or updates, please contact [facility] Please consult [email or web address] for contact info under Cardiology/STEMI.   Signed, Rollene Rotunda, MD  09/27/2019, 3:53 PM

## 2019-09-27 NOTE — Progress Notes (Signed)
SATURATION QUALIFICATIONS: (This note is used to comply with regulatory documentation for home oxygen)  Patient Saturations on Room Air at Rest = 99%  Patient Saturations on Room Air while Ambulating = 91%  Patient Saturations on  Liters of oxygen while Ambulating = %NT, did not drop below 88%  Please briefly explain why patient needs home oxygen:does not require Blanchard Kelch PT Acute Rehabilitation Services Pager [phone] Office [phone]

## 2019-09-27 NOTE — Progress Notes (Signed)
Discharge education and medication list reviewed. No questions or concerns at this time. Vital signs stable. Patient's husband on his way to facility for discharge.

## 2019-09-28 LAB — CULTURE, BLOOD (ROUTINE X 2)
Culture: NO GROWTH
Special Requests: ADEQUATE

## 2019-09-29 LAB — CULTURE, BLOOD (ROUTINE X 2)
Culture: NO GROWTH
Culture: NO GROWTH
Culture: NO GROWTH
Special Requests: ADEQUATE
Special Requests: ADEQUATE
Special Requests: ADEQUATE

## 2019-10-25 NOTE — Progress Notes (Signed)
Cardiology Office Note   Date:  10/26/2019   ID:  Brooke Casey, DOB 06-15-1990, MRN [id number]  PCP:  Patient, No Pcp Per  Cardiologist:   No primary care provider on file.   Chief Complaint  Patient presents with  . Shortness of Breath      History of Present Illness: Brooke Casey is a 29 y.o. female who presents for follow up post hospitalization for COVID 19.  She did have an elevated troponin of 4140.  EKG had some mild diffuse ST changes and some nondiagnostic inferior T wave changes and Q waves..  Echo had a low normal EF.   However, there is no regional wall motion.  She recovered in the hospital and had resolution of any chest discomfort that she has been having.  She had improvement in her breathing.  She went home she is actually gone quite well.  She thinks she is back to her baseline.  She is actually doing walking and some light jogging.  Her heart rate will go up if she goes up several stairs but she is not having any chest pressure, neck or arm discomfort.  There is no cough fevers or chills.  She has had no weight gain or edema.  She had no palpitations other than walking up multiple stairs and has had no presyncope or syncope.     Past Medical History:  Diagnosis Date  . HSV (herpes simplex virus) anogenital infection   . Medical history non-contributory   . Velamentous insertion of umbilical cord     Past Surgical History:  Procedure Laterality Date  . CESAREAN SECTION N/A 01/28/2017   Procedure: CESAREAN SECTION;  Surgeon: Janyth Pupa, DO;  Location: [facility];  Service: Obstetrics;  Laterality: N/A;  . NO PAST SURGERIES       No current outpatient medications on file.   No current facility-administered medications for this visit.    Allergies:   Shellfish allergy    ROS:  Please see the history of present illness.   Otherwise, review of systems are positive for none.   All other systems are reviewed and negative.    PHYSICAL  EXAM: VS:  BP 130/80   Pulse 86   Temp (!) 97.3 F (36.3 C)   Ht _0  (1.651 m)   Wt 249 lb (112.9 kg)   SpO2 96%   BMI 41.44 kg/m  , BMI Body mass index is 41.44 kg/m. GENERAL:  Well appearing NECK:  No jugular venous distention, waveform within normal limits, carotid upstroke brisk and symmetric, no bruits, no thyromegaly LUNGS:  Clear to auscultation bilaterally CHEST:  Unremarkable HEART:  PMI not displaced or sustained,S1 and S2 within normal limits, no S3, no S4, no clicks, no rubs, no murmurs ABD:  Flat, positive bowel sounds normal in frequency in pitch, no bruits, no rebound, no guarding, no midline pulsatile mass, no hepatomegaly, no splenomegaly EXT:  2 plus pulses throughout, no edema, no cyanosis no clubbing   EKG:  EKG is ordered today. The ekg ordered today demonstrates sinus rhythm, rate 86, axis within normal limits, inferior Q waves questionable old inferior infarct, lateral Q waves as well, short PR interval, nonspecific inferior T wave inversions slightly more pronounced than previous.   Recent Labs: 09/23/2019: B Natriuretic Peptide 42.4 09/27/2019: ALT 53; BUN 15; Creatinine, Ser 0.74; Hemoglobin 13.0; Magnesium 2.1; Platelets 314; Potassium 3.6; Sodium 137    Lipid Panel No results found for: CHOL, TRIG, HDL, CHOLHDL, VLDL,  [place], LDLDIRECT    Wt Readings from Last 3 Encounters:  10/26/19 249 lb (112.9 kg)  09/23/19 150 lb (68 kg)  08/16/18 261 lb 8 oz (118.6 kg)      Other studies Reviewed: Additional studies/ records that were reviewed today include: None. Review of the above records demonstrates:  Please see elsewhere in the note.     ASSESSMENT AND PLAN:  ACUTE RESPIRATORY DISTRESS SECONDARY TO COVID: She seems to have recovered completely from a respiratory standpoint.  However, I am not have low threshold for testing is below.  ELEVATED TROPONIN: Today I am going to check high-sensitivity troponin, CRP and sed rate and high-sensitivity  troponin to see if there is any suggestion of ongoing inflammation.  If this is abnormal she is going to need an MRI..  If not she will have follow-up visit with me an echocardiogram.     Current medicines are reviewed at length with the patient today.  The patient does not have concerns regarding medicines.  The following changes have been made:  no change  Labs/ tests ordered today include:   Orders Placed This Encounter  Procedures  . Sed Rate (ESR)  . C-reactive protein  . EKG 12-Lead     Disposition:   FU with me in six months.     Signed, Minus Breeding, MD  10/26/2019 12:16 PM    [place]

## 2019-10-26 ENCOUNTER — Other Ambulatory Visit: Payer: Self-pay

## 2019-10-26 ENCOUNTER — Encounter: Payer: Self-pay | Admitting: Cardiology

## 2019-10-26 ENCOUNTER — Ambulatory Visit (INDEPENDENT_AMBULATORY_CARE_PROVIDER_SITE_OTHER): Payer: Medicaid Other | Admitting: Cardiology

## 2019-10-26 ENCOUNTER — Other Ambulatory Visit: Payer: Medicaid Other | Admitting: *Deleted

## 2019-10-26 VITALS — BP 130/80 | HR 86 | Temp 97.3°F | Ht 65.0 in | Wt 249.0 lb

## 2019-10-26 DIAGNOSIS — R778 Other specified abnormalities of plasma proteins: Secondary | ICD-10-CM

## 2019-10-26 DIAGNOSIS — Z7189 Other specified counseling: Secondary | ICD-10-CM

## 2019-10-26 NOTE — Patient Instructions (Signed)
Medication Instructions:  NO CHANGES *If you need a refill on your cardiac medications before your next appointment, please call your pharmacy*  Lab Work: Your physician recommends that you return for lab work TODAY (SED, CRP, TROP) [address] 1:30pm  Testing/Procedures: NONE ORDERED THIS VISIT  Follow-Up: At [facility], you and your health needs are our priority.  As part of our continuing mission to provide you with exceptional heart care, we have created designated Provider Care Teams.  These Care Teams include your primary Cardiologist (physician) and Advanced Practice Providers (APPs -  Physician Assistants and Nurse Practitioners) who all work together to provide you with the care you need, when you need it.  We recommend signing up for the patient portal called "MyChart".  Sign up information is provided on this After Visit Summary.  MyChart is used to connect with patients for Virtual Visits (Telemedicine).  Patients are able to view lab/test results, encounter notes, upcoming appointments, etc.  Non-urgent messages can be sent to your provider as well.   To learn more about what you can do with MyChart, go to [email or web address].    Your next appointment:   6 month(s)  You will receive a reminder letter in the mail two months in advance. If you don't receive a letter, please call our office to schedule the follow-up appointment.  The format for your next appointment:   In Person  Provider:   Rollene Rotunda, MD

## 2019-10-27 LAB — SEDIMENTATION RATE: Sed Rate: 53 mm/hr — ABNORMAL HIGH (ref 0–32)

## 2019-10-27 LAB — C-REACTIVE PROTEIN: CRP: 7 mg/L (ref 0–10)

## 2019-11-01 ENCOUNTER — Other Ambulatory Visit: Payer: Self-pay

## 2019-11-01 ENCOUNTER — Ambulatory Visit (INDEPENDENT_AMBULATORY_CARE_PROVIDER_SITE_OTHER): Payer: Medicaid Other | Admitting: Nurse Practitioner

## 2019-11-01 ENCOUNTER — Telehealth: Payer: Self-pay

## 2019-11-01 ENCOUNTER — Encounter: Payer: Self-pay | Admitting: Nurse Practitioner

## 2019-11-01 VITALS — BP 114/75 | HR 89 | Temp 98.0°F | Ht 65.0 in | Wt 252.0 lb

## 2019-11-01 DIAGNOSIS — Z6841 Body Mass Index (BMI) 40.0 and over, adult: Secondary | ICD-10-CM | POA: Diagnosis not present

## 2019-11-01 DIAGNOSIS — Z7689 Persons encountering health services in other specified circumstances: Secondary | ICD-10-CM | POA: Diagnosis not present

## 2019-11-01 DIAGNOSIS — J1282 Pneumonia due to coronavirus disease 2019: Secondary | ICD-10-CM

## 2019-11-01 DIAGNOSIS — R0602 Shortness of breath: Secondary | ICD-10-CM

## 2019-11-01 DIAGNOSIS — R739 Hyperglycemia, unspecified: Secondary | ICD-10-CM

## 2019-11-01 DIAGNOSIS — Z8616 Personal history of COVID-19: Secondary | ICD-10-CM | POA: Diagnosis not present

## 2019-11-01 DIAGNOSIS — R778 Other specified abnormalities of plasma proteins: Secondary | ICD-10-CM

## 2019-11-01 LAB — POCT GLYCOSYLATED HEMOGLOBIN (HGB A1C): Hemoglobin A1C: 5 % (ref 4.0–5.6)

## 2019-11-01 LAB — GLUCOSE, POCT (MANUAL RESULT ENTRY): POC Glucose: 77 mg/dl (ref 70–99)

## 2019-11-01 NOTE — Telephone Encounter (Signed)
Cardiac MRI ordered per Dr. Antoine Poche.  Order placed for troponin I, previous order was cancelled by lab.  New order placed and scheduled for patient's next available day.

## 2019-11-01 NOTE — Progress Notes (Signed)
[facility] Patient [facility] [address] [place], [place]  [address] Phone:  [phone]   Fax:  [phone]   New Patient Office Visit  Subjective:  Patient ID: Brooke Casey, female    DOB: 05/19/1991  Age: 29 y.o. MRN: [id number]  CC:  Chief Complaint  Patient presents with  . New Patient (Initial Visit)    ED 09/23/2019 Hypoxia, Sepsis    HPI Brooke Casey presents for follow up. She  has a past medical history of HSV (herpes simplex virus) anogenital infection, Medical history non-contributory, and Velamentous insertion of umbilical cord.   Upper Respiratory Infection Patient is following up from [facility] related to COV-19. Symptoms include fever, shortness of breath and fatigue. Onset of symptoms was 6 weeks ago, and has been completely resolved since that time.  She was hospitalized for approximately 4 days treatment to date: remdesivir and Decadron.  She admits that she does not believe that she had Covid.  On 09/24/2019 while in the hospital she developed chest pains and her troponin levels increased from 72 greater than 4000.  MI was ruled out.  She has recently followed up with cardiology.  She is waiting results for further testing with echocardiogram versus MRI.  She is working on her weight. She admits that this is the heaviest that she is ever being. She had abdominal pain on Oct 04, 2019 after being released from the hosipital no additonal symptoms. Denies headache, dizziness, visual changes, shortness of breath, dyspnea on exertion, chest pain, nausea, vomiting or any edema.   Breast Reduction She would also like to discussbreast reduction.  The patient complains of back pain and complications consistent with symptomatic hypermastia.  She admits that her current bra size is 40K.  This does interfere with her ability to exercise however she does push through.  She admits that she was able to breast-feed her son however is no longer breast-feeding.  Past Medical History:    Diagnosis Date  . HSV (herpes simplex virus) anogenital infection   . Medical history non-contributory   . Velamentous insertion of umbilical cord     Past Surgical History:  Procedure Laterality Date  . CESAREAN SECTION N/A 01/28/2017   Procedure: CESAREAN SECTION;  Surgeon: Myna Hidalgo, DO;  [facility]: [facility];  Service: Obstetrics;  Laterality: N/A;  . NO PAST SURGERIES      History reviewed. No pertinent family history.  Social History   Socioeconomic History  . Marital status: Single    Spouse name: Not on file  . Number of children: Not on file  . Years of education: Not on file  . Highest education level: Not on file  Occupational History  . Not on file  Tobacco Use  . Smoking status: Never Smoker  . Smokeless tobacco: Never Used  Substance and Sexual Activity  . Alcohol use: Yes    Comment: rare  . Drug use: No  . Sexual activity: Yes    Birth control/protection: Implant  Other Topics Concern  . Not on file  Social History Narrative  . Not on file   Social Determinants of Health   Financial Resource Strain:   . Difficulty of Paying Living Expenses:   Food Insecurity:   . Worried About Programme researcher, broadcasting/film/video in the Last Year:   . Barista in the Last Year:   Transportation Needs:   . Freight forwarder (Medical):   Marland Kitchen Lack of Transportation (Non-Medical):   Physical Activity:   .  Days of Exercise per Week:   . Minutes of Exercise per Session:   Stress:   . Feeling of Stress :   Social Connections:   . Frequency of Communication with Friends and Family:   . Frequency of Social Gatherings with Friends and Family:   . Attends Religious Services:   . Active Member of Clubs or Organizations:   . Attends Archivist Meetings:   Marland Kitchen Marital Status:   Intimate Partner Violence:   . Fear of Current or Ex-Partner:   . Emotionally Abused:   Marland Kitchen Physically Abused:   . Sexually Abused:     ROS Review of Systems  Objective:    Today's Vitals: BP 114/75   Pulse 89   Temp 98 F (36.7 C)   Ht 5\' 5"  (1.651 m)   Wt 252 lb (114.3 kg)   SpO2 98%   Breastfeeding Unknown Comment: NEXPLANON  BMI 41.93 kg/m  She stopped breastfeeding 5 mon ago.   Physical Exam Constitutional:      Appearance: She is obese.  HENT:     Head: Normocephalic.     Nose: Nose normal.     Mouth/Throat:     Mouth: Mucous membranes are moist.     Pharynx: Oropharynx is clear.  Eyes:     Pupils: Pupils are equal, round, and reactive to light.  Cardiovascular:     Rate and Rhythm: Normal rate and regular rhythm.     Pulses: Normal pulses.     Heart sounds: Normal heart sounds.  Pulmonary:     Effort: Pulmonary effort is normal.     Breath sounds: Normal breath sounds.  Abdominal:     General: Bowel sounds are normal.  Musculoskeletal:        General: Normal range of motion.     Cervical back: Normal range of motion.  Skin:    General: Skin is warm and dry.     Capillary Refill: Capillary refill takes less than 2 seconds.  Neurological:     General: No focal deficit present.     Mental Status: She is alert and oriented to person, place, and time.  Psychiatric:        Mood and Affect: Mood normal.        Behavior: Behavior normal.        Thought Content: Thought content normal.        Judgment: Judgment normal.     Assessment & Plan:   Problem List Items Addressed This Visit      Respiratory   Pneumonia due to COVID-19 virus - Primary     Other   BMI 40.0-44.9, adult ([place])   Relevant Orders   Amb Ref to Medical Weight Management   Encounter to establish care   Hyperglycemia   Relevant Orders   POCT glucose (manual entry) (Completed)   POCT glycosylated hemoglobin (Hb A1C) (Completed)      No outpatient encounter medications on file as of 11/01/2019.   No facility-administered encounter medications on file as of 11/01/2019.    Follow-up: Return for follow up as needed.   Vevelyn Francois, NP

## 2019-11-01 NOTE — Telephone Encounter (Signed)
-----   Message from Rollene Rotunda, MD sent at 10/29/2019  6:54 PM EDT ----- The sed rate is still elevated.  I would like to check a cardiac MRI with gad to rule out myocarditis.  Call Ms. Goostree with the results

## 2019-11-01 NOTE — Patient Instructions (Signed)
Healthy Eating Following a healthy eating pattern may help you to achieve and maintain a healthy body weight, reduce the risk of chronic disease, and live a long and productive life. It is important to follow a healthy eating pattern at an appropriate calorie level for your body. Your nutritional needs should be met primarily through food by choosing a variety of nutrient-rich foods. What are tips for following this plan? Reading food labels  Read labels and choose the following: ? Reduced or low sodium. ? Juices with 100% fruit juice. ? Foods with low saturated fats and high polyunsaturated and monounsaturated fats. ? Foods with whole grains, such as whole wheat, cracked wheat, brown rice, and wild rice. ? Whole grains that are fortified with folic acid. This is recommended for women who are pregnant or who want to become pregnant.  Read labels and avoid the following: ? Foods with a lot of added sugars. These include foods that contain brown sugar, corn sweetener, corn syrup, dextrose, fructose, glucose, high-fructose corn syrup, honey, invert sugar, lactose, malt syrup, maltose, molasses, raw sugar, sucrose, trehalose, or turbinado sugar.  Do not eat more than the following amounts of added sugar per day:  6 teaspoons (25 g) for women.  9 teaspoons (38 g) for men. ? Foods that contain processed or refined starches and grains. ? Refined grain products, such as white flour, degermed cornmeal, white bread, and white rice. Shopping  Choose nutrient-rich snacks, such as vegetables, whole fruits, and nuts. Avoid high-calorie and high-sugar snacks, such as potato chips, fruit snacks, and candy.  Use oil-based dressings and spreads on foods instead of solid fats such as butter, stick margarine, or cream cheese.  Limit pre-made sauces, mixes, and "instant" products such as flavored rice, instant noodles, and ready-made pasta.  Try more plant-protein sources, such as tofu, tempeh, black beans,  edamame, lentils, nuts, and seeds.  Explore eating plans such as the Mediterranean diet or vegetarian diet. Cooking  Use oil to saut or stir-fry foods instead of solid fats such as butter, stick margarine, or lard.  Try baking, boiling, grilling, or broiling instead of frying.  Remove the fatty part of meats before cooking.  Steam vegetables in water or broth. Meal planning   At meals, imagine dividing your plate into fourths: ? One-half of your plate is fruits and vegetables. ? One-fourth of your plate is whole grains. ? One-fourth of your plate is protein, especially lean meats, poultry, eggs, tofu, beans, or nuts.  Include low-fat dairy as part of your daily diet. Lifestyle  Choose healthy options in all settings, including home, work, school, restaurants, or stores.  Prepare your food safely: ? Wash your hands after handling raw meats. ? Keep food preparation surfaces clean by regularly washing with hot, soapy water. ? Keep raw meats separate from ready-to-eat foods, such as fruits and vegetables. ? Cook seafood, meat, poultry, and eggs to the recommended internal temperature. ? Store foods at safe temperatures. In general:  Keep cold foods at 59F (4.4C) or below.  Keep hot foods at 159F (60C) or above.  Keep your freezer at [facility] (-17.8C) or below.  Foods are no longer safe to eat when they have been between the temperatures of 40-159F (4.4-60C) for more than 2 hours. What foods should I eat? Fruits Aim to eat 2 cup-equivalents of fresh, canned (in natural juice), or frozen fruits each day. Examples of 1 cup-equivalent of fruit include 1 small apple, 8 large strawberries, 1 cup canned fruit,  cup  dried fruit, or 1 cup 100% juice. Vegetables Aim to eat 2-3 cup-equivalents of fresh and frozen vegetables each day, including different varieties and colors. Examples of 1 cup-equivalent of vegetables include 2 medium carrots, 2 cups raw, leafy greens, 1 cup chopped  vegetable (raw or cooked), or 1 medium baked potato. Grains Aim to eat 6 ounce-equivalents of whole grains each day. Examples of 1 ounce-equivalent of grains include 1 slice of bread, 1 cup ready-to-eat cereal, 3 cups popcorn, or  cup cooked rice, pasta, or cereal. Meats and other proteins Aim to eat 5-6 ounce-equivalents of protein each day. Examples of 1 ounce-equivalent of protein include 1 egg, 1/2 cup nuts or seeds, or 1 tablespoon (16 g) peanut butter. A cut of meat or fish that is the size of a deck of cards is about 3-4 ounce-equivalents.  Of the protein you eat each week, try to have at least 8 ounces come from seafood. This includes salmon, trout, herring, and anchovies. Dairy Aim to eat 3 cup-equivalents of fat-free or low-fat dairy each day. Examples of 1 cup-equivalent of dairy include 1 cup (240 mL) milk, 8 ounces (250 g) yogurt, 1 ounces (44 g) natural cheese, or 1 cup (240 mL) fortified soy milk. Fats and oils  Aim for about 5 teaspoons (21 g) per day. Choose monounsaturated fats, such as canola and olive oils, avocados, peanut butter, and most nuts, or polyunsaturated fats, such as sunflower, corn, and soybean oils, walnuts, pine nuts, sesame seeds, sunflower seeds, and flaxseed. Beverages  Aim for six 8-oz glasses of water per day. Limit coffee to three to five 8-oz cups per day.  Limit caffeinated beverages that have added calories, such as soda and energy drinks.  Limit alcohol intake to no more than 1 drink a day for nonpregnant women and 2 drinks a day for men. One drink equals 12 oz of beer (355 mL), 5 oz of wine (148 mL), or 1 oz of hard liquor (44 mL). Seasoning and other foods  Avoid adding excess amounts of salt to your foods. Try flavoring foods with herbs and spices instead of salt.  Avoid adding sugar to foods.  Try using oil-based dressings, sauces, and spreads instead of solid fats. This information is based on general [place] nutrition guidelines. For more  information, visit Build[employer].es. Exact amounts may vary based on your nutrition needs. Summary  A healthy eating plan may help you to maintain a healthy weight, reduce the risk of chronic diseases, and stay active throughout your life.  Plan your meals. Make sure you eat the right portions of a variety of nutrient-rich foods.  Try baking, boiling, grilling, or broiling instead of frying.  Choose healthy options in all settings, including home, work, school, restaurants, or stores. This information is not intended to replace advice given to you by your health care provider. Make sure you discuss any questions you have with your health care provider. Document Revised: 08/29/2017 Document Reviewed: 08/29/2017 Elsevier Patient Education  [place].

## 2019-11-02 DIAGNOSIS — Z7689 Persons encountering health services in other specified circumstances: Secondary | ICD-10-CM | POA: Insufficient documentation

## 2019-11-07 ENCOUNTER — Other Ambulatory Visit: Payer: Medicaid Other

## 2019-11-07 ENCOUNTER — Other Ambulatory Visit: Payer: Self-pay

## 2019-11-07 DIAGNOSIS — R778 Other specified abnormalities of plasma proteins: Secondary | ICD-10-CM

## 2019-11-07 DIAGNOSIS — R0602 Shortness of breath: Secondary | ICD-10-CM

## 2019-11-07 LAB — TROPONIN I (HIGH SENSITIVITY): Troponin I (High Sensitivity): 3 ng/L (ref <18)

## 2019-11-07 NOTE — Progress Notes (Signed)
High sensitivity troponin had to be entered again, system error causing lab to be cancelled prior to collection.

## 2019-11-12 ENCOUNTER — Encounter (HOSPITAL_COMMUNITY): Payer: Self-pay

## 2019-11-12 ENCOUNTER — Other Ambulatory Visit: Payer: Self-pay

## 2019-11-12 ENCOUNTER — Ambulatory Visit (HOSPITAL_COMMUNITY)
Admission: EM | Admit: 2019-11-12 | Discharge: 2019-11-12 | Disposition: A | Discharge status: Home / Self Care | Payer: Medicaid Other | Attending: Internal Medicine | Admitting: Internal Medicine

## 2019-11-12 DIAGNOSIS — T1591XA Foreign body on external eye, part unspecified, right eye, initial encounter: Secondary | ICD-10-CM

## 2019-11-12 MED ORDER — SULFACETAMIDE SODIUM 10 % OP SOLN: 1.0000 [drp] | OPHTHALMIC | 0 refills | Status: DC

## 2019-11-12 MED ORDER — TETRACAINE HCL 0.5 % OP SOLN
OPHTHALMIC | Status: AC
  Filled 2019-11-12: qty 4

## 2019-11-12 MED ORDER — KETOROLAC TROMETHAMINE 0.5 % OP SOLN
1.0000 [drp] | Freq: Four times a day (QID) | OPHTHALMIC | 0 refills | Status: DC
Start: 2019-11-12 — End: 2021-01-15

## 2019-11-12 MED ORDER — FLUORESCEIN SODIUM 1 MG OP STRP
ORAL_STRIP | OPHTHALMIC | Status: AC
  Filled 2019-11-12: qty 2

## 2019-11-12 NOTE — ED Provider Notes (Signed)
[facility]    CSN: [id number] Arrival date & time: 11/12/19  1216      History   Chief Complaint Chief Complaint  Patient presents with   Foreign Body in Eye    HPI Brooke Casey is a 29 y.o. female comes to the urgent care with irritation of the right eye and feeling of foreign body in the left eye.  This started after she turned her neck conditioning to high level.  She has had watery eyes and redness of the eyes.  After the incident patient irrigated the eye for several minutes.  She denies any photophobia.  No headaches.  No blurry vision Past Medical History:  Diagnosis Date   HSV (herpes simplex virus) anogenital infection    Medical history non-contributory    Velamentous insertion of umbilical cord     Patient Active Problem List   Diagnosis Date Noted   Encounter to establish care 11/02/2019   Pneumonia 09/23/2019   Hypokalemia 09/23/2019   Abnormal LFTs 09/23/2019   Hyperbilirubinemia 09/23/2019   Thrombocytopenia ([place]) 09/23/2019   Hyperglycemia 09/23/2019   Sepsis ([place]) 09/23/2019   Pneumonia involving right lung 09/23/2019   Acute respiratory failure with hypoxia ([place]) 09/23/2019   COVID-19 09/23/2019   Pneumonia due to COVID-19 virus 09/23/2019   COVID-19 virus infection 09/23/2019   Normal postpartum course 08/18/2018   Normal labor 08/16/2018   Status post primary low transverse cesarean section 01/31/2017   Pre-eclampsia, mild 01/31/2017   LGA (large for gestational age) infant 01/31/2017   HSV (herpes simplex virus) anogenital infection 01/27/2017   BMI 40.0-44.9, adult ([place]) 01/27/2017   Shellfish allergy 01/27/2017    Past Surgical History:  Procedure Laterality Date   CESAREAN SECTION N/A 01/28/2017   Procedure: CESAREAN SECTION;  Surgeon: Janyth Pupa, DO;  Location: [place];  Service: Obstetrics;  Laterality: N/A;   NO PAST SURGERIES      OB History    Gravida  2   Para  2   Term    2   Preterm      AB      Living  2     SAB      TAB      Ectopic      Multiple  0   Live Births  2            Home Medications    Prior to Admission medications   Medication Sig Start Date End Date Taking? Authorizing Provider  ketorolac (ACULAR) 0.5 % ophthalmic solution Place 1 drop into the right eye every 6 (six) hours. 11/12/19   Mozell Haber, Myrene Galas, MD  sulfacetamide (BLEPH-10) 10 % ophthalmic solution Place 1-2 drops into the right eye every 4 (four) hours. 11/12/19   Reham Slabaugh, Myrene Galas, MD    Family History History reviewed. No pertinent family history.  Social History Social History   Tobacco Use   Smoking status: Never Smoker   Smokeless tobacco: Never Used  Scientific laboratory technician Use: Never used  Substance Use Topics   Alcohol use: Yes    Comment: rare   Drug use: No     Allergies   Shellfish allergy   Review of Systems Review of Systems  HENT: Negative.   Eyes: Positive for pain, discharge and redness. Negative for photophobia, itching and visual disturbance.  Respiratory: Negative.      Physical Exam Triage Vital Signs ED Triage Vitals  Enc Vitals Group     BP  11/12/19 1250 124/79     Pulse Rate 11/12/19 1250 64     Resp 11/12/19 1250 13     Temp 11/12/19 1250 98.4 F (36.9 C)     Temp Source 11/12/19 1250 Oral     SpO2 11/12/19 1250 100 %     Weight --      Height --      Head Circumference --      Peak Flow --      Pain Score 11/12/19 1248 8     Pain Loc --      Pain Edu? --      Excl. in GC? --    No data found.  Updated Vital Signs BP 124/79 (BP Location: Right Arm)    Pulse 64    Temp 98.4 F (36.9 C) (Oral)    Resp 13    SpO2 100%   Visual Acuity Right Eye Distance:   Left Eye Distance:   Bilateral Distance:    Right Eye Near:   Left Eye Near:    Bilateral Near:     Physical Exam Vitals and nursing note reviewed.  Constitutional:      General: She is in acute distress.     Appearance: She is not  ill-appearing.  HENT:     Right Ear: Tympanic membrane normal.     Left Ear: Tympanic membrane normal.     Mouth/Throat:     Mouth: Mucous membranes are moist.     Pharynx: No oropharyngeal exudate or posterior oropharyngeal erythema.  Eyes:     Extraocular Movements: Extraocular movements intact.     Pupils: Pupils are equal, round, and reactive to light.     Comments: Conjunctival erythema on the medial aspect of the right eye.  No corneal haziness.  Neurological:     Mental Status: She is alert.      UC Treatments / Results  Labs (all labs ordered are listed, but only abnormal results are displayed) Labs Reviewed - No data to display  EKG   Radiology No results found.  Procedures Procedures (including critical care time)  Medications Ordered in UC Medications - No data to display  Initial Impression / Assessment and Plan / UC Course  I have reviewed the triage vital signs and the nursing notes.  Pertinent labs & imaging results that were available during my care of the patient were reviewed by me and considered in my medical decision making (see chart for details).     1.  Foreign body in the right eye: Fluorescein stain is negative for corneal abrasion/ulcerations. 1 mm foreign body is removed from the palpebral conjunctiva of the left upper eyelid. Ketorolac eyedrops as needed for pain Sulfacetamide ophthalmic solution every 4 hours while awake If patient symptoms worsens she is advised to return to urgent care to be reevaluated. Patient verbalized understanding of the plan of care. Final Clinical Impressions(s) / UC Diagnoses   Final diagnoses:  Foreign body, eye, right, initial encounter   Discharge Instructions   None    ED Prescriptions    Medication Sig Dispense Auth. Provider   ketorolac (ACULAR) 0.5 % ophthalmic solution Place 1 drop into the right eye every 6 (six) hours. 5 mL Brogan England, Britta Mccreedy, MD   sulfacetamide (BLEPH-10) 10 % ophthalmic  solution Place 1-2 drops into the right eye every 4 (four) hours. 10 mL Dyan Creelman, Britta Mccreedy, MD     PDMP not reviewed this encounter.   Merrilee Jansky, MD 11/12/19  2024 ° °

## 2019-11-12 NOTE — ED Triage Notes (Signed)
Patient reports that she felt something fly into her eye last night. Reports swelling, redness, and pain today.

## 2019-11-19 ENCOUNTER — Telehealth: Payer: Self-pay | Admitting: *Deleted

## 2019-11-19 ENCOUNTER — Encounter: Payer: Self-pay | Admitting: Cardiology

## 2019-11-19 NOTE — Telephone Encounter (Signed)
Left message for patient to call regarding appointment for Cardiac MRI ordered by  Dr. Antoine Poche

## 2019-11-19 NOTE — Telephone Encounter (Signed)
Spoke with patient regarding appointment for Cardiac MRI scheduled Wednesday 12/26/19 at 8:00 am---arrival time is 7:15 am 1st floor admissions office at [facility].  Will mail information to patient and she voiced her understanding.

## 2019-12-14 ENCOUNTER — Other Ambulatory Visit (HOSPITAL_COMMUNITY): Payer: Medicaid Other

## 2019-12-24 ENCOUNTER — Telehealth: Payer: Self-pay | Admitting: *Deleted

## 2019-12-24 NOTE — Telephone Encounter (Signed)
Spoke with patient regarding appointment for Cardiac MRI scheduled 12/26/19 at 8:00 am at [facility]---- the prior authorization through her new Medicaid managed care insurance is still pending---I informed her I will call once we have heard from them and reschedule.  She voiced her understanding.

## 2019-12-26 ENCOUNTER — Ambulatory Visit (HOSPITAL_COMMUNITY): Payer: Medicaid Other

## 2020-01-22 ENCOUNTER — Telehealth: Payer: Self-pay | Admitting: Cardiology

## 2020-01-22 NOTE — Telephone Encounter (Signed)
Spoke with patient regarding appointment for Cardiac MRI---patient relayed preferred dates--informed her I will call tomorrow with her appointment.

## 2020-01-25 ENCOUNTER — Encounter: Payer: Self-pay | Admitting: Cardiology

## 2020-01-25 NOTE — Telephone Encounter (Signed)
Spoke with patient regarding appointment for Cardiac MRi scheduled Tuesday 02/26/20 at 12:00 pm at [facility]----arrival time is 11:30 am---1st floor admissions office.  Will mail information to patient---she voiced her understanding.

## 2020-02-25 ENCOUNTER — Telehealth (HOSPITAL_COMMUNITY): Payer: Self-pay | Admitting: Emergency Medicine

## 2020-02-25 NOTE — Telephone Encounter (Signed)
Reaching out to patient to offer assistance regarding upcoming cardiac imaging study; pt verbalizes understanding of appt date/time, parking situation and where to check in, pre-test NPO status and medications ordered, and verified current allergies; name and call back number provided for further questions should they arise Rockwell Alexandria RN Navigator Cardiac Imaging [facility] [phone] office [phone] cell   Pt denies metal implants, unsure about claustro (never had an MRI before)

## 2020-02-26 ENCOUNTER — Ambulatory Visit (HOSPITAL_COMMUNITY)
Admission: RE | Admit: 2020-02-26 | Discharge: 2020-02-26 | Disposition: A | Discharge status: Home / Self Care | Payer: Medicaid Other | Source: Ambulatory Visit | Attending: Cardiology | Admitting: Cardiology

## 2020-02-26 DIAGNOSIS — R0602 Shortness of breath: Secondary | ICD-10-CM

## 2020-02-26 MED ORDER — GADOBUTROL 1 MMOL/ML IV SOLN
10.0000 mL | Freq: Once | INTRAVENOUS | Status: AC | PRN
  Administered 2020-02-26: 10 mL via INTRAVENOUS

## 2020-03-31 DIAGNOSIS — R0602 Shortness of breath: Secondary | ICD-10-CM | POA: Insufficient documentation

## 2020-03-31 DIAGNOSIS — R778 Other specified abnormalities of plasma proteins: Secondary | ICD-10-CM | POA: Insufficient documentation

## 2020-03-31 NOTE — Progress Notes (Signed)
Cardiology Office Note   Date:  04/02/2020   ID:  Brooke Casey, DOB Dec 28, 1990, MRN [id number]  PCP:  Barbette Merino, NP  Cardiologist:   No primary care provider on file.   Chief Complaint  Patient presents with  . Abnormal ECG      History of Present Illness: Brooke Casey is a 29 y.o. female who presents for follow up post hospitalization for COVID 19.  She did have an elevated troponin of 4140.  EKG had some mild diffuse ST changes and some nondiagnostic inferior T wave changes and Q waves..  Echo had a low normal EF.   However, there is no regional wall motion abnormalities.   MRI demonstrated focal hypokinesis and endocardial LGE suggesting a small embolic non transmural infarct.  Since I last saw her she has done well.  She walks a parking garage stairs and walks around them for exercise. The patient denies any new symptoms such as chest discomfort, neck or arm discomfort. There has been no new shortness of breath, PND or orthopnea. There have been no reported palpitations, presyncope or syncope.    Past Medical History:  Diagnosis Date  . HSV (herpes simplex virus) anogenital infection   . Medical history non-contributory   . Velamentous insertion of umbilical cord     Past Surgical History:  Procedure Laterality Date  . CESAREAN SECTION N/A 01/28/2017   Procedure: CESAREAN SECTION;  Surgeon: Myna Hidalgo, DO;  [facility]: [facility];  Service: Obstetrics;  Laterality: N/A;  . NO PAST SURGERIES       Current Outpatient Medications  Medication Sig Dispense Refill  . ketorolac (ACULAR) 0.5 % ophthalmic solution Place 1 drop into the right eye every 6 (six) hours. (Patient not taking: Reported on 04/02/2020) 5 mL 0  . sulfacetamide (BLEPH-10) 10 % ophthalmic solution Place 1-2 drops into the right eye every 4 (four) hours. (Patient not taking: Reported on 04/02/2020) 10 mL 0   No current facility-administered medications for this visit.    Allergies:    Shellfish allergy    ROS:  Please see the history of present illness.   Otherwise, review of systems are positive for none.   All other systems are reviewed and negative.    PHYSICAL EXAM: VS:  BP 100/70 (BP Location: Left Arm, Patient Position: Sitting)   Pulse 80   Ht 5\' 5"  (1.651 m)   Wt 257 lb 9.6 oz (116.8 kg)   SpO2 99%   BMI 42.87 kg/m  , BMI Body mass index is 42.87 kg/m. GENERAL:  Well appearing NECK:  No jugular venous distention, waveform within normal limits, carotid upstroke brisk and symmetric, no bruits, no thyromegaly LUNGS:  Clear to auscultation bilaterally CHEST:  Unremarkable HEART:  PMI not displaced or sustained,S1 and S2 within normal limits, no S3, no S4, no clicks, no rubs, no murmurs ABD:  Flat, positive bowel sounds normal in frequency in pitch, no bruits, no rebound, no guarding, no midline pulsatile mass, no hepatomegaly, no splenomegaly EXT:  2 plus pulses throughout, no edema, no cyanosis no clubbing   EKG:  EKG is  ordered today. The ekg ordered today demonstrates sinus rhythm, rate 73, axis within normal limits, intervals within normal limits, no acute ST-T wave changes.  Recent Labs: 09/23/2019: B Natriuretic Peptide 42.4 09/27/2019: ALT 53; BUN 15; Creatinine, Ser 0.74; Hemoglobin 13.0; Magnesium 2.1; Platelets 314; Potassium 3.6; Sodium 137    Lipid Panel No results found for: CHOL, TRIG, HDL, CHOLHDL,  VLDL, LDLCALC, LDLDIRECT    Wt Readings from Last 3 Encounters:  04/02/20 257 lb 9.6 oz (116.8 kg)  11/01/19 252 lb (114.3 kg)  10/26/19 249 lb (112.9 kg)      Other studies Reviewed: Additional studies/ records that were reviewed today include: MRI. Review of the above records demonstrates:  Please see elsewhere in the note.     ASSESSMENT AND PLAN:  ACUTE RESPIRATORY DISTRESS SECONDARY TO COVID:    She is completely recovered from this.  No further testing.  ELEVATED TROPONIN:  She had evidence of a small non transmural infarct  that was likely embolic and related to COVID infection.  There was no evidence of myocarditis or pericarditis.  No further work-up is indicated.  Her EKG abnormalities actually resolved.   Current medicines are reviewed at length with the patient today.  The patient does not have concerns regarding medicines.  The following changes have been made:  None  Labs/ tests ordered today include: None  Orders Placed This Encounter  Procedures  . EKG 12-Lead     Disposition:   FU with me as needed.   Signed, Rollene Rotunda, MD  04/02/2020 10:51 AM    [facility]

## 2020-04-02 ENCOUNTER — Other Ambulatory Visit: Payer: Self-pay

## 2020-04-02 ENCOUNTER — Ambulatory Visit (INDEPENDENT_AMBULATORY_CARE_PROVIDER_SITE_OTHER): Payer: Medicaid Other | Admitting: Cardiology

## 2020-04-02 ENCOUNTER — Encounter: Payer: Self-pay | Admitting: Cardiology

## 2020-04-02 VITALS — BP 100/70 | HR 80 | Ht 65.0 in | Wt 257.6 lb

## 2020-04-02 DIAGNOSIS — R0602 Shortness of breath: Secondary | ICD-10-CM

## 2020-04-02 DIAGNOSIS — R778 Other specified abnormalities of plasma proteins: Secondary | ICD-10-CM | POA: Diagnosis not present

## 2020-04-02 NOTE — Patient Instructions (Signed)
Medication Instructions:  No changes  *If you need a refill on your cardiac medications before your next appointment, please call your pharmacy*   Lab Work: Not needed   Testing/Procedures: Not needed   Follow-Up: At [facility], you and your health needs are our priority.  As part of our continuing mission to provide you with exceptional heart care, we have created designated Provider Care Teams.  These Care Teams include your primary Cardiologist (physician) and Advanced Practice Providers (APPs -  Physician Assistants and Nurse Practitioners) who all work together to provide you with the care you need, when you need it.  We recommend signing up for the patient portal called "MyChart".  Sign up information is provided on this After Visit Summary.  MyChart is used to connect with patients for Virtual Visits (Telemedicine).  Patients are able to view lab/test results, encounter notes, upcoming appointments, etc.  Non-urgent messages can be sent to your provider as well.   To learn more about what you can do with MyChart, go to [email or web address].    Your next appointment:     as needed  The format for your next appointment:   In Person  Provider:   Rollene Rotunda, MD

## 2020-10-02 DIAGNOSIS — Z20822 Contact with and (suspected) exposure to covid-19: Secondary | ICD-10-CM | POA: Diagnosis not present

## 2021-01-14 NOTE — Progress Notes (Signed)
New Patient Note  RE: Brooke Casey Natzke MRN: [id number] DOB: 04/25/1991 Date of Office Visit: 01/15/2021  Consult requested by: No ref. provider found Primary care provider: Barbette MerinoKing, Crystal M, NP  Chief Complaint: Eczema  History of Present Illness: I had the pleasure of seeing Brooke Casey for initial evaluation at the [facility] on 01/15/2021. She is a 30 y.o. female, who is self-referred here for the evaluation of eczema.  Patient had eczema whole her life and noted worsening symptoms starting in June 2022.  Mainly occurs on her feet, and arms. Describes them as itchy, dry, scaly. Flares usually lasts for a few weeks. Associated symptoms include: none. Suspected triggers are unknown - but worse with using tea tree oil.  Denies any changes in medications, foods, personal care products. Currently using Cetaphil, Vaseline and dial soap, tide with oxyclean, dryer sheets.    She has tried the following therapies: cortisone cream with some benefit.  Previous work up includes: none. Patient is up to date with the following cancer screening tests: physical exam, pap smears.  Assessment and Plan: Samule Casey is a 30 y.o. female with: Other atopic dermatitis Eczema on and off throughout her life however started flaring in June 2022.  Denies any changes in medications, diet or personal care products.  Tried over-the-counter cortisone cream with some benefit.  Concerned about allergic triggers. Today's skin testing showed: Positive to grass, weed pollen, ragweed, trees, mold, dust mites, cat, dog, cockroach.  Positive to shrimp, crab, lobster, oyster, scallops.  Borderline to wheat. See below for proper skin care - make sure you are using fragrance free and dye free products.  Change laundry detergent to free and clear. No dryer sheets or fabric softener. Read about Dupixent injections. Use triamcinolone 0.1% ointment twice a day as needed for rash flares. Do not use on the face, neck,  armpits or groin area. Do not use more than 3 weeks in a row.  Thick, stubborn areas (legs and feet): mometasone 0.1% ointment twice daily as needed to affected areas (up to 3 weeks in a row); stop when clear and can restart as needed for flares.  Itching: Take Zyrtec 10mg  in the morning Take hydroxyzine 10mg  1 hour before bed  May try to eliminate wheat from diet for a few weeks and see if there's any improvement in eczema. This is most likely irrelevant sensitization due to her eczema as patient tolerates with no reactions.  Other allergic rhinitis Rhinoconjunctivitis symptoms in the spring and fall.  Usually does not take any medication for this.  No prior allergy testing. Today's skin testing showed: Positive to grass, weed pollen, ragweed, trees, mold, dust mites, cat, dog, cockroach.  Start environmental control measures as below. Use over the counter antihistamines such as Zyrtec (cetirizine), Claritin (loratadine), Allegra (fexofenadine), or Xyzal (levocetirizine) daily as needed. May take twice a day during allergy flares. May switch antihistamines every few months. Consider allergy injections for long term control if above medications do not help the symptoms - handout given.   Other adverse food reactions, not elsewhere classified, subsequent encounter Shellfish causes itching and nausea.  No prior evaluation. Today's skin testing showed: Positive to shrimp, crab, lobster, oyster, scallops.   Continue to avoid shellfish and mollusks I have prescribed epinephrine injectable and demonstrated proper use. For mild symptoms you can take over the counter antihistamines such as Benadryl and monitor symptoms closely. If symptoms worsen or if you have severe symptoms including breathing issues, throat closure,  significant swelling, whole body hives, severe diarrhea and vomiting, lightheadedness then inject epinephrine and seek immediate medical care afterwards. Action plan given.  Return in  about 2 months (around 03/17/2021).  Meds ordered this encounter  Medications   EPINEPHrine 0.3 mg/0.3 mL IJ SOAJ injection    Sig: Inject 0.3 mg into the muscle as needed for anaphylaxis.    Dispense:  1 each    Refill:  2    May dispense generic/Mylan/Teva brand.   triamcinolone ointment (KENALOG) 0.1 %    Sig: Apply 1 application topically 2 (two) times daily as needed (rash flare). Do not use on the face, neck, armpits or groin area. Do not use more than 3 weeks in a row.    Dispense:  80 g    Refill:  2   mometasone (ELOCON) 0.1 % ointment    Sig: Apply topically 2 (two) times daily as needed (tough, eczema spots). Do not use on the face, neck, armpits or groin area. Do not use more than 3 weeks in a row.    Dispense:  45 g    Refill:  2   cetirizine (ZYRTEC ALLERGY) 10 MG tablet    Sig: Take 1 tablet (10 mg total) by mouth daily.    Dispense:  30 tablet    Refill:  5   hydrOXYzine (ATARAX/VISTARIL) 10 MG tablet    Sig: Take 1 tablet (10 mg total) by mouth at bedtime as needed for itching.    Dispense:  30 tablet    Refill:  3    Lab Orders  No laboratory test(s) ordered today    Other allergy screening: Asthma: no Rhino conjunctivitis: yes Itchy eyes and sneezing in the spring and fall. Takes no medications for this. No prior allergy testing.  Food allergy:  Shellfish caused itching of the hands and nausea.  Past work up includes:none. Dietary History: patient has been eating other foods including milk, eggs, peanut, treenuts, sesame, fish, soy, wheat, meats, fruits and vegetables.  She reports reading labels and avoiding shellfish in diet completely.   Medication allergy: no Hymenoptera allergy: no History of recurrent infections suggestive of immunodeficency: no  Diagnostics: Skin Testing: Environmental allergy panel and select foods. Positive to grass, weed pollen, ragweed, trees, mold, dust mites, cat, dog, cockroach.  Positive to shrimp, crab, lobster,  oyster, scallops.  Borderline to wheat. Results discussed with patient/family.  Airborne Adult Perc - 01/15/21 1539     Time Antigen Placed 1530    Allergen Manufacturer Waynette Buttery    Location Back    Number of Test 59    1. Control-Buffer 50% Glycerol Negative    2. Control-Histamine 1 mg/ml 2+    3. Albumin saline Negative    4. Bahia 4+    5. [place] 4+    6. Johnson 4+    7. Kentucky Blue 2+    8. Meadow Fescue 3+    9. Perennial Rye 4+    10. Sweet Vernal 3+    11. Timothy 4+    12. Cocklebur Negative    13. Burweed Marshelder Negative    14. Ragweed, short Negative    15. Ragweed, Giant Negative    16. Plantain,  English 2+    17. Lamb's Quarters Negative    18. Sheep Sorrell Negative    19. Rough Pigweed Negative    20. Marsh Elder, Rough Negative    21. Mugwort, Common Negative    22. Ash mix Negative  23Charletta Cousin mix 3+    24. Beech American 2+    25. Box, Elder Negative    26. Cedar, red Negative    27. Cottonwood, Guinea-Bissau Negative    28. Elm mix Negative    29. Hickory 4+    30. Maple mix Negative    31. Oak, Guinea-Bissau mix 2+    32. Pecan Pollen 4+    33. Pine mix Negative    34. Sycamore Eastern Negative    35. Walnut, Black Pollen 2+    36. Alternaria alternata Negative    37. Cladosporium Herbarum 4+    38. Aspergillus mix Negative    39. Penicillium mix 3+    40. Bipolaris sorokiniana (Helminthosporium) 2+    41. Drechslera spicifera (Curvularia) 2+    42. Mucor plumbeus Negative    43. Fusarium moniliforme 2+    44. Aureobasidium pullulans (pullulara) 2+    45. Rhizopus oryzae Negative    46. Botrytis cinera --   +/-   47. Epicoccum nigrum 2+    48. Phoma betae 2+    49. Candida Albicans 3+    50. Trichophyton mentagrophytes 4+    51. Mite, D Farinae  5,000 AU/ml 4+    52. Mite, D Pteronyssinus  5,000 AU/ml 4+    53. Cat Hair 10,000 BAU/ml 4+    54.  Dog Epithelia Negative    55. Mixed Feathers Negative    56. Horse Epithelia Negative    57.  Cockroach, German 3+    58. Mouse Negative    59. Tobacco Leaf Negative             Food Perc - 01/15/21 1539       Test Information   Time Antigen Placed 1530    Allergen Manufacturer Waynette Buttery    Location Back    Number of allergen test 10      Food   1. Peanut Negative    2. Soybean food Negative    3. Wheat, whole --   +/-   4. Sesame Negative    5. Milk, cow Negative    6. Egg White, chicken Negative    7. Casein Negative    8. Shellfish mix Negative    9. Fish mix Negative    10. Cashew Negative             Intradermal - 01/15/21 1542     Time Antigen Placed 1540    Allergen Manufacturer Waynette Buttery    Location Arm    Number of Test 3    Control Negative    Ragweed mix 3+    Dog 3+             Food Adult Perc - 01/15/21 1500     Time Antigen Placed 1530    Allergen Manufacturer Waynette Buttery    Location Back    Number of allergen test 5    25. Shrimp --   12x5   26. Crab --   10x6   27. Lobster --   9x5   28. Oyster --   9x6   29. Scallops --   13x5            Past Medical History: Patient Active Problem List   Diagnosis Date Noted   Other atopic dermatitis 01/15/2021   Other allergic rhinitis 01/15/2021   Other adverse food reactions, not elsewhere classified, subsequent encounter 01/15/2021   Elevated troponin 03/31/2020   Shortness of breath 03/31/2020  Encounter to establish care 11/02/2019   Pneumonia 09/23/2019   Hypokalemia 09/23/2019   Abnormal LFTs 09/23/2019   Hyperbilirubinemia 09/23/2019   Thrombocytopenia (HCC) 09/23/2019   Hyperglycemia 09/23/2019   Sepsis (HCC) 09/23/2019   Pneumonia involving right lung 09/23/2019   Acute respiratory failure with hypoxia (HCC) 09/23/2019   COVID-19 09/23/2019   Pneumonia due to COVID-19 virus 09/23/2019   COVID-19 virus infection 09/23/2019   Normal postpartum course 08/18/2018   Normal labor 08/16/2018   Status post primary low transverse cesarean section 01/31/2017   Pre-eclampsia,  mild 01/31/2017   LGA (large for gestational age) infant 01/31/2017   HSV (herpes simplex virus) anogenital infection 01/27/2017   BMI 40.0-44.9, adult (HCC) 01/27/2017   Shellfish allergy 01/27/2017   Past Medical History:  Diagnosis Date   Eczema    HSV (herpes simplex virus) anogenital infection    Medical history non-contributory    Velamentous insertion of umbilical cord    Past Surgical History: Past Surgical History:  Procedure Laterality Date   CESAREAN SECTION N/A 01/28/2017   Procedure: CESAREAN SECTION;  Surgeon: Myna Hidalgo, DO;  [facility];  Service: Obstetrics;  Laterality: N/A;   Medication List:  Current Outpatient Medications  Medication Sig Dispense Refill   cetirizine (ZYRTEC ALLERGY) 10 MG tablet Take 1 tablet (10 mg total) by mouth daily. 30 tablet 5   EPINEPHrine 0.3 mg/0.3 mL IJ SOAJ injection Inject 0.3 mg into the muscle as needed for anaphylaxis. 1 each 2   hydrOXYzine (ATARAX/VISTARIL) 10 MG tablet Take 1 tablet (10 mg total) by mouth at bedtime as needed for itching. 30 tablet 3   mometasone (ELOCON) 0.1 % ointment Apply topically 2 (two) times daily as needed (tough, eczema spots). Do not use on the face, neck, armpits or groin area. Do not use more than 3 weeks in a row. 45 g 2   triamcinolone ointment (KENALOG) 0.1 % Apply 1 application topically 2 (two) times daily as needed (rash flare). Do not use on the face, neck, armpits or groin area. Do not use more than 3 weeks in a row. 80 g 2   No current facility-administered medications for this visit.   Allergies: Allergies  Allergen Reactions   Shellfish Allergy Nausea And Vomiting   Social History: Social History   Socioeconomic History   Marital status: Married    Spouse name: Not on file   Number of children: Not on file   Years of education: Not on file   Highest education level: Not on file  Occupational History   Not on file  Tobacco Use   Smoking status: Never    Smokeless tobacco: Never  Vaping Use   Vaping Use: Never used  Substance and Sexual Activity   Alcohol use: Yes    Comment: rare   Drug use: No   Sexual activity: Yes    Birth control/protection: Implant  Other Topics Concern   Not on file  Social History Narrative   Not on file   Social Determinants of Health   Financial Resource Strain: Not on file  Food Insecurity: Not on file  Transportation Needs: Not on file  Physical Activity: Not on file  Stress: Not on file  Social Connections: Not on file   Lives in a house [place] in [place]. Smoking: denies Occupation: Designer, television/film set History: Water Damage/mildew in the house: no Carpet in the family room: yes Carpet in the bedroom: no Heating: gas Cooling: central Pet: no  Family History: Family History  Problem Relation Age of Onset   Allergic rhinitis Neg Hx    Angioedema Neg Hx    Asthma Neg Hx    Atopy Neg Hx    Eczema Neg Hx    Immunodeficiency Neg Hx    Urticaria Neg Hx    Review of Systems  Constitutional:  Negative for appetite change, chills, fever and unexpected weight change.  HENT:  Negative for congestion and rhinorrhea.   Eyes:  Positive for itching.  Respiratory:  Negative for cough, chest tightness, shortness of breath and wheezing.   Cardiovascular:  Negative for chest pain.  Gastrointestinal:  Negative for abdominal pain.  Genitourinary:  Negative for difficulty urinating.  Skin:  Positive for rash.  Allergic/Immunologic: Positive for environmental allergies and food allergies.  Neurological:  Negative for headaches.    Objective: BP 100/70   Pulse 85   Temp 98.9 F (37.2 C) (Temporal)   Resp 18   Ht 5\' 5"  (1.651 m)   Wt 248 lb 6.4 oz (112.7 kg)   SpO2 97%   BMI 41.34 kg/m  Body mass index is 41.34 kg/m. Physical Exam Vitals and nursing note reviewed.  Constitutional:      Appearance: Normal appearance. She is well-developed.  HENT:     Head: Normocephalic and  atraumatic.     Right Ear: Tympanic membrane and external ear normal.     Left Ear: Tympanic membrane and external ear normal.     Nose: Nose normal.     Mouth/Throat:     Mouth: Mucous membranes are moist.     Pharynx: Oropharynx is clear.  Eyes:     Conjunctiva/sclera: Conjunctivae normal.  Cardiovascular:     Rate and Rhythm: Normal rate and regular rhythm.     Heart sounds: Normal heart sounds. No murmur heard.   No friction rub. No gallop.  Pulmonary:     Effort: Pulmonary effort is normal.     Breath sounds: Normal breath sounds. No wheezing, rhonchi or rales.  Musculoskeletal:     Cervical back: Neck supple.  Skin:    General: Skin is warm and dry.     Findings: Rash present.     Comments: Hyperpigmented, leathery skin changes on ankles and feet bilaterally.  Milder dry patches on antecubital fossa bilaterally.  Neurological:     Mental Status: She is alert and oriented to person, place, and time.  Psychiatric:        Behavior: Behavior normal.  The plan was reviewed with the patient/family, and all questions/concerned were addressed.  It was my pleasure to see Gerda today and participate in her care. Please feel free to contact me with any questions or concerns.  Sincerely,  , DO Allergy & Immunology  [facility] office: [phone] [facility] office: [phone]

## 2021-01-15 ENCOUNTER — Ambulatory Visit (INDEPENDENT_AMBULATORY_CARE_PROVIDER_SITE_OTHER): Payer: 59 | Admitting: Allergy

## 2021-01-15 ENCOUNTER — Other Ambulatory Visit: Payer: Self-pay

## 2021-01-15 ENCOUNTER — Encounter: Payer: Self-pay | Admitting: Allergy

## 2021-01-15 VITALS — BP 100/70 | HR 85 | Temp 98.9°F | Resp 18 | Ht 65.0 in | Wt 248.4 lb

## 2021-01-15 DIAGNOSIS — J3089 Other allergic rhinitis: Secondary | ICD-10-CM

## 2021-01-15 DIAGNOSIS — L2089 Other atopic dermatitis: Secondary | ICD-10-CM

## 2021-01-15 DIAGNOSIS — T781XXD Other adverse food reactions, not elsewhere classified, subsequent encounter: Secondary | ICD-10-CM | POA: Insufficient documentation

## 2021-01-15 MED ORDER — TRIAMCINOLONE ACETONIDE 0.1 % EX OINT
1.0000 | TOPICAL_OINTMENT | Freq: Two times a day (BID) | CUTANEOUS | 2 refills | Status: DC | PRN
Start: 2021-01-15 — End: 2023-05-12

## 2021-01-15 MED ORDER — CETIRIZINE HCL 10 MG PO TABS: 10.0000 mg | ORAL_TABLET | Freq: Every day | ORAL | 5 refills | Status: DC

## 2021-01-15 MED ORDER — EPINEPHRINE 0.3 MG/0.3ML IJ SOAJ: 0.3000 mg | INTRAMUSCULAR | 2 refills | Status: AC | PRN

## 2021-01-15 MED ORDER — MOMETASONE FUROATE 0.1 % EX OINT: TOPICAL_OINTMENT | Freq: Two times a day (BID) | CUTANEOUS | 2 refills | Status: DC | PRN

## 2021-01-15 MED ORDER — HYDROXYZINE HCL 10 MG PO TABS
10.0000 mg | ORAL_TABLET | Freq: Every evening | ORAL | 3 refills | Status: DC | PRN
Start: 2021-01-15 — End: 2023-05-12

## 2021-01-15 NOTE — Assessment & Plan Note (Signed)
Rhinoconjunctivitis symptoms in the spring and fall.  Usually does not take any medication for this.  No prior allergy testing.  Today's skin testing showed: Positive to grass, weed pollen, ragweed, trees, mold, dust mites, cat, dog, cockroach.   Start environmental control measures as below.  Use over the counter antihistamines such as Zyrtec (cetirizine), Claritin (loratadine), Allegra (fexofenadine), or Xyzal (levocetirizine) daily as needed. May take twice a day during allergy flares. May switch antihistamines every few months.  Consider allergy injections for long term control if above medications do not help the symptoms - handout given.

## 2021-01-15 NOTE — Assessment & Plan Note (Signed)
Shellfish causes itching and nausea.  No prior evaluation. . Today's skin testing showed: Positive to shrimp, crab, lobster, oyster, scallops.   . Continue to avoid shellfish and mollusks . I have prescribed epinephrine injectable and demonstrated proper use. For mild symptoms you can take over the counter antihistamines such as Benadryl and monitor symptoms closely. If symptoms worsen or if you have severe symptoms including breathing issues, throat closure, significant swelling, whole body hives, severe diarrhea and vomiting, lightheadedness then inject epinephrine and seek immediate medical care afterwards. . Action plan given.

## 2021-01-15 NOTE — Assessment & Plan Note (Signed)
Eczema on and off throughout her life however started flaring in June 2022.  Denies any changes in medications, diet or personal care products.  Tried over-the-counter cortisone cream with some benefit.  Concerned about allergic triggers. . Today's skin testing showed: Positive to grass, weed pollen, ragweed, trees, mold, dust mites, cat, dog, cockroach.  Positive to shrimp, crab, lobster, oyster, scallops.  Borderline to wheat. . See below for proper skin care - make sure you are using fragrance free and dye free products.  o Change laundry detergent to free and clear. No dryer sheets or fabric softener. . Read about Dupixent injections. . Use triamcinolone 0.1% ointment twice a day as needed for rash flares. Do not use on the face, neck, armpits or groin area. Do not use more than 3 weeks in a row.  . Thick, stubborn areas (legs and feet): mometasone 0.1% ointment twice daily as needed to affected areas (up to 3 weeks in a row); stop when clear and can restart as needed for flares.  . Itching: . Take Zyrtec 10mg  in the morning . Take hydroxyzine 10mg  1 hour before bed   May try to eliminate wheat from diet for a few weeks and see if there's any improvement in eczema. This is most likely irrelevant sensitization due to her eczema as patient tolerates with no reactions.

## 2021-01-15 NOTE — Patient Instructions (Addendum)
Today's skin testing showed: Positive to grass, weed pollen, ragweed, trees, mold, dust mites, cat, dog, cockroach.  Positive to shrimp, crab, lobster, oyster, scallops.  Borderline to wheat - most likely false positive.  Results given.  Eczema: See below for proper skin care - make sure you are using fragrance free and dye free products.  Change laundry detergent to free and clear. No dryer sheets or fabric softener. Read about Dupixent injections. Use triamcinolone 0.1% ointment twice a day as needed for rash flares. Do not use on the face, neck, armpits or groin area. Do not use more than 3 weeks in a row.  Thick, stubborn areas (legs and feet): mometasone 0.1% ointment twice daily as needed to affected areas (up to 3 weeks in a row); stop when clear and can restart as needed for flares.  Itching: Take Zyrtec 10mg  in the morning Take hydroxyzine 10mg  1 hour before bed   Environmental allergies Start environmental control measures as below. Use over the counter antihistamines such as Zyrtec (cetirizine), Claritin (loratadine), Allegra (fexofenadine), or Xyzal (levocetirizine) daily as needed. May take twice a day during allergy flares. May switch antihistamines every few months. Consider allergy injections for long term control if above medications do not help the symptoms - handout given.   Food allergy: Continue to avoid shellfish and mollusks I have prescribed epinephrine injectable and demonstrated proper use. For mild symptoms you can take over the counter antihistamines such as Benadryl and monitor symptoms closely. If symptoms worsen or if you have severe symptoms including breathing issues, throat closure, significant swelling, whole body hives, severe diarrhea and vomiting, lightheadedness then inject epinephrine and seek immediate medical care afterwards. Action plan given.  Follow up in 2 months or sooner if needed.    Skin care recommendations  Bath time: Always use  lukewarm water. AVOID very hot or cold water. Keep bathing time to 5-10 minutes. Do NOT use bubble bath. Use a mild soap and use just enough to wash the dirty areas. Do NOT scrub skin vigorously.  After bathing, pat dry your skin with a towel. Do NOT rub or scrub the skin.  Moisturizers and prescriptions:  ALWAYS apply moisturizers immediately after bathing (within 3 minutes). This helps to lock-in moisture. Use the moisturizer several times a day over the whole body. Good summer moisturizers include: Aveeno, CeraVe, Cetaphil. Good winter moisturizers include: Aquaphor, Vaseline, Cerave, Cetaphil, Eucerin, Vanicream. When using moisturizers along with medications, the moisturizer should be applied about one hour after applying the medication to prevent diluting effect of the medication or moisturize around where you applied the medications. When not using medications, the moisturizer can be continued twice daily as maintenance.  Laundry and clothing: Avoid laundry products with added color or perfumes. Use unscented hypo-allergenic laundry products such as Tide free, Cheer free & gentle, and All free and clear.  If the skin still seems dry or sensitive, you can try double-rinsing the clothes. Avoid tight or scratchy clothing such as wool. Do not use fabric softeners or dyer sheets.  Reducing Pollen Exposure Pollen seasons: trees (spring), grass (summer) and ragweed/weeds (fall). Keep windows closed in your home and car to lower pollen exposure.  Install air conditioning in the bedroom and throughout the house if possible.  Avoid going out in dry windy days - especially early morning. Pollen counts are highest between 5 - 10 AM and on dry, hot and windy days.  Save outside activities for late afternoon or after a heavy rain, when pollen  levels are lower.  Avoid mowing of grass if you have grass pollen allergy. Be aware that pollen can also be transported indoors on people and pets.  Dry  your clothes in an automatic dryer rather than hanging them outside where they might collect pollen.  Rinse hair and eyes before bedtime. Mold Control Mold and fungi can grow on a variety of surfaces provided certain temperature and moisture conditions exist.  Outdoor molds grow on plants, decaying vegetation and soil. The major outdoor mold, Alternaria and Cladosporium, are found in very high numbers during hot and dry conditions. Generally, a late summer - fall peak is seen for common outdoor fungal spores. Rain will temporarily lower outdoor mold spore count, but counts rise rapidly when the rainy period ends. The most important indoor molds are Aspergillus and Penicillium. Dark, humid and poorly ventilated basements are ideal sites for mold growth. The next most common sites of mold growth are the bathroom and the kitchen. Outdoor (Seasonal) Mold Control Use air conditioning and keep windows closed. Avoid exposure to decaying vegetation. Avoid leaf raking. Avoid grain handling. Consider wearing a face mask if working in moldy areas.  Indoor (Perennial) Mold Control  Maintain humidity below 50%. Get rid of mold growth on hard surfaces with water, detergent and, if necessary, 5% bleach (do not mix with other cleaners). Then dry the area completely. If mold covers an area more than 10 square feet, consider hiring an indoor environmental professional. For clothing, washing with soap and water is best. If moldy items cannot be cleaned and dried, throw them away. Remove sources e.g. contaminated carpets. Repair and seal leaking roofs or pipes. Using dehumidifiers in damp basements may be helpful, but empty the water and clean units regularly to prevent mildew from forming. All rooms, especially basements, bathrooms and kitchens, require ventilation and cleaning to deter mold and mildew growth. Avoid carpeting on concrete or damp floors, and storing items in damp areas. Control of House Dust Mite  Allergen Dust mite allergens are a common trigger of allergy and asthma symptoms. While they can be found throughout the house, these microscopic creatures thrive in warm, humid environments such as bedding, upholstered furniture and carpeting. Because so much time is spent in the bedroom, it is essential to reduce mite levels there.  Encase pillows, mattresses, and box springs in special allergen-proof fabric covers or airtight, zippered plastic covers.  Bedding should be washed weekly in hot water (130 F) and dried in a hot dryer. Allergen-proof covers are available for comforters and pillows that can't be regularly washed.  Wash the allergy-proof covers every few months. Minimize clutter in the bedroom. Keep pets out of the bedroom.  Keep humidity less than 50% by using a dehumidifier or air conditioning. You can buy a humidity measuring device called a hygrometer to monitor this.  If possible, replace carpets with hardwood, linoleum, or washable area rugs. If that's not possible, vacuum frequently with a vacuum that has a HEPA filter. Remove all upholstered furniture and non-washable window drapes from the bedroom. Remove all non-washable stuffed toys from the bedroom.  Wash stuffed toys weekly. Pet Allergen Avoidance: Contrary to popular opinion, there are no "hypoallergenic" breeds of dogs or cats. That is because people are not allergic to an animal's hair, but to an allergen found in the animal's saliva, dander (dead skin flakes) or urine. Pet allergy symptoms typically occur within minutes. For some people, symptoms can build up and become most severe 8 to 12 hours after  contact with the animal. People with severe allergies can experience reactions in public places if dander has been transported on the pet owners' clothing. Keeping an animal outdoors is only a partial solution, since homes with pets in the yard still have higher concentrations of animal allergens. Before getting a pet, ask  your allergist to determine if you are allergic to animals. If your pet is already considered part of your family, try to minimize contact and keep the pet out of the bedroom and other rooms where you spend a great deal of time. As with dust mites, vacuum carpets often or replace carpet with a hardwood floor, tile or linoleum. High-efficiency particulate air (HEPA) cleaners can reduce allergen levels over time. While dander and saliva are the source of cat and dog allergens, urine is the source of allergens from rabbits, hamsters, mice and [place] pigs; so ask a non-allergic family member to clean the animal's cage. If you have a pet allergy, talk to your allergist about the potential for allergy immunotherapy (allergy shots). This strategy can often provide long-term relief. Cockroach Allergen Avoidance Cockroaches are often found in the homes of densely populated urban areas, schools or commercial buildings, but these creatures can lurk almost anywhere. This does not mean that you have a dirty house or living area. Block all areas where roaches can enter the home. This includes crevices, wall cracks and windows.  Cockroaches need water to survive, so fix and seal all leaky faucets and pipes. Have an exterminator go through the house when your family and pets are gone to eliminate any remaining roaches. Keep food in lidded containers and put pet food dishes away after your pets are done eating. Vacuum and sweep the floor after meals, and take out garbage and recyclables. Use lidded garbage containers in the kitchen. Wash dishes immediately after use and clean under stoves, refrigerators or toasters where crumbs can accumulate. Wipe off the stove and other kitchen surfaces and cupboards regularly.

## 2021-03-17 ENCOUNTER — Other Ambulatory Visit: Payer: Self-pay

## 2021-03-17 ENCOUNTER — Ambulatory Visit (INDEPENDENT_AMBULATORY_CARE_PROVIDER_SITE_OTHER): Payer: 59 | Admitting: Allergy

## 2021-03-17 ENCOUNTER — Encounter: Payer: Self-pay | Admitting: Allergy

## 2021-03-17 VITALS — BP 122/76 | HR 66 | Temp 97.6°F | Resp 16 | Ht 65.0 in | Wt <= 1120 oz

## 2021-03-17 DIAGNOSIS — J3089 Other allergic rhinitis: Secondary | ICD-10-CM

## 2021-03-17 DIAGNOSIS — J302 Other seasonal allergic rhinitis: Secondary | ICD-10-CM

## 2021-03-17 DIAGNOSIS — L2089 Other atopic dermatitis: Secondary | ICD-10-CM

## 2021-03-17 DIAGNOSIS — T781XXD Other adverse food reactions, not elsewhere classified, subsequent encounter: Secondary | ICD-10-CM | POA: Diagnosis not present

## 2021-03-17 DIAGNOSIS — H1010 Acute atopic conjunctivitis, unspecified eye: Secondary | ICD-10-CM

## 2021-03-17 NOTE — Progress Notes (Signed)
Follow Up Note  RE: Brooke Casey MRN: [id number] DOB: [birth year] Date of Office Visit: 03/17/2021  Referring provider: Barbette Merino, NP Primary care provider: Barbette Merino, NP  Chief Complaint: Eczema (No bad flares )  History of Present Illness: I had the pleasure of seeing Brooke Casey for a follow up visit at the [facility] on 03/17/2021. She is a 30 y.o. female, who is being followed for atopic dermatitis, allergic rhinoconjunctivitis and food allergy. Her previous allergy office visit was on 01/15/2021 with Dr. Selena Batten. Today is a regular follow up visit.  Atopic dermatitis Used mometasone and triamcinolone with good benefit on her feet. Happy with this regimen and not interested in Dupixent.  Using hydroxyzine only as needed.   Allergic rhinitis Sometimes the dust irritates her throat. Zyrtec caused drowsiness even if taking it at night.    Food allergy  Avoiding shellfish and mollusks No reactions since the last visit.   Assessment and Plan: Brooke Casey is a 30 y.o. female with: Other atopic dermatitis Past history - Eczema on and off throughout her life however started flaring in June 2022.  Interim history - improved with topical steroid creams. Not interested in Dupixent at this time. See below for proper skin care - make sure you are using fragrance free and dye free products.  Use a thick ointment or moisturizer for the winter months.  Use triamcinolone 0.1% ointment twice a day as needed for rash flares. Do not use on the face, neck, armpits or groin area. Do not use more than 3 weeks in a row.  Thick, stubborn areas (legs and feet): mometasone 0.1% ointment twice daily as needed to affected areas (up to 3 weeks in a row); stop when clear and can restart as needed for flares.  Itching: Take hydroxyzine 10mg  1 hour before bed as needed.   Seasonal and perennial allergic rhinoconjunctivitis Past history - Rhinoconjunctivitis symptoms in the  spring and fall.  2022 skin testing showed: Positive to grass, weed pollen, ragweed, trees, mold, dust mites, cat, dog, cockroach.  Interim history - zyrtec works but causes drowsiness even if takes at night.  Continue environmental control measures as below. Try Claritin (loratadine), Allegra (fexofenadine), or Xyzal (levocetirizine) daily as needed. May switch antihistamines every few months. If it still makes you drowsy, you can try to take 1/2 tablet instead of a full tablet.  Food allergy Past history - Shellfish causes itching and nausea.  2022 skin testing showed: Positive to shrimp, crab, lobster, oyster, scallops.   Continue to avoid shellfish and mollusks For mild symptoms you can take over the counter antihistamines such as Benadryl and monitor symptoms closely. If symptoms worsen or if you have severe symptoms including breathing issues, throat closure, significant swelling, whole body hives, severe diarrhea and vomiting, lightheadedness then inject epinephrine and seek immediate medical care afterwards. Action plan in place.   Return in about 6 months (around 09/15/2021).  No orders of the defined types were placed in this encounter.  Lab Orders  No laboratory test(s) ordered today    Diagnostics: None.   Medication List:  Current Outpatient Medications  Medication Sig Dispense Refill   cetirizine (ZYRTEC ALLERGY) 10 MG tablet Take 1 tablet (10 mg total) by mouth daily. 30 tablet 5   EPINEPHrine 0.3 mg/0.3 mL IJ SOAJ injection Inject 0.3 mg into the muscle as needed for anaphylaxis. 1 each 2   hydrOXYzine (ATARAX/VISTARIL) 10 MG tablet Take 1 tablet (10 mg  total) by mouth at bedtime as needed for itching. 30 tablet 3   mometasone (ELOCON) 0.1 % ointment Apply topically 2 (two) times daily as needed (tough, eczema spots). Do not use on the face, neck, armpits or groin area. Do not use more than 3 weeks in a row. 45 g 2   triamcinolone ointment (KENALOG) 0.1 % Apply 1  application topically 2 (two) times daily as needed (rash flare). Do not use on the face, neck, armpits or groin area. Do not use more than 3 weeks in a row. 80 g 2   No current facility-administered medications for this visit.   Allergies: Allergies  Allergen Reactions   Shellfish Allergy Nausea And Vomiting   I reviewed her past medical history, social history, family history, and environmental history and no significant changes have been reported from her previous visit.  Review of Systems  Constitutional:  Negative for appetite change, chills, fever and unexpected weight change.  HENT:  Negative for congestion and rhinorrhea.   Eyes:  Negative for itching.  Respiratory:  Negative for cough, chest tightness, shortness of breath and wheezing.   Cardiovascular:  Negative for chest pain.  Gastrointestinal:  Negative for abdominal pain.  Genitourinary:  Negative for difficulty urinating.  Skin:  Positive for rash.  Allergic/Immunologic: Positive for environmental allergies and food allergies.  Neurological:  Negative for headaches.   Objective: BP 122/76   Pulse 66   Temp 97.6 F (36.4 C)   Resp 16   Ht 5\' 5"  (1.651 m)   Wt 24 lb (10.9 kg)   SpO2 98%   BMI 3.99 kg/m  Body mass index is 3.99 kg/m. Physical Exam Vitals and nursing note reviewed.  Constitutional:      Appearance: Normal appearance. She is well-developed.  HENT:     Head: Normocephalic and atraumatic.     Right Ear: Tympanic membrane and external ear normal.     Left Ear: Tympanic membrane and external ear normal.     Nose: Nose normal.     Mouth/Throat:     Mouth: Mucous membranes are moist.     Pharynx: Oropharynx is clear.  Eyes:     Conjunctiva/sclera: Conjunctivae normal.  Cardiovascular:     Rate and Rhythm: Normal rate and regular rhythm.     Heart sounds: Normal heart sounds. No murmur heard.   No friction rub. No gallop.  Pulmonary:     Effort: Pulmonary effort is normal.     Breath sounds:  Normal breath sounds. No wheezing, rhonchi or rales.  Musculoskeletal:     Cervical back: Neck supple.  Skin:    General: Skin is warm and dry.     Findings: Rash present.     Comments: Dry patches on the feet and hand.  Neurological:     Mental Status: She is alert and oriented to person, place, and time.  Psychiatric:        Behavior: Behavior normal.  Previous notes and tests were reviewed. The plan was reviewed with the patient/family, and all questions/concerned were addressed.  It was my pleasure to see Brooke Casey today and participate in her care. Please feel free to contact me with any questions or concerns.  Sincerely,  , DO Allergy & Immunology  [facility] office: [phone] [facility] [place] office: [phone]

## 2021-03-17 NOTE — Assessment & Plan Note (Signed)
Past history - Shellfish causes itching and nausea.  2022 skin testing showed: Positive to shrimp, crab, lobster, oyster, scallops.   . Continue to avoid shellfish and mollusks . For mild symptoms you can take over the counter antihistamines such as Benadryl and monitor symptoms closely. If symptoms worsen or if you have severe symptoms including breathing issues, throat closure, significant swelling, whole body hives, severe diarrhea and vomiting, lightheadedness then inject epinephrine and seek immediate medical care afterwards. . Action plan in place.

## 2021-03-17 NOTE — Assessment & Plan Note (Signed)
Past history - Eczema on and off throughout her life however started flaring in June 2022.  Interim history - improved with topical steroid creams. Not interested in Dupixent at this time. . See below for proper skin care - make sure you are using fragrance free and dye free products.  o Use a thick ointment or moisturizer for the winter months.  . Use triamcinolone 0.1% ointment twice a day as needed for rash flares. Do not use on the face, neck, armpits or groin area. Do not use more than 3 weeks in a row.  . Thick, stubborn areas (legs and feet): mometasone 0.1% ointment twice daily as needed to affected areas (up to 3 weeks in a row); stop when clear and can restart as needed for flares.  . Itching: . Take hydroxyzine 10mg  1 hour before bed as needed.

## 2021-03-17 NOTE — Patient Instructions (Addendum)
Eczema: See below for proper skin care - make sure you are using fragrance free and dye free products.  Use a thick ointment or moisturizer for the winter months.  Use triamcinolone 0.1% ointment twice a day as needed for rash flares. Do not use on the face, neck, armpits or groin area. Do not use more than 3 weeks in a row.  Thick, stubborn areas (legs and feet): mometasone 0.1% ointment twice daily as needed to affected areas (up to 3 weeks in a row); stop when clear and can restart as needed for flares.  Itching: Take hydroxyzine 10mg  1 hour before bed as needed.   Environmental allergies 2022 skin testing: Positive to grass, weed pollen, ragweed, trees, mold, dust mites, cat, dog, cockroach.   Continue environmental control measures as below. Try Claritin (loratadine), Allegra (fexofenadine), or Xyzal (levocetirizine) daily as needed. May switch antihistamines every few months. If it still makes you drowsy, you can try to take 1/2 tablet instead of a full tablet.  Food allergy: 2022 skin testing: Positive to shrimp, crab, lobster, oyster, scallops.  Continue to avoid shellfish and mollusks For mild symptoms you can take over the counter antihistamines such as Benadryl and monitor symptoms closely. If symptoms worsen or if you have severe symptoms including breathing issues, throat closure, significant swelling, whole body hives, severe diarrhea and vomiting, lightheadedness then inject epinephrine and seek immediate medical care afterwards. Action plan in place.   Follow up in 6 months or sooner if needed.    Skin care recommendations  Bath time: Always use lukewarm water. AVOID very hot or cold water. Keep bathing time to 5-10 minutes. Do NOT use bubble bath. Use a mild soap and use just enough to wash the dirty areas. Do NOT scrub skin vigorously.  After bathing, pat dry your skin with a towel. Do NOT rub or scrub the skin.  Moisturizers and prescriptions:  ALWAYS apply  moisturizers immediately after bathing (within 3 minutes). This helps to lock-in moisture. Use the moisturizer several times a day over the whole body. Good summer moisturizers include: Aveeno, CeraVe, Cetaphil. Good winter moisturizers include: Aquaphor, Vaseline, Cerave, Cetaphil, Eucerin, Vanicream. When using moisturizers along with medications, the moisturizer should be applied about one hour after applying the medication to prevent diluting effect of the medication or moisturize around where you applied the medications. When not using medications, the moisturizer can be continued twice daily as maintenance.  Laundry and clothing: Avoid laundry products with added color or perfumes. Use unscented hypo-allergenic laundry products such as Tide free, Cheer free & gentle, and All free and clear.  If the skin still seems dry or sensitive, you can try double-rinsing the clothes. Avoid tight or scratchy clothing such as wool. Do not use fabric softeners or dyer sheets.

## 2021-03-17 NOTE — Assessment & Plan Note (Signed)
Past history - Rhinoconjunctivitis symptoms in the spring and fall.  2022 skin testing showed: Positive to grass, weed pollen, ragweed, trees, mold, dust mites, cat, dog, cockroach.  Interim history - zyrtec works but causes drowsiness even if takes at night.   Continue environmental control measures as below.  Try Claritin (loratadine), Allegra (fexofenadine), or Xyzal (levocetirizine) daily as needed. May switch antihistamines every few months.  If it still makes you drowsy, you can try to take 1/2 tablet instead of a full tablet.
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
## 2021-06-15 DIAGNOSIS — Z6841 Body Mass Index (BMI) 40.0 and over, adult: Secondary | ICD-10-CM | POA: Diagnosis not present

## 2021-06-15 DIAGNOSIS — N368 Other specified disorders of urethra: Secondary | ICD-10-CM | POA: Diagnosis not present

## 2021-06-15 DIAGNOSIS — R7989 Other specified abnormal findings of blood chemistry: Secondary | ICD-10-CM | POA: Diagnosis not present

## 2021-06-15 DIAGNOSIS — Z139 Encounter for screening, unspecified: Secondary | ICD-10-CM | POA: Diagnosis not present

## 2021-06-15 DIAGNOSIS — E559 Vitamin D deficiency, unspecified: Secondary | ICD-10-CM | POA: Diagnosis not present

## 2021-06-15 DIAGNOSIS — Z3046 Encounter for surveillance of implantable subdermal contraceptive: Secondary | ICD-10-CM | POA: Diagnosis not present

## 2021-06-15 DIAGNOSIS — Z01419 Encounter for gynecological examination (general) (routine) without abnormal findings: Secondary | ICD-10-CM | POA: Diagnosis not present

## 2021-06-15 DIAGNOSIS — Z0000 Encounter for general adult medical examination without abnormal findings: Secondary | ICD-10-CM | POA: Diagnosis not present

## 2021-06-15 DIAGNOSIS — R399 Unspecified symptoms and signs involving the genitourinary system: Secondary | ICD-10-CM | POA: Diagnosis not present

## 2021-07-31 DIAGNOSIS — N361 Urethral diverticulum: Secondary | ICD-10-CM | POA: Diagnosis not present

## 2021-08-05 ENCOUNTER — Other Ambulatory Visit: Payer: Self-pay | Admitting: Urology

## 2021-08-05 DIAGNOSIS — N361 Urethral diverticulum: Secondary | ICD-10-CM

## 2021-08-25 ENCOUNTER — Ambulatory Visit
Admission: RE | Admit: 2021-08-25 | Discharge: 2021-08-25 | Disposition: A | Discharge status: Home / Self Care | Payer: 59 | Source: Ambulatory Visit | Attending: Urology | Admitting: Urology

## 2021-08-25 ENCOUNTER — Other Ambulatory Visit: Payer: Self-pay

## 2021-08-25 DIAGNOSIS — N898 Other specified noninflammatory disorders of vagina: Secondary | ICD-10-CM | POA: Diagnosis not present

## 2021-08-25 DIAGNOSIS — N361 Urethral diverticulum: Secondary | ICD-10-CM

## 2021-08-25 MED ORDER — GADOBENATE DIMEGLUMINE 529 MG/ML IV SOLN
20.0000 mL | Freq: Once | INTRAVENOUS | Status: AC | PRN
  Administered 2021-08-25: 20 mL via INTRAVENOUS

## 2021-09-15 ENCOUNTER — Ambulatory Visit: Payer: 59 | Admitting: Allergy

## 2021-09-15 DIAGNOSIS — J309 Allergic rhinitis, unspecified: Secondary | ICD-10-CM

## 2021-09-15 NOTE — Progress Notes (Deleted)
Follow Up Note  RE: Brooke Casey MRN: [id number] DOB: [birth year] Date of Office Visit: 09/15/2021  Referring provider: Barbette Merino, NP Primary care provider: Barbette Merino, NP  Chief Complaint: No chief complaint on file.  History of Present Illness: I had the pleasure of seeing Brooke Casey for a follow up visit at the [facility] on 09/15/2021. She is a 31 y.o. female, who is being followed for atopic dermatitis, allergic rhinoconjunctivitis, food allergy. Her previous allergy office visit was on 03/17/2021 with Dr. Selena Batten. Today is a regular follow up visit.  Other atopic dermatitis Past history - Eczema on and off throughout her life however started flaring in June 2022.  Interim history - improved with topical steroid creams. Not interested in Dupixent at this time. See below for proper skin care - make sure you are using fragrance free and dye free products.  Use a thick ointment or moisturizer for the winter months.  Use triamcinolone 0.1% ointment twice a day as needed for rash flares. Do not use on the face, neck, armpits or groin area. Do not use more than 3 weeks in a row.  Thick, stubborn areas (legs and feet): mometasone 0.1% ointment twice daily as needed to affected areas (up to 3 weeks in a row); stop when clear and can restart as needed for flares.  Itching: Take hydroxyzine 10mg  1 hour before bed as needed.    Seasonal and perennial allergic rhinoconjunctivitis Past history - Rhinoconjunctivitis symptoms in the spring and fall.  2022 skin testing showed: Positive to grass, weed pollen, ragweed, trees, mold, dust mites, cat, dog, cockroach.  Interim history - zyrtec works but causes drowsiness even if takes at night.  Continue environmental control measures as below. Try Claritin (loratadine), Allegra (fexofenadine), or Xyzal (levocetirizine) daily as needed. May switch antihistamines every few months. If it still makes you drowsy, you can try to  take 1/2 tablet instead of a full tablet.   Food allergy Past history - Shellfish causes itching and nausea.  2022 skin testing showed: Positive to shrimp, crab, lobster, oyster, scallops.   Continue to avoid shellfish and mollusks For mild symptoms you can take over the counter antihistamines such as Benadryl and monitor symptoms closely. If symptoms worsen or if you have severe symptoms including breathing issues, throat closure, significant swelling, whole body hives, severe diarrhea and vomiting, lightheadedness then inject epinephrine and seek immediate medical care afterwards. Action plan in place.    Return in about 6 months (around 09/15/2021).  Assessment and Plan: Brooke Casey is a 31 y.o. female with: No problem-specific Assessment & Plan notes found for this encounter.  No follow-ups on file.  No orders of the defined types were placed in this encounter.  Lab Orders  No laboratory test(s) ordered today    Diagnostics: Spirometry:  Tracings reviewed. Her effort: {Blank single:19197::"Good reproducible efforts.","It was hard to get consistent efforts and there is a question as to whether this reflects a maximal maneuver.","Poor effort, data can not be interpreted."} FVC: ***L FEV1: ***L, ***% predicted FEV1/FVC ratio: ***% Interpretation: {Blank single:19197::"Spirometry consistent with mild obstructive disease","Spirometry consistent with moderate obstructive disease","Spirometry consistent with severe obstructive disease","Spirometry consistent with possible restrictive disease","Spirometry consistent with mixed obstructive and restrictive disease","Spirometry uninterpretable due to technique","Spirometry consistent with normal pattern","No overt abnormalities noted given today's efforts"}.  Please see scanned spirometry results for details.  Skin Testing: {Blank single:19197::"Select foods","Environmental allergy panel","Environmental allergy panel and select foods","Food allergy  panel","None","Deferred due to  recent antihistamines use"}. *** Results discussed with patient/family.   Medication List:  Current Outpatient Medications  Medication Sig Dispense Refill   cetirizine (ZYRTEC ALLERGY) 10 MG tablet Take 1 tablet (10 mg total) by mouth daily. 30 tablet 5   EPINEPHrine 0.3 mg/0.3 mL IJ SOAJ injection Inject 0.3 mg into the muscle as needed for anaphylaxis. 1 each 2   hydrOXYzine (ATARAX/VISTARIL) 10 MG tablet Take 1 tablet (10 mg total) by mouth at bedtime as needed for itching. 30 tablet 3   mometasone (ELOCON) 0.1 % ointment Apply topically 2 (two) times daily as needed (tough, eczema spots). Do not use on the face, neck, armpits or groin area. Do not use more than 3 weeks in a row. 45 g 2   triamcinolone ointment (KENALOG) 0.1 % Apply 1 application topically 2 (two) times daily as needed (rash flare). Do not use on the face, neck, armpits or groin area. Do not use more than 3 weeks in a row. 80 g 2   No current facility-administered medications for this visit.   Allergies: Allergies  Allergen Reactions   Shellfish Allergy Nausea And Vomiting   I reviewed her past medical history, social history, family history, and environmental history and no significant changes have been reported from her previous visit.  Review of Systems  Constitutional:  Negative for appetite change, chills, fever and unexpected weight change.  HENT:  Negative for congestion and rhinorrhea.   Eyes:  Negative for itching.  Respiratory:  Negative for cough, chest tightness, shortness of breath and wheezing.   Cardiovascular:  Negative for chest pain.  Gastrointestinal:  Negative for abdominal pain.  Genitourinary:  Negative for difficulty urinating.  Skin:  Positive for rash.  Allergic/Immunologic: Positive for environmental allergies and food allergies.  Neurological:  Negative for headaches.   Objective: There were no vitals taken for this visit. There is no height or weight  on file to calculate BMI. Physical Exam Vitals and nursing note reviewed.  Constitutional:      Appearance: Normal appearance. She is well-developed.  HENT:     Head: Normocephalic and atraumatic.     Right Ear: Tympanic membrane and external ear normal.     Left Ear: Tympanic membrane and external ear normal.     Nose: Nose normal.     Mouth/Throat:     Mouth: Mucous membranes are moist.     Pharynx: Oropharynx is clear.  Eyes:     Conjunctiva/sclera: Conjunctivae normal.  Cardiovascular:     Rate and Rhythm: Normal rate and regular rhythm.     Heart sounds: Normal heart sounds. No murmur heard.   No friction rub. No gallop.  Pulmonary:     Effort: Pulmonary effort is normal.     Breath sounds: Normal breath sounds. No wheezing, rhonchi or rales.  Musculoskeletal:     Cervical back: Neck supple.  Skin:    General: Skin is warm and dry.     Findings: Rash present.     Comments: Dry patches on the feet and hand.  Neurological:     Mental Status: She is alert and oriented to person, place, and time.  Psychiatric:        Behavior: Behavior normal.   Previous notes and tests were reviewed. The plan was reviewed with the patient/family, and all questions/concerned were addressed.  It was my pleasure to see Brooke Casey today and participate in her care. Please feel free to contact me with any questions or concerns.  Sincerely,  Rexene Alberts, DO Allergy & Immunology  [facility] office: [place] office: [phone]

## 2021-09-22 DIAGNOSIS — Z3046 Encounter for surveillance of implantable subdermal contraceptive: Secondary | ICD-10-CM | POA: Diagnosis not present

## 2021-10-07 NOTE — Progress Notes (Signed)
? ?Follow Up Note ? ?RE: Brooke Casey MRN: [id number] DOB: 11-Oct-1990 ?Date of Office Visit: 10/08/2021 ? ?Referring provider: Barbette Merino, NP ?Primary care provider: Barbette Merino, NP ? ?Chief Complaint: Nasal Congestion ? ?History of Present Illness: ?I had the pleasure of seeing Brooke Casey for a follow up visit at the [facility] on 10/08/2021. She is a 31 y.o. female, who is being followed for atopic dermatitis, allergic rhinoconjunctivitis and food allergy. Her previous allergy office visit was on 03/17/2021 with Dr. Selena Batten. Today is a regular follow up visit. ? ?Atopic dermatitis ?Doing much better with moisturizers only. ?  ?Seasonal and perennial allergic rhinoconjunctivitis ?Patient has been having issues with nasal congestion and wheezing at times. No prior history of asthma/inhaler use. ? ?Currently taking allegra once a day with no benefit and benadryl at night. ?Did not try any nasal sprays yet. ? ?Having some itchy eyes. No contact lens use.  ? ?Patient got 1 dog in December 2023 and noted contact hives.  ?Mainly outdoors now but does come inside at times.  ? ?Food allergy ?Avoiding shellfish and mollusks. ?No reactions. ? ?Had questions about food sensitivity testing - not recommended from allergy perspective. ?  ?Assessment and Plan: ?Mikalyn is a 31 y.o. female with: ?Seasonal and perennial allergic rhinoconjunctivitis ?Past history - Rhinoconjunctivitis symptoms in the spring and fall.  2022 skin testing showed: Positive to grass, weed pollen, ragweed, trees, mold, dust mites, cat, dog, cockroach. Zyrtec caused drowsiness. ?Interim history - Allegra ineffective. Got 1 dog - contact hives, mainly outdoors now. Symptoms flaring.  ?Continue environmental control measures. ?Start Singulair (montelukast) 10mg  daily at night. ?Cautioned that in some children/adults can experience behavioral changes including hyperactivity, agitation, depression, sleep disturbances and suicidal  ideations. These side effects are rare, but if you notice them you should notify me and discontinue Singulair (montelukast). ?Start Ryaltris (olopatadine + mometasone nasal spray combination) 1-2 sprays per nostril twice a day. Sample given. ?This will be mailed to you.  ?Nasal saline spray (i.e., Simply Saline) or nasal saline lavage (i.e., NeilMed) is recommended as needed and prior to medicated nasal sprays. ?Use over the counter antihistamines such as Zyrtec (cetirizine), Claritin (loratadine), Allegra (fexofenadine), or Xyzal (levocetirizine) daily as needed. May take twice a day during allergy flares. May switch antihistamines every few months. ?Use olopatadine eye drops 0.7% once a day as needed for itchy/watery eyes. Sample given. ?Consider allergy injections for long term control if above medications do not help the symptoms - handout given.  ? ?Wheezing ?No prior asthma diagnosis or inhaler use. Noted wheezing at times this spring. ?Today's spirometry was normal. ?May use albuterol rescue inhaler 2 puffs every 4 to 6 hours as needed for shortness of breath, chest tightness, coughing, and wheezing. Monitor frequency of use.  ?Demonstrated proper use.  ? ?Food allergy ?Past history - Shellfish causes itching and nausea.  2022 skin testing showed: Positive to shrimp, crab, lobster, oyster, scallops.   ?Interim history - no reactions.  ?Continue to avoid shellfish and mollusks ?For mild symptoms you can take over the counter antihistamines such as Benadryl and monitor symptoms closely. If symptoms worsen or if you have severe symptoms including breathing issues, throat closure, significant swelling, whole body hives, severe diarrhea and vomiting, lightheadedness then inject epinephrine and seek immediate medical care afterwards. ?Action plan in place.  ?Discussed ALCAT food sensitivity testing. This usually measures IgG level to foods which means that patient was exposed to that particular  food in the past -  no clinical significance. This type of testing is not recommend from allergy perspective.  ? ?Return in about 3 months (around 01/08/2022). ? ?Meds ordered this encounter  ?Medications  ? albuterol (VENTOLIN HFA) 108 (90 Base) MCG/ACT inhaler  ?  Sig: Inhale 2 puffs into the lungs every 4 (four) hours as needed for wheezing or shortness of breath (coughing fits).  ?  Dispense:  18 g  ?  Refill:  1  ? montelukast (SINGULAIR) 10 MG tablet  ?  Sig: Take 1 tablet (10 mg total) by mouth at bedtime.  ?  Dispense:  30 tablet  ?  Refill:  5  ? levocetirizine (XYZAL) 5 MG tablet  ?  Sig: Take 1 tablet (5 mg total) by mouth every evening.  ?  Dispense:  30 tablet  ?  Refill:  5  ? Olopatadine-Mometasone (RYALTRIS) [id number] MCG/ACT SUSP  ?  Sig: Place 1-2 sprays into the nose in the morning and at bedtime.  ?  Dispense:  29 g  ?  Refill:  5  ?  [phone]  ? ?Lab Orders  ?No laboratory test(s) ordered today  ? ? ?Diagnostics: ?Spirometry:  ?Tracings reviewed. Her effort: Good reproducible efforts. ?FVC: 2.62L ?FEV1: 2.17L, 93% predicted ?FEV1/FVC ratio: 83% ?Interpretation: Spirometry consistent with normal pattern.  ?Please see scanned spirometry results for details. ? ?Medication List:  ?Current Outpatient Medications  ?Medication Sig Dispense Refill  ? albuterol (VENTOLIN HFA) 108 (90 Base) MCG/ACT inhaler Inhale 2 puffs into the lungs every 4 (four) hours as needed for wheezing or shortness of breath (coughing fits). 18 g 1  ? EPINEPHrine 0.3 mg/0.3 mL IJ SOAJ injection Inject 0.3 mg into the muscle as needed for anaphylaxis. 1 each 2  ? levocetirizine (XYZAL) 5 MG tablet Take 1 tablet (5 mg total) by mouth every evening. 30 tablet 5  ? montelukast (SINGULAIR) 10 MG tablet Take 1 tablet (10 mg total) by mouth at bedtime. 30 tablet 5  ? Olopatadine-Mometasone (RYALTRIS) [id number] MCG/ACT SUSP Place 1-2 sprays into the nose in the morning and at bedtime. 29 g 5  ? hydrOXYzine (ATARAX/VISTARIL) 10 MG tablet Take 1 tablet (10 mg  total) by mouth at bedtime as needed for itching. (Patient not taking: Reported on 10/08/2021) 30 tablet 3  ? mometasone (ELOCON) 0.1 % ointment Apply topically 2 (two) times daily as needed (tough, eczema spots). Do not use on the face, neck, armpits or groin area. Do not use more than 3 weeks in a row. (Patient not taking: Reported on 10/08/2021) 45 g 2  ? triamcinolone ointment (KENALOG) 0.1 % Apply 1 application topically 2 (two) times daily as needed (rash flare). Do not use on the face, neck, armpits or groin area. Do not use more than 3 weeks in a row. (Patient not taking: Reported on 10/08/2021) 80 g 2  ? ?No current facility-administered medications for this visit.  ? ?Allergies: ?Allergies  ?Allergen Reactions  ? Shellfish Allergy Nausea And Vomiting  ? ?I reviewed her past medical history, social history, family history, and environmental history and no significant changes have been reported from her previous visit. ? ?Review of Systems  ?Constitutional:  Negative for appetite change, chills, fever and unexpected weight change.  ?HENT:  Positive for congestion, postnasal drip, rhinorrhea and sneezing.   ?Eyes:  Positive for itching.  ?Respiratory:  Positive for cough and wheezing. Negative for chest tightness and shortness of breath.   ?Cardiovascular:  Negative for chest  pain.  ?Gastrointestinal:  Negative for abdominal pain.  ?Genitourinary:  Negative for difficulty urinating.  ?Skin:  Negative for rash.  ?Allergic/Immunologic: Positive for environmental allergies and food allergies.  ?Neurological:  Negative for headaches.  ? ?Objective: ?BP 102/72   Pulse 77   Temp 98.2 ?F (36.8 ?C) (Temporal)   Resp 16   SpO2 99%  ?There is no height or weight on file to calculate BMI. ?Physical Exam ?Vitals and nursing note reviewed.  ?Constitutional:   ?   Appearance: Normal appearance. She is well-developed.  ?HENT:  ?   Head: Normocephalic and atraumatic.  ?   Right Ear: Tympanic membrane and external ear  normal.  ?   Left Ear: Tympanic membrane and external ear normal.  ?   Nose: Congestion present.  ?   Mouth/Throat:  ?   Mouth: Mucous membranes are moist.  ?   Pharynx: Oropharynx is clear.  ?   Comments: [place]

## 2021-10-08 ENCOUNTER — Encounter: Payer: Self-pay | Admitting: Allergy

## 2021-10-08 ENCOUNTER — Ambulatory Visit (INDEPENDENT_AMBULATORY_CARE_PROVIDER_SITE_OTHER): Payer: 59 | Admitting: Allergy

## 2021-10-08 VITALS — BP 102/72 | HR 77 | Temp 98.2°F | Resp 16

## 2021-10-08 DIAGNOSIS — H1013 Acute atopic conjunctivitis, bilateral: Secondary | ICD-10-CM

## 2021-10-08 DIAGNOSIS — H1010 Acute atopic conjunctivitis, unspecified eye: Secondary | ICD-10-CM

## 2021-10-08 DIAGNOSIS — T781XXD Other adverse food reactions, not elsewhere classified, subsequent encounter: Secondary | ICD-10-CM | POA: Diagnosis not present

## 2021-10-08 DIAGNOSIS — R062 Wheezing: Secondary | ICD-10-CM | POA: Diagnosis not present

## 2021-10-08 DIAGNOSIS — L2089 Other atopic dermatitis: Secondary | ICD-10-CM | POA: Diagnosis not present

## 2021-10-08 DIAGNOSIS — J302 Other seasonal allergic rhinitis: Secondary | ICD-10-CM

## 2021-10-08 MED ORDER — LEVOCETIRIZINE DIHYDROCHLORIDE 5 MG PO TABS: 5.0000 mg | ORAL_TABLET | Freq: Every evening | ORAL | 5 refills | Status: DC

## 2021-10-08 MED ORDER — ALBUTEROL SULFATE HFA 108 (90 BASE) MCG/ACT IN AERS: 2.0000 | INHALATION_SPRAY | RESPIRATORY_TRACT | 1 refills | Status: DC | PRN

## 2021-10-08 MED ORDER — MONTELUKAST SODIUM 10 MG PO TABS: 10.0000 mg | ORAL_TABLET | Freq: Every day | ORAL | 5 refills | Status: DC

## 2021-10-08 MED ORDER — RYALTRIS 665-25 MCG/ACT NA SUSP: 1.0000 | Freq: Two times a day (BID) | NASAL | 5 refills | Status: DC

## 2021-10-08 NOTE — Assessment & Plan Note (Signed)
Past history - Shellfish causes itching and nausea.  2022 skin testing showed: Positive to shrimp, crab, lobster, oyster, scallops.   ?Interim history - no reactions.  ?? Continue to avoid shellfish and mollusks ?? For mild symptoms you can take over the counter antihistamines such as Benadryl and monitor symptoms closely. If symptoms worsen or if you have severe symptoms including breathing issues, throat closure, significant swelling, whole body hives, severe diarrhea and vomiting, lightheadedness then inject epinephrine and seek immediate medical care afterwards. ?? Action plan in place.  ?? Discussed ALCAT food sensitivity testing. This usually measures IgG level to foods which means that patient was exposed to that particular food in the past - no clinical significance. This type of testing is not recommend from allergy perspective.  ?

## 2021-10-08 NOTE — Assessment & Plan Note (Signed)
No prior asthma diagnosis or inhaler use. Noted wheezing at times this spring. ?? Today's spirometry was normal. ?? May use albuterol rescue inhaler 2 puffs every 4 to 6 hours as needed for shortness of breath, chest tightness, coughing, and wheezing. Monitor frequency of use.  ?? Demonstrated proper use.  ?

## 2021-10-08 NOTE — Patient Instructions (Addendum)
Breathing ?Normal breathing test today.  ?May use albuterol rescue inhaler 2 puffs every 4 to 6 hours as needed for shortness of breath, chest tightness, coughing, and wheezing. Monitor frequency of use.  ? ?Environmental allergies ?2022 skin testing: Positive to grass, weed pollen, ragweed, trees, mold, dust mites, cat, dog, cockroach.   ?Continue environmental control measures. ?Start Singulair (montelukast) 10mg  daily at night. ?Cautioned that in some children/adults can experience behavioral changes including hyperactivity, agitation, depression, sleep disturbances and suicidal ideations. These side effects are rare, but if you notice them you should notify me and discontinue Singulair (montelukast). ?Start Ryaltris (olopatadine + mometasone nasal spray combination) 1-2 sprays per nostril twice a day. Sample given. ?This will be mailed to you.  ?Nasal saline spray (i.e., Simply Saline) or nasal saline lavage (i.e., NeilMed) is recommended as needed and prior to medicated nasal sprays. ?Use over the counter antihistamines such as Zyrtec (cetirizine), Claritin (loratadine), Allegra (fexofenadine), or Xyzal (levocetirizine) daily as needed. May take twice a day during allergy flares. May switch antihistamines every few months. ?Use olopatadine eye drops 0.7% once a day as needed for itchy/watery eyes. Sample given. ? ?Food allergy: ?2022 skin testing: Positive to shrimp, crab, lobster, oyster, scallops.  ?Continue to avoid shellfish and mollusks ?For mild symptoms you can take over the counter antihistamines such as Benadryl and monitor symptoms closely. If symptoms worsen or if you have severe symptoms including breathing issues, throat closure, significant swelling, whole body hives, severe diarrhea and vomiting, lightheadedness then inject epinephrine and seek immediate medical care afterwards. ?Action plan in place.  ? ?Eczema: ?Continue proper skin care. ?Use triamcinolone 0.1% ointment twice a day as needed  for rash flares. Do not use on the face, neck, armpits or groin area. Do not use more than 3 weeks in a row.  ?Thick, stubborn areas (legs and feet): mometasone 0.1% ointment twice daily as needed to affected areas (up to 3 weeks in a row); stop when clear and can restart as needed for flares.  ? ?Follow up in 3 months or sooner if needed.   ? ?Skin care recommendations ? ?Bath time: ?Always use lukewarm water. AVOID very hot or cold water. ?Keep bathing time to 5-10 minutes. ?Do NOT use bubble bath. ?Use a mild soap and use just enough to wash the dirty areas. ?Do NOT scrub skin vigorously.  ?After bathing, pat dry your skin with a towel. Do NOT rub or scrub the skin. ? ?Moisturizers and prescriptions:  ?ALWAYS apply moisturizers immediately after bathing (within 3 minutes). This helps to lock-in moisture. ?Use the moisturizer several times a day over the whole body. ?Good summer moisturizers include: Aveeno, CeraVe, Cetaphil. ?Good winter moisturizers include: Aquaphor, Vaseline, Cerave, Cetaphil, Eucerin, Vanicream. ?When using moisturizers along with medications, the moisturizer should be applied about one hour after applying the medication to prevent diluting effect of the medication or moisturize around where you applied the medications. When not using medications, the moisturizer can be continued twice daily as maintenance. ? ?Laundry and clothing: ?Avoid laundry products with added color or perfumes. ?Use unscented hypo-allergenic laundry products such as Tide free, Cheer free & gentle, and All free and clear.  ?If the skin still seems dry or sensitive, you can try double-rinsing the clothes. ?Avoid tight or scratchy clothing such as wool. ?Do not use fabric softeners or dyer sheets. ?

## 2021-10-08 NOTE — Assessment & Plan Note (Addendum)
Past history - Rhinoconjunctivitis symptoms in the spring and fall.  2022 skin testing showed: Positive to grass, weed pollen, ragweed, trees, mold, dust mites, cat, dog, cockroach. Zyrtec caused drowsiness. ?Interim history - Allegra ineffective. Got 1 dog - contact hives, mainly outdoors now. Symptoms flaring.  ?? Continue environmental control measures. ?? Start Singulair (montelukast) 10mg  daily at night. ?? Cautioned that in some children/adults can experience behavioral changes including hyperactivity, agitation, depression, sleep disturbances and suicidal ideations. These side effects are rare, but if you notice them you should notify me and discontinue Singulair (montelukast). ?? Start Ryaltris (olopatadine + mometasone nasal spray combination) 1-2 sprays per nostril twice a day. Sample given. ?? This will be mailed to you.  ?? Nasal saline spray (i.e., Simply Saline) or nasal saline lavage (i.e., NeilMed) is recommended as needed and prior to medicated nasal sprays. ?? Use over the counter antihistamines such as Zyrtec (cetirizine), Claritin (loratadine), Allegra (fexofenadine), or Xyzal (levocetirizine) daily as needed. May take twice a day during allergy flares. May switch antihistamines every few months. ?? Use olopatadine eye drops 0.7% once a day as needed for itchy/watery eyes. Sample given. ?? Consider allergy injections for long term control if above medications do not help the symptoms - handout given.  ?

## 2021-10-13 DIAGNOSIS — Z113 Encounter for screening for infections with a predominantly sexual mode of transmission: Secondary | ICD-10-CM | POA: Diagnosis not present

## 2021-10-13 DIAGNOSIS — Q506 Other congenital malformations of fallopian tube and broad ligament: Secondary | ICD-10-CM | POA: Diagnosis not present

## 2021-10-13 DIAGNOSIS — Z3040 Encounter for surveillance of contraceptives, unspecified: Secondary | ICD-10-CM | POA: Diagnosis not present

## 2021-10-13 DIAGNOSIS — E559 Vitamin D deficiency, unspecified: Secondary | ICD-10-CM | POA: Diagnosis not present

## 2021-10-13 DIAGNOSIS — Z09 Encounter for follow-up examination after completed treatment for conditions other than malignant neoplasm: Secondary | ICD-10-CM | POA: Diagnosis not present

## 2021-11-10 DIAGNOSIS — Z30430 Encounter for insertion of intrauterine contraceptive device: Secondary | ICD-10-CM | POA: Diagnosis not present

## 2021-12-14 DIAGNOSIS — Z0289 Encounter for other administrative examinations: Secondary | ICD-10-CM

## 2021-12-16 DIAGNOSIS — N920 Excessive and frequent menstruation with regular cycle: Secondary | ICD-10-CM | POA: Diagnosis not present

## 2021-12-16 DIAGNOSIS — Z30431 Encounter for routine checking of intrauterine contraceptive device: Secondary | ICD-10-CM | POA: Diagnosis not present

## 2021-12-16 DIAGNOSIS — N760 Acute vaginitis: Secondary | ICD-10-CM | POA: Diagnosis not present

## 2021-12-16 DIAGNOSIS — R102 Pelvic and perineal pain: Secondary | ICD-10-CM | POA: Diagnosis not present

## 2022-01-05 ENCOUNTER — Encounter (INDEPENDENT_AMBULATORY_CARE_PROVIDER_SITE_OTHER): Payer: Self-pay | Admitting: Family Medicine

## 2022-01-05 ENCOUNTER — Ambulatory Visit (INDEPENDENT_AMBULATORY_CARE_PROVIDER_SITE_OTHER): Payer: 59 | Admitting: Family Medicine

## 2022-01-05 VITALS — BP 118/87 | HR 73 | Temp 98.4°F | Ht 65.0 in | Wt 255.0 lb

## 2022-01-05 DIAGNOSIS — R5383 Other fatigue: Secondary | ICD-10-CM

## 2022-01-05 DIAGNOSIS — M549 Dorsalgia, unspecified: Secondary | ICD-10-CM | POA: Diagnosis not present

## 2022-01-05 DIAGNOSIS — E669 Obesity, unspecified: Secondary | ICD-10-CM

## 2022-01-05 DIAGNOSIS — E559 Vitamin D deficiency, unspecified: Secondary | ICD-10-CM

## 2022-01-05 DIAGNOSIS — G8929 Other chronic pain: Secondary | ICD-10-CM

## 2022-01-05 DIAGNOSIS — R0602 Shortness of breath: Secondary | ICD-10-CM

## 2022-01-05 DIAGNOSIS — E7849 Other hyperlipidemia: Secondary | ICD-10-CM | POA: Diagnosis not present

## 2022-01-05 DIAGNOSIS — F3289 Other specified depressive episodes: Secondary | ICD-10-CM

## 2022-01-05 DIAGNOSIS — E66813 Obesity, class 3: Secondary | ICD-10-CM

## 2022-01-05 DIAGNOSIS — Z72820 Sleep deprivation: Secondary | ICD-10-CM | POA: Diagnosis not present

## 2022-01-05 DIAGNOSIS — Z6841 Body Mass Index (BMI) 40.0 and over, adult: Secondary | ICD-10-CM

## 2022-01-06 ENCOUNTER — Encounter (INDEPENDENT_AMBULATORY_CARE_PROVIDER_SITE_OTHER): Payer: Self-pay

## 2022-01-06 ENCOUNTER — Encounter (INDEPENDENT_AMBULATORY_CARE_PROVIDER_SITE_OTHER): Payer: Self-pay | Admitting: Family Medicine

## 2022-01-06 DIAGNOSIS — E785 Hyperlipidemia, unspecified: Secondary | ICD-10-CM | POA: Insufficient documentation

## 2022-01-12 LAB — INSULIN, FREE AND TOTAL
Free Insulin: 6 uU/mL
Total Insulin: 6 uU/mL

## 2022-01-12 LAB — LIPID PANEL
Chol/HDL Ratio: 4 ratio (ref 0.0–4.4)
Cholesterol, Total: 215 mg/dL — ABNORMAL HIGH (ref 100–199)
HDL: 54 mg/dL (ref >39)
LDL Chol Calc (NIH): 146 mg/dL — ABNORMAL HIGH (ref 0–99)
Triglycerides: 84 mg/dL (ref 0–149)
VLDL Cholesterol Cal: 15 mg/dL (ref 5–40)

## 2022-01-12 LAB — COMPREHENSIVE METABOLIC PANEL
ALT: 31 IU/L (ref 0–32)
AST: 23 IU/L (ref 0–40)
Albumin/Globulin Ratio: 1.3 (ref 1.2–2.2)
Albumin: 4.2 g/dL (ref 4.0–5.0)
Alkaline Phosphatase: 78 IU/L (ref 44–121)
BUN/Creatinine Ratio: 9 (ref 9–23)
BUN: 7 mg/dL (ref 6–20)
Bilirubin Total: 0.5 mg/dL (ref 0.0–1.2)
CO2: 21 mmol/L (ref 20–29)
Calcium: 9.3 mg/dL (ref 8.7–10.2)
Chloride: 101 mmol/L (ref 96–106)
Creatinine, Ser: 0.74 mg/dL (ref 0.57–1.00)
Globulin, Total: 3.3 g/dL (ref 1.5–4.5)
Glucose: 72 mg/dL (ref 70–99)
Potassium: 4 mmol/L (ref 3.5–5.2)
Sodium: 138 mmol/L (ref 134–144)
Total Protein: 7.5 g/dL (ref 6.0–8.5)
eGFR: 112 mL/min/{1.73_m2} (ref >59)

## 2022-01-12 LAB — CBC
Hematocrit: 38.8 % (ref 34.0–46.6)
Hemoglobin: 13.2 g/dL (ref 11.1–15.9)
MCH: 31.2 pg (ref 26.6–33.0)
MCHC: 34 g/dL (ref 31.5–35.7)
MCV: 92 fL (ref 79–97)
Platelets: 276 10*3/uL (ref 150–450)
RBC: 4.23 x10E6/uL (ref 3.77–5.28)
RDW: 13 % (ref 11.7–15.4)
WBC: 4.9 10*3/uL (ref 3.4–10.8)

## 2022-01-12 LAB — VITAMIN B12: Vitamin B-12: 530 pg/mL (ref 232–1245)

## 2022-01-12 LAB — HEMOGLOBIN A1C
Est. average glucose Bld gHb Est-mCnc: 103 mg/dL
Hgb A1c MFr Bld: 5.2 % (ref 4.8–5.6)

## 2022-01-12 LAB — TSH+FREE T4
Free T4: 0.96 ng/dL (ref 0.82–1.77)
TSH: 1.16 u[IU]/mL (ref 0.450–4.500)

## 2022-01-12 LAB — VITAMIN D 25 HYDROXY (VIT D DEFICIENCY, FRACTURES): Vit D, 25-Hydroxy: 29.8 ng/mL — ABNORMAL LOW (ref 30.0–100.0)

## 2022-01-12 LAB — FERRITIN: Ferritin: 49 ng/mL (ref 15–150)

## 2022-01-12 NOTE — Progress Notes (Signed)
Chief Complaint:   OBESITY Brooke Casey (MR# [id number]) is a 31 y.o. female who presents for evaluation and treatment of obesity and related comorbidities. Current BMI is Body mass index is 42.43 kg/m. Brooke Casey has been struggling with her weight for many years and has been unsuccessful in either losing weight, maintaining weight loss, or reaching her healthy weight goal.  Brooke Casey has been overweight since middle school.  Her mom and dad are overweight.  She was 205 pounds prior to both pregnancies.  Was trying to lose weight even then.  Has never used AOM.    Has 2 young kids and just finished school.  Her husband is supportive.  Works 2 jobs.  [name] is currently in the action stage of change and ready to dedicate time achieving and maintaining a healthier weight. Brooke Casey is interested in becoming our patient and working on intensive lifestyle modifications including (but not limited to) diet and exercise for weight loss.  Brooke Casey's habits were reviewed today and are as follows: Her family eats meals together, she thinks her family will eat healthier with her, her desired weight loss is 80 lbs, she has been heavy most of her life, she started gaining weight at 17 and 26, her heaviest weight ever was 257 pounds, she is a picky eater and doesn't like to eat healthier foods, she has significant food cravings issues, she snacks frequently in the evenings, she skips meals frequently, she is frequently drinking liquids with calories, she frequently makes poor food choices, she frequently eats larger portions than normal, and she struggles with emotional eating.  Depression Screen Brooke Casey's Food and Mood (modified PHQ-9) score was 9.     01/05/2022    9:01 AM  Depression screen PHQ 2/9  Decreased Interest 1  Down, Depressed, Hopeless 1  PHQ - 2 Score 2  Altered sleeping 2  Tired, decreased energy 1  Change in appetite 1  Feeling bad or failure about yourself  1  Trouble concentrating 1  Moving  slowly or fidgety/restless 1  Suicidal thoughts 0  PHQ-9 Score 9  Difficult doing work/chores Somewhat difficult   Subjective:   1. Other fatigue Brooke Casey admits to daytime somnolence and admits to waking up still tired. Patient has a history of symptoms of daytime fatigue and morning fatigue. Brooke Casey generally gets 5 hours of sleep per night, and states that she has nightime awakenings. Snoring is present. Apneic episodes are not present. Epworth Sleepiness Score is 13. Heavier and long menses with new ParaGard IUD. Lacking sleep.   2. SOB (shortness of breath) Brooke Casey notes increasing shortness of breath with exercising and seems to be worsening over time with weight gain. She notes getting out of breath sooner with activity than she used to. This has not gotten worse recently. [name] denies shortness of breath at rest or orthopnea.  3. Other hyperlipidemia Brooke Casey not on medications, and labs are not available for review.  4. Chronic back pain, unspecified back location, unspecified back pain laterality Brooke Casey is able to do light moderate exercise.  5. Vitamin D deficiency Brooke Casey is on OTC vitamin D 1000 units daily.  She complains of fatigue.  6. Poor sleep Brooke Casey's Epworth score is 13.  She gets 5 hours of sleep per night with daytime somnolence.  7. Other depression, with emtional eating Brooke Casey's PHQ-9 score is 9.  She denies the use of mood medications, and has a good support system.  Assessment/Plan:   1. Other fatigue Brooke Casey does feel  that her weight is causing her energy to be lower than it should be. Fatigue may be related to obesity, depression or many other causes. Labs will be ordered, and in the meanwhile, Brooke Casey will focus on self care including making healthy food choices, increasing physical activity and focusing on stress reduction.  - EKG 12-Lead - Comprehensive metabolic panel - VITAMIN D 25 Hydroxy (Vit-D Deficiency, Fractures) - Lipid panel - TSH + free T4 - Vitamin  B12 - CBC - Ferritin - Insulin, Free and Total - Hemoglobin A1c  2. SOB (shortness of breath) Brooke Casey does feel that she gets out of breath more easily that she used to when she exercises. Brooke Casey's shortness of breath appears to be obesity related and exercise induced. She has agreed to work on weight loss and gradually increase exercise to treat her exercise induced shortness of breath. Will continue to monitor closely.  3. Other hyperlipidemia We will check labs today, and [name] will work on her heart healthy diet.  4. Chronic back pain, unspecified back location, unspecified back pain laterality Brooke Casey is to look for improvement in back pain with weight loss.   5. Vitamin D deficiency We will check labs today, and we will follow-up at [name]'s next visit.   6. Poor sleep Brooke Casey is to consider getting a polysomnogram.   7. Other depression, with emtional eating Brooke Casey will consider the use of Wellbutrin and cognitive behavioral therapy.  8. Obesity, current BMI 42.5 Brooke Casey is currently in the action stage of change and her goal is to continue with weight loss efforts. I recommend Brooke Casey begin the structured treatment plan as follows:  She has agreed to the Category 2 Plan.  Exercise goals: She has 2 gym memberships.  Behavioral modification strategies: increasing lean protein intake, increasing water intake, decreasing liquid calories, no skipping meals, better snacking choices, and planning for success.  She was informed of the importance of frequent follow-up visits to maximize her success with intensive lifestyle modifications for her multiple health conditions. She was informed we would discuss her lab results at her next visit unless there is a critical issue that needs to be addressed sooner. [name] agreed to keep her next visit at the agreed upon time to discuss these results.  Objective:   Blood pressure 118/87, pulse 73, temperature 98.4 F (36.9 C), height 5\' 5"  (1.651 m),  weight 255 lb (115.7 kg), SpO2 100 %, unknown if currently breastfeeding. Body mass index is 42.43 kg/m.  EKG: Normal sinus rhythm, rate 70 BPM.  Indirect Calorimeter completed today shows a VO2 of 270 and a REE of 1858.  Her calculated basal metabolic rate is [id number] thus her basal metabolic rate is worse than expected.  General: Cooperative, alert, well developed, in no acute distress. HEENT: Conjunctivae and lids unremarkable. Cardiovascular: Regular rhythm.  Lungs: Normal work of breathing. Neurologic: No focal deficits.   Lab Results  Component Value Date   CREATININE 0.74 01/05/2022   BUN 7 01/05/2022   NA 138 01/05/2022   K 4.0 01/05/2022   CL 101 01/05/2022   CO2 21 01/05/2022   Lab Results  Component Value Date   ALT 31 01/05/2022   AST 23 01/05/2022   ALKPHOS 78 01/05/2022   BILITOT 0.5 01/05/2022   Lab Results  Component Value Date   HGBA1C 5.2 01/05/2022   HGBA1C 5.0 11/01/2019   HGBA1C 5.7 (H) 09/27/2019   No results found for: "INSULIN" Lab Results  Component Value Date   TSH 1.160 01/05/2022  Lab Results  Component Value Date   CHOL 215 (H) 01/05/2022   HDL 54 01/05/2022   LDLCALC 146 (H) 01/05/2022   TRIG 84 01/05/2022   CHOLHDL 4.0 01/05/2022   Lab Results  Component Value Date   WBC 4.9 01/05/2022   HGB 13.2 01/05/2022   HCT 38.8 01/05/2022   MCV 92 01/05/2022   PLT 276 01/05/2022   Lab Results  Component Value Date   FERRITIN 49 01/05/2022   Attestation Statements:   Reviewed by clinician on day of visit: allergies, medications, problem list, medical history, surgical history, family history, social history, and previous encounter notes.   Trude Mcburney, am acting as transcriptionist for Seymour Bars, DO.  I have reviewed the above documentation for accuracy and completeness, and I agree with the above. Glennis Brink, DO

## 2022-01-19 ENCOUNTER — Encounter (INDEPENDENT_AMBULATORY_CARE_PROVIDER_SITE_OTHER): Payer: Self-pay | Admitting: Family Medicine

## 2022-01-19 ENCOUNTER — Ambulatory Visit (INDEPENDENT_AMBULATORY_CARE_PROVIDER_SITE_OTHER): Payer: 59 | Admitting: Family Medicine

## 2022-01-19 ENCOUNTER — Ambulatory Visit: Payer: 59 | Admitting: Allergy

## 2022-01-19 VITALS — BP 96/63 | HR 64 | Temp 98.1°F | Ht 65.0 in | Wt 249.0 lb

## 2022-01-19 DIAGNOSIS — E559 Vitamin D deficiency, unspecified: Secondary | ICD-10-CM | POA: Diagnosis not present

## 2022-01-19 DIAGNOSIS — E7849 Other hyperlipidemia: Secondary | ICD-10-CM | POA: Insufficient documentation

## 2022-01-19 DIAGNOSIS — Z6841 Body Mass Index (BMI) 40.0 and over, adult: Secondary | ICD-10-CM | POA: Diagnosis not present

## 2022-01-19 DIAGNOSIS — E669 Obesity, unspecified: Secondary | ICD-10-CM | POA: Diagnosis not present

## 2022-01-19 MED ORDER — VITAMIN D (ERGOCALCIFEROL) 1.25 MG (50000 UNIT) PO CAPS: 50000.0000 [IU] | ORAL_CAPSULE | ORAL | 0 refills | Status: DC

## 2022-01-24 NOTE — Progress Notes (Unsigned)
Chief Complaint:   OBESITY Brooke Casey is here to discuss her progress with her obesity treatment plan along with follow-up of her obesity related diagnoses. Brooke Casey is on the Category 2 Plan and states she is following her eating plan approximately 75% of the time. Brooke Casey states she is walking 5,200 steps daily 3 times per week.  Today's visit was #: 2 Starting weight: 255 lbs Starting date: 01/05/2022 Today's weight: 249 lbs Today's date: 01/19/2022 Total lbs lost to date: 6 lbs Total lbs lost since last in-office visit: 6 lbs  Interim History: doing better with eating on schedule, meal planning but  off track with travel for a concert.  Still having some hunger and cravings.  Has never been on AOM.  Plans to have IUD removed.  Started tracking steps.  Wants more variety at lunch. Has been snacking more.  Enjoys 2-3 hard seltzers 2-4 times per week.    Subjective:   1. Other hyperlipidemia Discussed labs with patient today. 01/05/2022, Total Cholesterol 215, LDL 146. Not on medication, numbers are improving.  2. Vitamin D deficiency New Diagnosis. Discussed labs with patient today. 01/05/2022, Vitamin D level 29.8, complains of fatigue.   Assessment/Plan:   1. Other hyperlipidemia Plan to recheck FLP in 3 months, continue heart healthy diet.   2. Vitamin D deficiency Begin - Vitamin D, Ergocalciferol, (DRISDOL) 1.25 MG (50000 UNIT) CAPS capsule; Take 1 capsule (50,000 Units total) by mouth every 7 (seven) days.  Dispense: 4 capsule; Refill: 0  3. Obesity, current BMI 41.5 Given option to log lunches 300-400cal with 20-30-grams of protein.  Consider [id number].  Start once available.    Brooke Casey is currently in the action stage of change. As such, her goal is to continue with weight loss efforts. She has agreed to the Category 2 Plan.   Exercise goals:  increase steps to > 8,000 steps per day.  Behavioral modification strategies: increasing lean protein intake, increasing water  intake, decreasing eating out, no skipping meals, keeping healthy foods in the home, and decreasing junk food.  Brooke Casey has agreed to follow-up with our clinic in 3 weeks. She was informed of the importance of frequent follow-up visits to maximize her success with intensive lifestyle modifications for her multiple health conditions.   Objective:   Blood pressure 96/63, pulse 64, temperature 98.1 F (36.7 C), height 5\' 5"  (1.651 m), weight 249 lb (112.9 kg), SpO2 97 %, unknown if currently breastfeeding. Body mass index is 41.44 kg/m.  General: Cooperative, alert, well developed, in no acute distress. HEENT: Conjunctivae and lids unremarkable. Cardiovascular: Regular rhythm.  Lungs: Normal work of breathing. Neurologic: No focal deficits.   Lab Results  Component Value Date   CREATININE 0.74 01/05/2022   BUN 7 01/05/2022   NA 138 01/05/2022   K 4.0 01/05/2022   CL 101 01/05/2022   CO2 21 01/05/2022   Lab Results  Component Value Date   ALT 31 01/05/2022   AST 23 01/05/2022   ALKPHOS 78 01/05/2022   BILITOT 0.5 01/05/2022   Lab Results  Component Value Date   HGBA1C 5.2 01/05/2022   HGBA1C 5.0 11/01/2019   HGBA1C 5.7 (H) 09/27/2019   No results found for: "INSULIN" Lab Results  Component Value Date   TSH 1.160 01/05/2022   Lab Results  Component Value Date   CHOL 215 (H) 01/05/2022   HDL 54 01/05/2022   LDLCALC 146 (H) 01/05/2022   TRIG 84 01/05/2022   CHOLHDL 4.0 01/05/2022  Lab Results  Component Value Date   VD25OH 29.8 (L) 01/05/2022   Lab Results  Component Value Date   WBC 4.9 01/05/2022   HGB 13.2 01/05/2022   HCT 38.8 01/05/2022   MCV 92 01/05/2022   PLT 276 01/05/2022   Lab Results  Component Value Date   FERRITIN 49 01/05/2022   Attestation Statements:   Reviewed by clinician on day of visit: allergies, medications, problem list, medical history, surgical history, family history, social history, and previous encounter notes.  Time spent  on visit including pre-visit chart review and post-visit care and charting was 40 minutes.   I, Malcolm Metro, am acting as Energy manager for Seymour Bars, DO.  I have reviewed the above documentation for accuracy and completeness, and I agree with the above. Glennis Brink, DO

## 2022-02-03 ENCOUNTER — Ambulatory Visit: Payer: 59 | Admitting: Allergy

## 2022-02-03 DIAGNOSIS — Z3040 Encounter for surveillance of contraceptives, unspecified: Secondary | ICD-10-CM | POA: Diagnosis not present

## 2022-02-03 DIAGNOSIS — Z30432 Encounter for removal of intrauterine contraceptive device: Secondary | ICD-10-CM | POA: Diagnosis not present

## 2022-02-03 DIAGNOSIS — N920 Excessive and frequent menstruation with regular cycle: Secondary | ICD-10-CM | POA: Diagnosis not present

## 2022-02-09 ENCOUNTER — Ambulatory Visit: Payer: 59 | Admitting: Allergy

## 2022-02-09 NOTE — Progress Notes (Deleted)
Follow Up Note  RE: Brooke Casey MRN: [id number] DOB: April 11, 1991 Date of Office Visit: 02/09/2022  Referring provider: Barbette Merino, NP Primary care provider: Barbette Merino, NP  Chief Complaint: No chief complaint on file.  History of Present Illness: I had the pleasure of seeing Brooke Casey for a follow up visit at the [facility] on 02/09/2022. She is a 31 y.o. female, who is being followed for allergic rhinoconjunctivitis, wheezing and food allergy. Her previous allergy office visit was on 10/08/2021 with Dr. Selena Batten. Today is a regular follow up visit.  Seasonal and perennial allergic rhinoconjunctivitis Past history - Rhinoconjunctivitis symptoms in the spring and fall.  2022 skin testing showed: Positive to grass, weed pollen, ragweed, trees, mold, dust mites, cat, dog, cockroach. Zyrtec caused drowsiness. Interim history - Allegra ineffective. Got 1 dog - contact hives, mainly outdoors now. Symptoms flaring.  Continue environmental control measures. Start Singulair (montelukast) 10mg  daily at night. Cautioned that in some children/adults can experience behavioral changes including hyperactivity, agitation, depression, sleep disturbances and suicidal ideations. These side effects are rare, but if you notice them you should notify me and discontinue Singulair (montelukast). Start Ryaltris (olopatadine + mometasone nasal spray combination) 1-2 sprays per nostril twice a day. Sample given. This will be mailed to you.  Nasal saline spray (i.e., Simply Saline) or nasal saline lavage (i.e., NeilMed) is recommended as needed and prior to medicated nasal sprays. Use over the counter antihistamines such as Zyrtec (cetirizine), Claritin (loratadine), Allegra (fexofenadine), or Xyzal (levocetirizine) daily as needed. May take twice a day during allergy flares. May switch antihistamines every few months. Use olopatadine eye drops 0.7% once a day as needed for itchy/watery  eyes. Sample given. Consider allergy injections for long term control if above medications do not help the symptoms - handout given.    Wheezing No prior asthma diagnosis or inhaler use. Noted wheezing at times this spring. Today's spirometry was normal. May use albuterol rescue inhaler 2 puffs every 4 to 6 hours as needed for shortness of breath, chest tightness, coughing, and wheezing. Monitor frequency of use.  Demonstrated proper use.    Food allergy Past history - Shellfish causes itching and nausea.  2022 skin testing showed: Positive to shrimp, crab, lobster, oyster, scallops.   Interim history - no reactions.  Continue to avoid shellfish and mollusks For mild symptoms you can take over the counter antihistamines such as Benadryl and monitor symptoms closely. If symptoms worsen or if you have severe symptoms including breathing issues, throat closure, significant swelling, whole body hives, severe diarrhea and vomiting, lightheadedness then inject epinephrine and seek immediate medical care afterwards. Action plan in place.  Discussed ALCAT food sensitivity testing. This usually measures IgG level to foods which means that patient was exposed to that particular food in the past - no clinical significance. This type of testing is not recommend from allergy perspective.    Return in about 3 months (around 01/08/2022).    Assessment and Plan: Brooke Casey is a 32 y.o. female with: No problem-specific Assessment & Plan notes found for this encounter.  No follow-ups on file.  No orders of the defined types were placed in this encounter.  Lab Orders  No laboratory test(s) ordered today    Diagnostics: Spirometry:  Tracings reviewed. Her effort: {Blank single:19197::"Good reproducible efforts.","It was hard to get consistent efforts and there is a question as to whether this reflects a maximal maneuver.","Poor effort, data can not be interpreted."} FVC: ***  L FEV1: ***L, ***%  predicted FEV1/FVC ratio: ***% Interpretation: {Blank single:19197::"Spirometry consistent with mild obstructive disease","Spirometry consistent with moderate obstructive disease","Spirometry consistent with severe obstructive disease","Spirometry consistent with possible restrictive disease","Spirometry consistent with mixed obstructive and restrictive disease","Spirometry uninterpretable due to technique","Spirometry consistent with normal pattern","No overt abnormalities noted given today's efforts"}.  Please see scanned spirometry results for details.  Skin Testing: {Blank single:19197::"Select foods","Environmental allergy panel","Environmental allergy panel and select foods","Food allergy panel","None","Deferred due to recent antihistamines use"}. *** Results discussed with patient/family.   Medication List:  Current Outpatient Medications  Medication Sig Dispense Refill  . albuterol (VENTOLIN HFA) 108 (90 Base) MCG/ACT inhaler Inhale 2 puffs into the lungs every 4 (four) hours as needed for wheezing or shortness of breath (coughing fits). 18 g 1  . cholecalciferol (VITAMIN D3) 25 MCG (1000 UNIT) tablet Take 1,000 Units by mouth daily.    Marland Kitchen EPINEPHrine 0.3 mg/0.3 mL IJ SOAJ injection Inject 0.3 mg into the muscle as needed for anaphylaxis. 1 each 2  . hydrOXYzine (ATARAX/VISTARIL) 10 MG tablet Take 1 tablet (10 mg total) by mouth at bedtime as needed for itching. 30 tablet 3  . levocetirizine (XYZAL) 5 MG tablet Take 1 tablet (5 mg total) by mouth every evening. 30 tablet 5  . mometasone (ELOCON) 0.1 % ointment Apply topically 2 (two) times daily as needed (tough, eczema spots). Do not use on the face, neck, armpits or groin area. Do not use more than 3 weeks in a row. 45 g 2  . mometasone (ELOCON) 0.1 % ointment Apply topically.    . montelukast (SINGULAIR) 10 MG tablet Take 1 tablet (10 mg total) by mouth at bedtime. 30 tablet 5  . Olopatadine-Mometasone (RYALTRIS) [id number] MCG/ACT SUSP  Place 1-2 sprays into the nose in the morning and at bedtime. 29 g 5  . paragard intrauterine copper IUD IUD Take 1 device by intrauterine route.    . triamcinolone ointment (KENALOG) 0.1 % Apply 1 application topically 2 (two) times daily as needed (rash flare). Do not use on the face, neck, armpits or groin area. Do not use more than 3 weeks in a row. 80 g 2  . triamcinolone ointment (KENALOG) 0.1 % Apply topically.    . valACYclovir (VALTREX) 1000 MG tablet valacyclovir 1 gram tablet    . Vitamin D, Ergocalciferol, (DRISDOL) 1.25 MG (50000 UNIT) CAPS capsule Take 1 capsule (50,000 Units total) by mouth every 7 (seven) days. 4 capsule 0   No current facility-administered medications for this visit.   Allergies: Allergies  Allergen Reactions  . Shellfish Allergy Nausea And Vomiting   I reviewed her past medical history, social history, family history, and environmental history and no significant changes have been reported from her previous visit.  Review of Systems  Constitutional:  Negative for appetite change, chills, fever and unexpected weight change.  HENT:  Positive for congestion, postnasal drip, rhinorrhea and sneezing.   Eyes:  Positive for itching.  Respiratory:  Positive for cough and wheezing. Negative for chest tightness and shortness of breath.   Cardiovascular:  Negative for chest pain.  Gastrointestinal:  Negative for abdominal pain.  Genitourinary:  Negative for difficulty urinating.  Skin:  Negative for rash.  Allergic/Immunologic: Positive for environmental allergies and food allergies.  Neurological:  Negative for headaches.   Objective: There were no vitals taken for this visit. There is no height or weight on file to calculate BMI. Physical Exam Vitals and nursing note reviewed.  Constitutional:  Appearance: Normal appearance. She is well-developed.  HENT:     Head: Normocephalic and atraumatic.     Right Ear: Tympanic membrane and external ear normal.      Left Ear: Tympanic membrane and external ear normal.     Nose: Congestion present.     Mouth/Throat:     Mouth: Mucous membranes are moist.     Pharynx: Oropharynx is clear.     Comments: cobblestoning Eyes:     Conjunctiva/sclera: Conjunctivae normal.  Cardiovascular:     Rate and Rhythm: Normal rate and regular rhythm.     Heart sounds: Normal heart sounds. No murmur heard.    No friction rub. No gallop.  Pulmonary:     Effort: Pulmonary effort is normal.     Breath sounds: Normal breath sounds. No wheezing, rhonchi or rales.  Musculoskeletal:     Cervical back: Neck supple.  Skin:    General: Skin is warm and dry.     Findings: No rash.     Comments: Dry hands - improved from last visit.  Neurological:     Mental Status: She is alert and oriented to person, place, and time.  Psychiatric:        Behavior: Behavior normal.  Previous notes and tests were reviewed. The plan was reviewed with the patient/family, and all questions/concerned were addressed.  It was my pleasure to see Brooke Casey today and participate in her care. Please feel free to contact me with any questions or concerns.  Sincerely,  Wyline Mood, DO Allergy & Immunology  [facility] office: [phone] [facility] office: [phone]

## 2022-02-11 ENCOUNTER — Ambulatory Visit (INDEPENDENT_AMBULATORY_CARE_PROVIDER_SITE_OTHER): Payer: 59 | Admitting: Family Medicine

## 2022-02-11 ENCOUNTER — Encounter (INDEPENDENT_AMBULATORY_CARE_PROVIDER_SITE_OTHER): Payer: Self-pay | Admitting: Family Medicine

## 2022-02-11 VITALS — BP 115/80 | HR 76 | Temp 97.9°F | Ht 65.0 in | Wt 247.0 lb

## 2022-02-11 DIAGNOSIS — E669 Obesity, unspecified: Secondary | ICD-10-CM

## 2022-02-11 DIAGNOSIS — E559 Vitamin D deficiency, unspecified: Secondary | ICD-10-CM | POA: Diagnosis not present

## 2022-02-11 DIAGNOSIS — Z6841 Body Mass Index (BMI) 40.0 and over, adult: Secondary | ICD-10-CM | POA: Diagnosis not present

## 2022-02-11 DIAGNOSIS — E7849 Other hyperlipidemia: Secondary | ICD-10-CM

## 2022-02-11 DIAGNOSIS — R632 Polyphagia: Secondary | ICD-10-CM

## 2022-02-11 MED ORDER — BUPROPION HCL ER (XL) 150 MG PO TB24: 150.0000 mg | ORAL_TABLET | Freq: Every day | ORAL | 0 refills | Status: DC

## 2022-02-11 MED ORDER — VITAMIN D (ERGOCALCIFEROL) 1.25 MG (50000 UNIT) PO CAPS: 50000.0000 [IU] | ORAL_CAPSULE | ORAL | 0 refills | Status: DC

## 2022-02-16 ENCOUNTER — Ambulatory Visit: Payer: 59 | Admitting: Allergy

## 2022-02-16 NOTE — Progress Notes (Deleted)
Follow Up Note  RE: Brooke Casey MRN: [id number] DOB: 11/20/1990 Date of Office Visit: 02/16/2022  Referring provider: Vevelyn Francois, NP Primary care provider: Vevelyn Francois, NP  Chief Complaint: No chief complaint on file.  History of Present Illness: I had the pleasure of seeing Brooke Casey for a follow up visit at the [facility] on 02/16/2022. She is a 31 y.o. female, who is being followed for allergic rhinoconjunctivitis, wheezing and food allergy. Her previous allergy office visit was on 10/08/2021 with Dr. Maudie Mercury. Today is a regular follow up visit.  Seasonal and perennial allergic rhinoconjunctivitis Past history - Rhinoconjunctivitis symptoms in the spring and fall.  2022 skin testing showed: Positive to grass, weed pollen, ragweed, trees, mold, dust mites, cat, dog, cockroach. Zyrtec caused drowsiness. Interim history - Allegra ineffective. Got 1 dog - contact hives, mainly outdoors now. Symptoms flaring.  Continue environmental control measures. Start Singulair (montelukast) 10mg  daily at night. Cautioned that in some children/adults can experience behavioral changes including hyperactivity, agitation, depression, sleep disturbances and suicidal ideations. These side effects are rare, but if you notice them you should notify me and discontinue Singulair (montelukast). Start Ryaltris (olopatadine + mometasone nasal spray combination) 1-2 sprays per nostril twice a day. Sample given. This will be mailed to you.  Nasal saline spray (i.e., Simply Saline) or nasal saline lavage (i.e., NeilMed) is recommended as needed and prior to medicated nasal sprays. Use over the counter antihistamines such as Zyrtec (cetirizine), Claritin (loratadine), Allegra (fexofenadine), or Xyzal (levocetirizine) daily as needed. May take twice a day during allergy flares. May switch antihistamines every few months. Use olopatadine eye drops 0.7% once a day as needed for itchy/watery  eyes. Sample given. Consider allergy injections for long term control if above medications do not help the symptoms - handout given.    Wheezing No prior asthma diagnosis or inhaler use. Noted wheezing at times this spring. Today's spirometry was normal. May use albuterol rescue inhaler 2 puffs every 4 to 6 hours as needed for shortness of breath, chest tightness, coughing, and wheezing. Monitor frequency of use.  Demonstrated proper use.    Food allergy Past history - Shellfish causes itching and nausea.  2022 skin testing showed: Positive to shrimp, crab, lobster, oyster, scallops.   Interim history - no reactions.  Continue to avoid shellfish and mollusks For mild symptoms you can take over the counter antihistamines such as Benadryl and monitor symptoms closely. If symptoms worsen or if you have severe symptoms including breathing issues, throat closure, significant swelling, whole body hives, severe diarrhea and vomiting, lightheadedness then inject epinephrine and seek immediate medical care afterwards. Action plan in place.  Discussed ALCAT food sensitivity testing. This usually measures IgG level to foods which means that patient was exposed to that particular food in the past - no clinical significance. This type of testing is not recommend from allergy perspective.    Return in about 3 months (around 01/08/2022).  Assessment and Plan: Brooke Casey is a 31 y.o. female with: No problem-specific Assessment & Plan notes found for this encounter.  No follow-ups on file.  No orders of the defined types were placed in this encounter.  Lab Orders  No laboratory test(s) ordered today    Diagnostics: Spirometry:  Tracings reviewed. Her effort: {Blank single:19197::"Good reproducible efforts.","It was hard to get consistent efforts and there is a question as to whether this reflects a maximal maneuver.","Poor effort, data can not be interpreted."} FVC: ***L FEV1: ***  L, ***%  predicted FEV1/FVC ratio: ***% Interpretation: {Blank single:19197::"Spirometry consistent with mild obstructive disease","Spirometry consistent with moderate obstructive disease","Spirometry consistent with severe obstructive disease","Spirometry consistent with possible restrictive disease","Spirometry consistent with mixed obstructive and restrictive disease","Spirometry uninterpretable due to technique","Spirometry consistent with normal pattern","No overt abnormalities noted given today's efforts"}.  Please see scanned spirometry results for details.  Skin Testing: {Blank single:19197::"Select foods","Environmental allergy panel","Environmental allergy panel and select foods","Food allergy panel","None","Deferred due to recent antihistamines use"}. *** Results discussed with patient/family.   Medication List:  Current Outpatient Medications  Medication Sig Dispense Refill  . albuterol (VENTOLIN HFA) 108 (90 Base) MCG/ACT inhaler Inhale 2 puffs into the lungs every 4 (four) hours as needed for wheezing or shortness of breath (coughing fits). 18 g 1  . buPROPion (WELLBUTRIN XL) 150 MG 24 hr tablet Take 1 tablet (150 mg total) by mouth daily. 30 tablet 0  . cholecalciferol (VITAMIN D3) 25 MCG (1000 UNIT) tablet Take 1,000 Units by mouth daily.    Marland Kitchen EPINEPHrine 0.3 mg/0.3 mL IJ SOAJ injection Inject 0.3 mg into the muscle as needed for anaphylaxis. 1 each 2  . hydrOXYzine (ATARAX/VISTARIL) 10 MG tablet Take 1 tablet (10 mg total) by mouth at bedtime as needed for itching. 30 tablet 3  . levocetirizine (XYZAL) 5 MG tablet Take 1 tablet (5 mg total) by mouth every evening. 30 tablet 5  . mometasone (ELOCON) 0.1 % ointment Apply topically 2 (two) times daily as needed (tough, eczema spots). Do not use on the face, neck, armpits or groin area. Do not use more than 3 weeks in a row. 45 g 2  . mometasone (ELOCON) 0.1 % ointment Apply topically.    . montelukast (SINGULAIR) 10 MG tablet Take 1 tablet  (10 mg total) by mouth at bedtime. 30 tablet 5  . Olopatadine-Mometasone (RYALTRIS) [id number] MCG/ACT SUSP Place 1-2 sprays into the nose in the morning and at bedtime. 29 g 5  . paragard intrauterine copper IUD IUD Take 1 device by intrauterine route.    . triamcinolone ointment (KENALOG) 0.1 % Apply 1 application topically 2 (two) times daily as needed (rash flare). Do not use on the face, neck, armpits or groin area. Do not use more than 3 weeks in a row. 80 g 2  . triamcinolone ointment (KENALOG) 0.1 % Apply topically.    . valACYclovir (VALTREX) 1000 MG tablet valacyclovir 1 gram tablet    . Vitamin D, Ergocalciferol, (DRISDOL) 1.25 MG (50000 UNIT) CAPS capsule Take 1 capsule (50,000 Units total) by mouth every 7 (seven) days. 5 capsule 0   No current facility-administered medications for this visit.   Allergies: Allergies  Allergen Reactions  . Shellfish Allergy Nausea And Vomiting   I reviewed her past medical history, social history, family history, and environmental history and no significant changes have been reported from her previous visit.  Review of Systems  Constitutional:  Negative for appetite change, chills, fever and unexpected weight change.  HENT:  Positive for congestion, postnasal drip, rhinorrhea and sneezing.   Eyes:  Positive for itching.  Respiratory:  Positive for cough and wheezing. Negative for chest tightness and shortness of breath.   Cardiovascular:  Negative for chest pain.  Gastrointestinal:  Negative for abdominal pain.  Genitourinary:  Negative for difficulty urinating.  Skin:  Negative for rash.  Allergic/Immunologic: Positive for environmental allergies and food allergies.  Neurological:  Negative for headaches.   Objective: There were no vitals taken for this visit. There is no height or  weight on file to calculate BMI. Physical Exam Vitals and nursing note reviewed.  Constitutional:      Appearance: Normal appearance. She is well-developed.   HENT:     Head: Normocephalic and atraumatic.     Right Ear: Tympanic membrane and external ear normal.     Left Ear: Tympanic membrane and external ear normal.     Nose: Congestion present.     Mouth/Throat:     Mouth: Mucous membranes are moist.     Pharynx: Oropharynx is clear.     Comments: cobblestoning Eyes:     Conjunctiva/sclera: Conjunctivae normal.  Cardiovascular:     Rate and Rhythm: Normal rate and regular rhythm.     Heart sounds: Normal heart sounds. No murmur heard.    No friction rub. No gallop.  Pulmonary:     Effort: Pulmonary effort is normal.     Breath sounds: Normal breath sounds. No wheezing, rhonchi or rales.  Musculoskeletal:     Cervical back: Neck supple.  Skin:    General: Skin is warm and dry.     Findings: No rash.     Comments: Dry hands - improved from last visit.  Neurological:     Mental Status: She is alert and oriented to person, place, and time.  Psychiatric:        Behavior: Behavior normal.  Previous notes and tests were reviewed. The plan was reviewed with the patient/family, and all questions/concerned were addressed.  It was my pleasure to see Brooke Casey today and participate in her care. Please feel free to contact me with any questions or concerns.  Sincerely,  Wyline Mood, DO Allergy & Immunology  [facility] office: [phone] [facility] office: [phone]

## 2022-02-16 NOTE — Progress Notes (Signed)
Chief Complaint:   OBESITY Brooke Casey is here to discuss her progress with her obesity treatment plan along with follow-up of her obesity related diagnoses. Brooke Casey is on the Category 2 Plan and states she is following her eating plan approximately 60% of the time. Brooke Casey states she is dancing and in the gym 30-45 minutes 2 times per week.  Today's visit was #: 3 Starting weight: 255 lbs Starting date: 01/05/2022 Today's weight: 247 lbs Today's date: 02/11/2022 Total lbs lost to date: 8 lbs Total lbs lost since last in-office visit: 2 lbs  Interim History: Skipping breakfast.  Works early in the morning. Has 2 jobs, mindful of portions.  Having some cravings and hunger.  Trouble planning dinners due to schedule.  Chose a [place] leg at the fair.  Has a birthday trip to [place] in 2 weeks.   Subjective:   1. Other hyperlipidemia LDL was elevated to 146 in August.  Not on any lipid lowering medications. Working on dietary changes.   2. Vitamin D deficiency She is currently taking prescription vitamin D 50,000 IU each week. She denies nausea, vomiting or muscle weakness. Last Vitamin D level 29.8.   3. Polyphagia Avoid meal skipping.  Lean protein and fiber every 4 hours.  Assessment/Plan:   1. Other hyperlipidemia Recheck FLP in 2-3 months.  Start statin if indicated.    2. Vitamin D deficiency Refill - Vitamin D, Ergocalciferol, (DRISDOL) 1.25 MG (50000 UNIT) CAPS capsule; Take 1 capsule (50,000 Units total) by mouth every 7 (seven) days.  Dispense: 5 capsule; Refill: 0  3. Polyphagia Begin -  buPROPion (WELLBUTRIN XL) 150 MG 24 hr tablet; Take 1 tablet (150 mg total) by mouth daily.  Dispense: 30 tablet; Refill: 0  4. Obesity,current BMI 41.2 1) Offered a protein shake instead of skipping breakfast.  Specific examples given. Handouts.  2) Protein snack grab and go options given (H/O). 3) easy dinner options discussed.   Brooke Casey is currently in the action stage of change. As  such, her goal is to continue with weight loss efforts. She has agreed to the Category 2 Plan.   Exercise goals: All adults should avoid inactivity. Some physical activity is better than none, and adults who participate in any amount of physical activity gain some health benefits.  Behavioral modification strategies: increasing lean protein intake, decreasing simple carbohydrates, increasing vegetables, increasing water intake, decreasing eating out, no skipping meals, meal planning and cooking strategies, better snacking choices, and decreasing junk food.  Brooke Casey has agreed to follow-up with our clinic in 4 weeks. She was informed of the importance of frequent follow-up visits to maximize her success with intensive lifestyle modifications for her multiple health conditions.   Objective:   Blood pressure 115/80, pulse 76, temperature 97.9 F (36.6 C), height 5\' 5"  (1.651 m), weight 247 lb (112 kg), SpO2 98 %, unknown if currently breastfeeding. Body mass index is 41.1 kg/m.  General: Cooperative, alert, well developed, in no acute distress. HEENT: Conjunctivae and lids unremarkable. Cardiovascular: Regular rhythm.  Lungs: Normal work of breathing. Neurologic: No focal deficits.   Lab Results  Component Value Date   CREATININE 0.74 01/05/2022   BUN 7 01/05/2022   NA 138 01/05/2022   K 4.0 01/05/2022   CL 101 01/05/2022   CO2 21 01/05/2022   Lab Results  Component Value Date   ALT 31 01/05/2022   AST 23 01/05/2022   ALKPHOS 78 01/05/2022   BILITOT 0.5 01/05/2022   Lab Results  Component Value Date   HGBA1C 5.2 01/05/2022   HGBA1C 5.0 11/01/2019   HGBA1C 5.7 (H) 09/27/2019   No results found for: "INSULIN" Lab Results  Component Value Date   TSH 1.160 01/05/2022   Lab Results  Component Value Date   CHOL 215 (H) 01/05/2022   HDL 54 01/05/2022   LDLCALC 146 (H) 01/05/2022   TRIG 84 01/05/2022   CHOLHDL 4.0 01/05/2022   Lab Results  Component Value Date   VD25OH  29.8 (L) 01/05/2022   Lab Results  Component Value Date   WBC 4.9 01/05/2022   HGB 13.2 01/05/2022   HCT 38.8 01/05/2022   MCV 92 01/05/2022   PLT 276 01/05/2022   Lab Results  Component Value Date   FERRITIN 49 01/05/2022    Attestation Statements:   Reviewed by clinician on day of visit: allergies, medications, problem list, medical history, surgical history, family history, social history, and previous encounter notes.  I, Davy Pique, am acting as Location manager for Loyal Gambler, DO.  I have reviewed the above documentation for accuracy and completeness, and I agree with the above. Dell Ponto, DO

## 2022-02-19 IMAGING — US US ABDOMEN LIMITED
1 series · 14 of 25 positions shown · non-contrast
Comparison: None.

CLINICAL DATA: 28-year-old female with abdominal LFTs.

EXAM:
ULTRASOUND ABDOMEN LIMITED RIGHT UPPER QUADRANT

[Series 1: us abdomen limited · 14 of 59 slices shown]
[im 1/59]
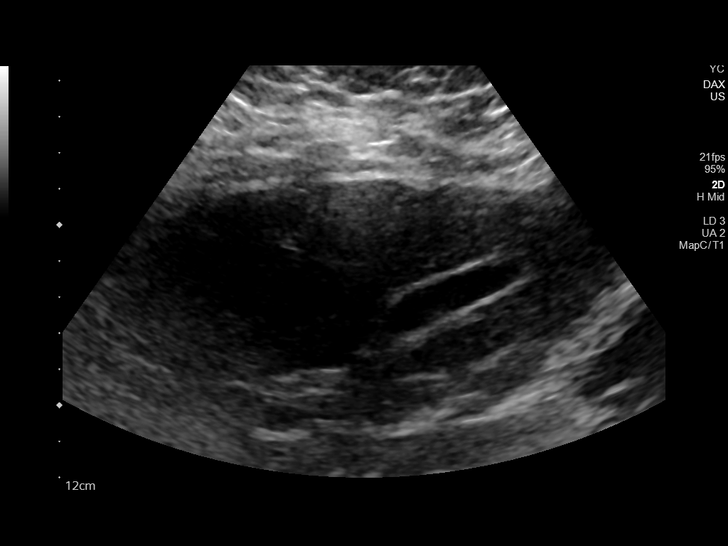
[im 5/59]
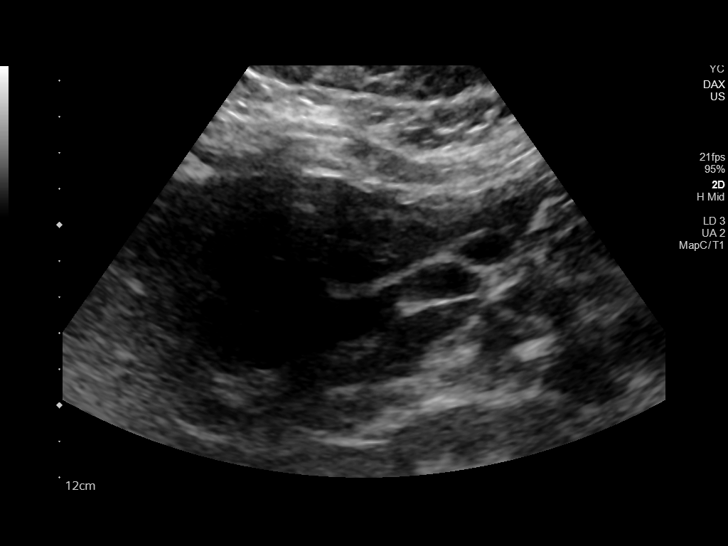
[im 10/59]
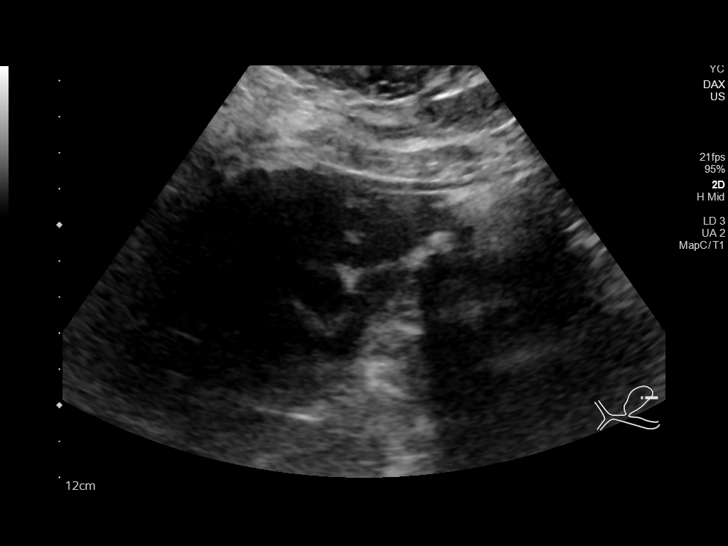
[im 15/59]
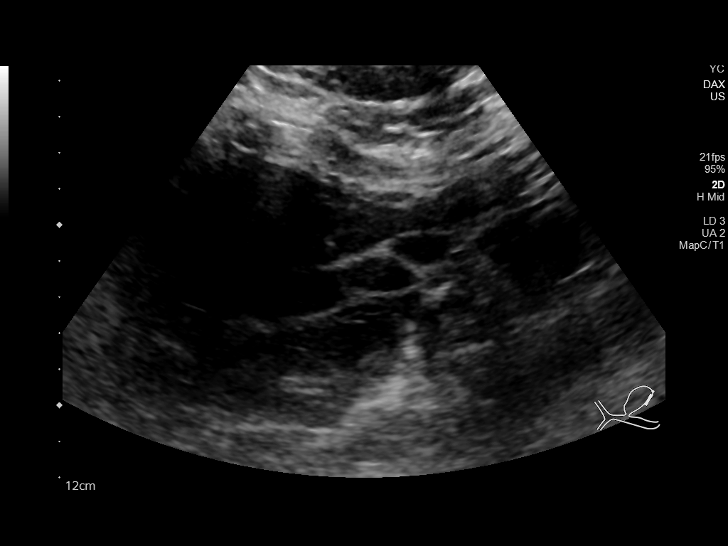
[im 20/59]
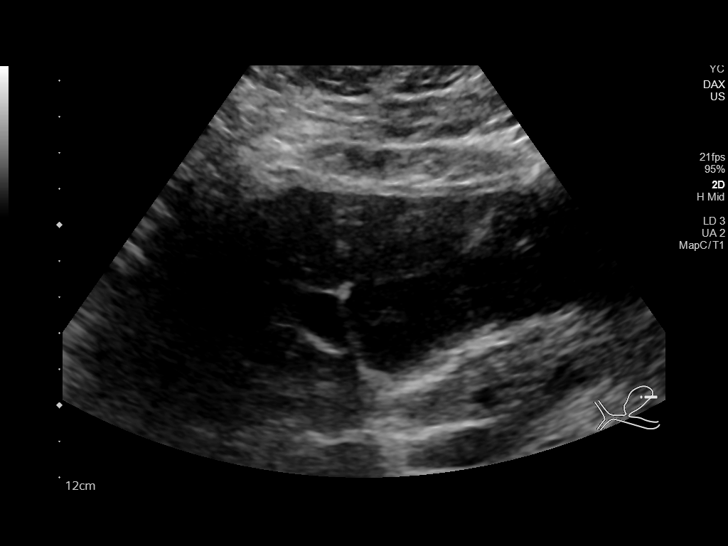
[im 22/59]
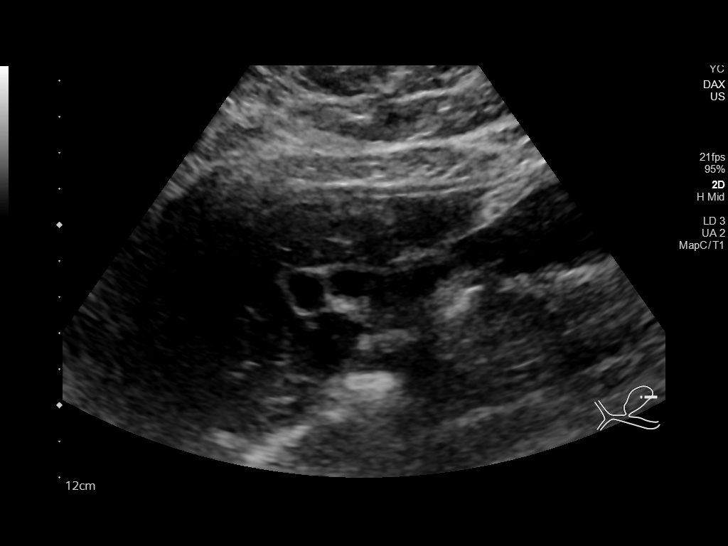
[im 27/59]
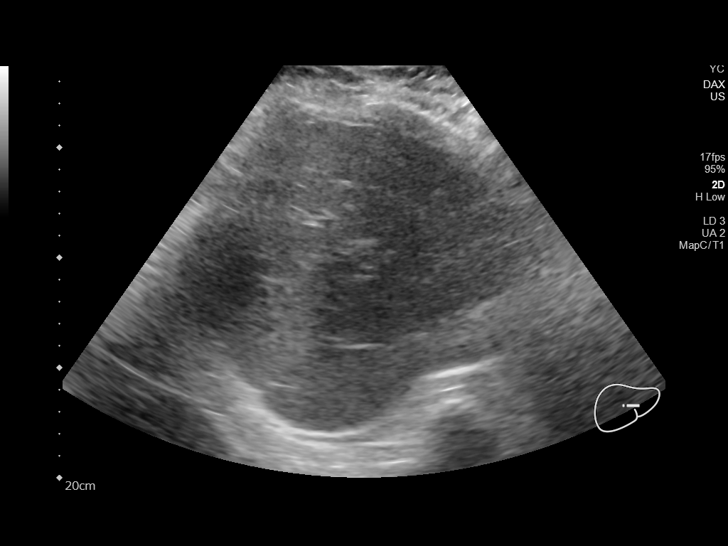
[im 32/59]
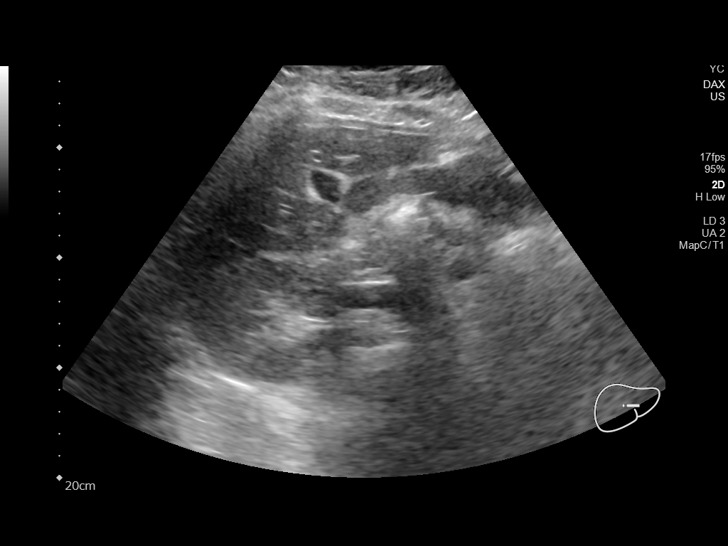
[im 37/59]
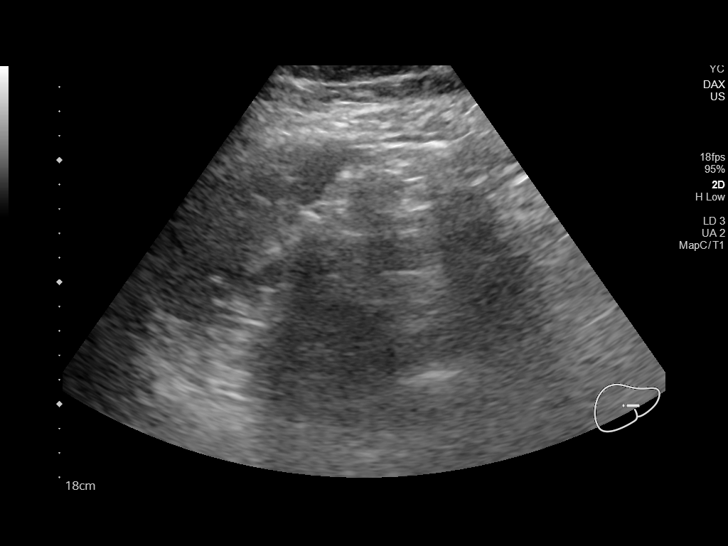
[im 39/59]
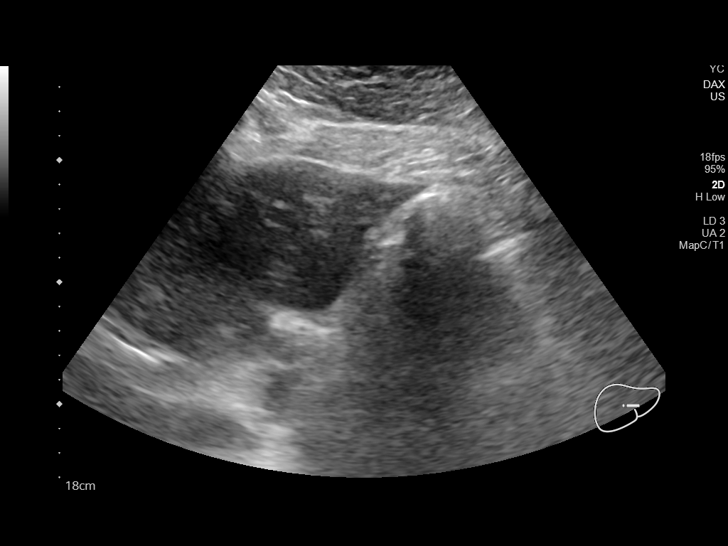
[im 44/59]
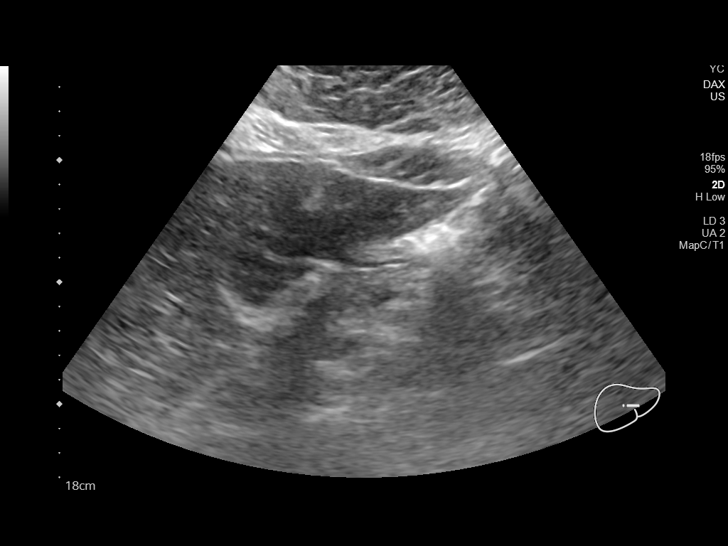
[im 49/59]
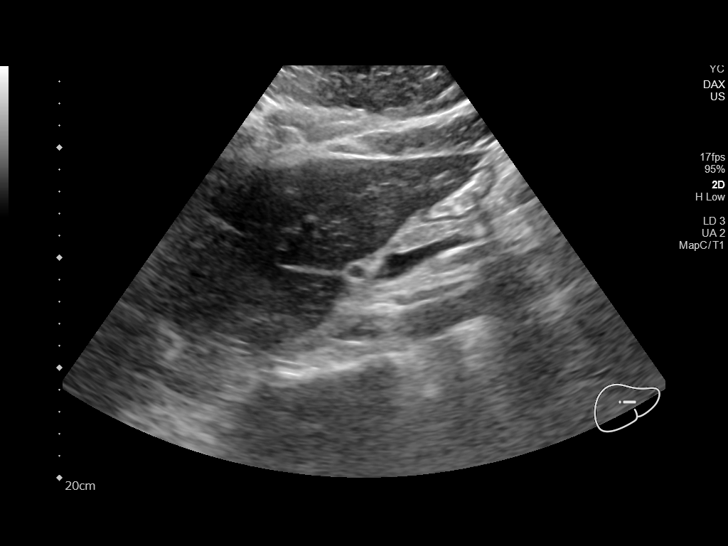
[im 54/59]
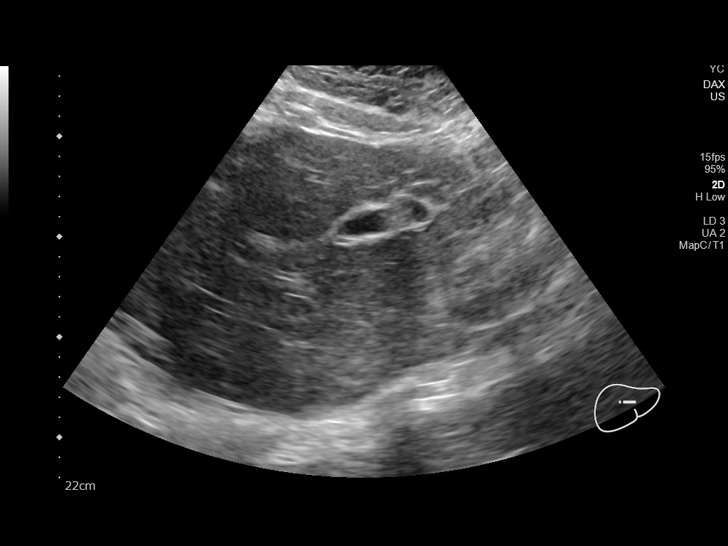
[im 59/59]
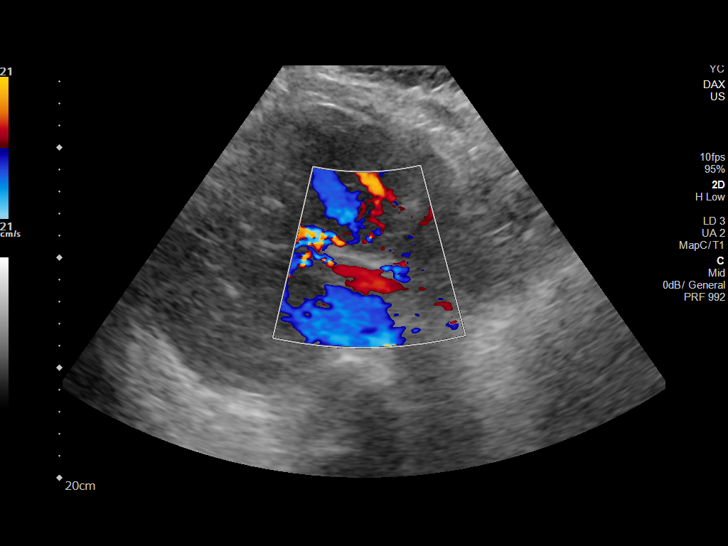

[14 of 25 positions shown; findings below may reference images not displayed]

FINDINGS: Gallbladder:

No gallstones or wall thickening visualized. No sonographic Murphy
sign noted by sonographer.

Common bile duct:

Diameter: 4 mm

Liver:

There is diffuse increased liver echogenicity most commonly seen in
the setting of fatty infiltration. Superimposed inflammation or
fibrosis is not excluded. Clinical correlation is recommended.
portal vein is patent on color Doppler imaging with normal direction
of blood flow towards the liver.

Other: None.
IMPRESSION: Fatty liver, otherwise unremarkable right upper quadrant ultrasound.

## 2022-02-19 IMAGING — DX DG CHEST 1V PORT
1 series · 1 of 1 positions shown · non-contrast
Comparison: None

CLINICAL DATA: Shortness of breath. Arise from [HOSPITAL] having
COVID symptoms.

EXAM:
PORTABLE CHEST 1 VIEW

[chest ap]
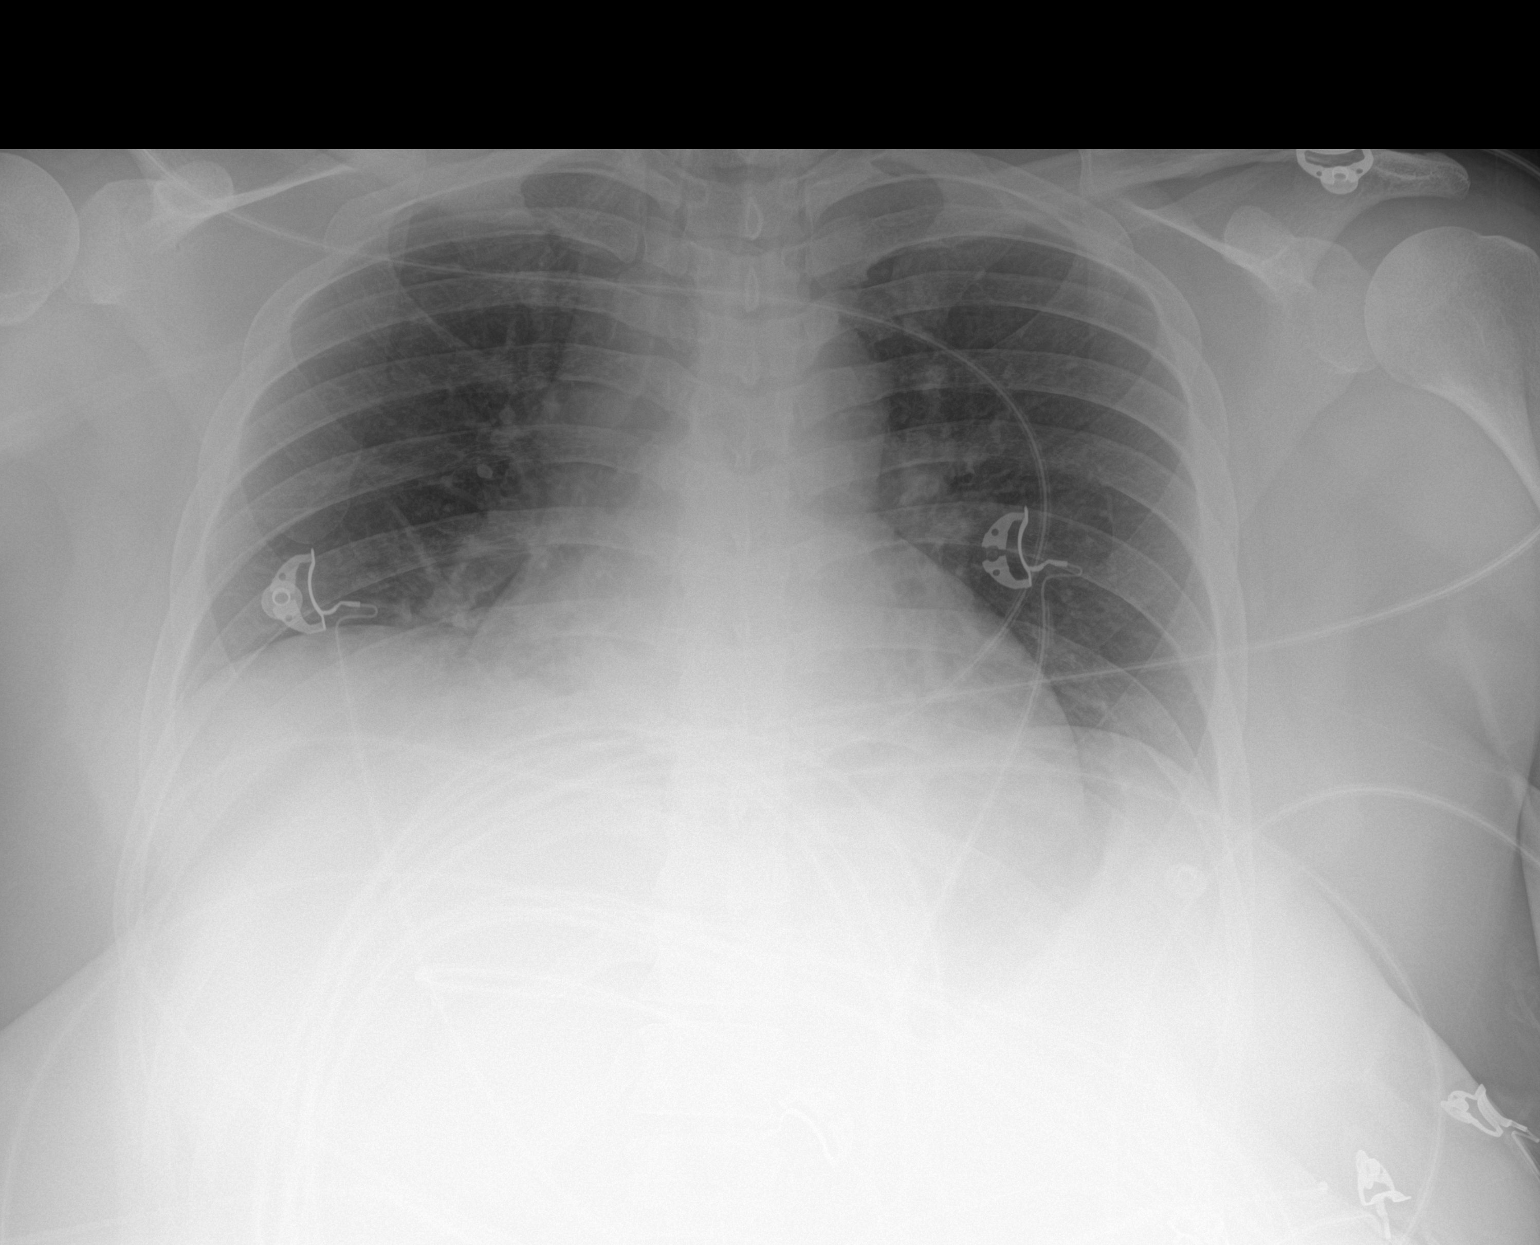

[1 of 1 positions shown; findings below may reference images not displayed]

FINDINGS: Cardiac enlargement with globular appearance of the heart. This
finding is likely also accentuated by the low depth of expansion and
portable technique.

No sign of consolidation. No definite pleural effusion.

Visualized skeletal structures are unremarkable.
IMPRESSION: 1. Marked cardiac enlargement, correlate with any signs of heart
failure or volume overload. With globular appearance the presence of
pericardial effusion is not excluded.
2. Vascular congestion without edema. Limited assessment due to body
habitus and portable technique.

## 2022-02-19 IMAGING — CT CT ANGIO CHEST
2 of 6 series · 18 of 36 positions shown · IV contrast (omnipaque)
Comparison: None.

CLINICAL DATA: Shortness of breath

EXAM:
CT ANGIOGRAPHY CHEST WITH CONTRAST
TECHNIQUE: Multidetector CT imaging of the chest was performed using the
standard protocol during bolus administration of intravenous
contrast. Multiplanar CT image reconstructions and MIPs were
obtained to evaluate the vascular anatomy.
CONTRAST:  100mL OMNIPAQUE IOHEXOL 350 MG/ML SOLN

[Series 5: thins · axial · 0.69mm/px · z∈[-232,+4]mm · 17 of 266 slices shown]
[im 15/266  lung]
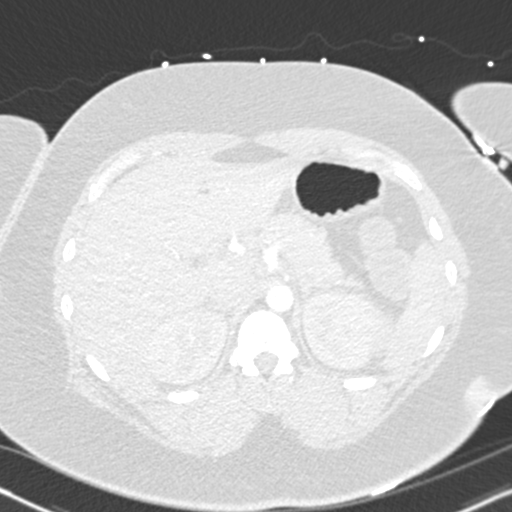
[im 30/266  mediastinal]
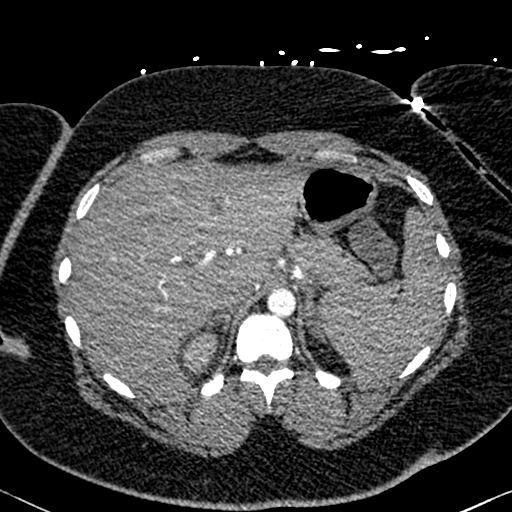
[im 45/266  lung]
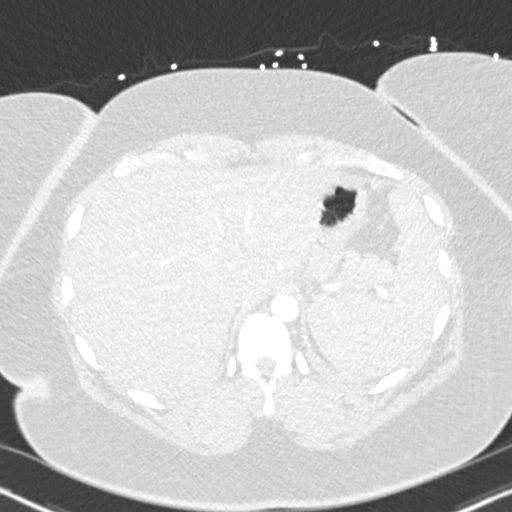
[im 59/266  mediastinal]
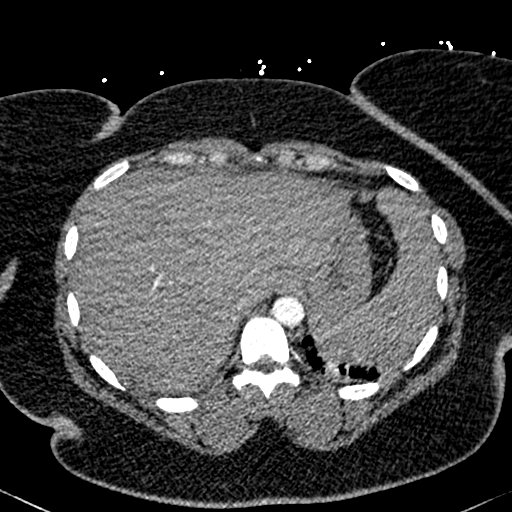
[im 74/266  lung]
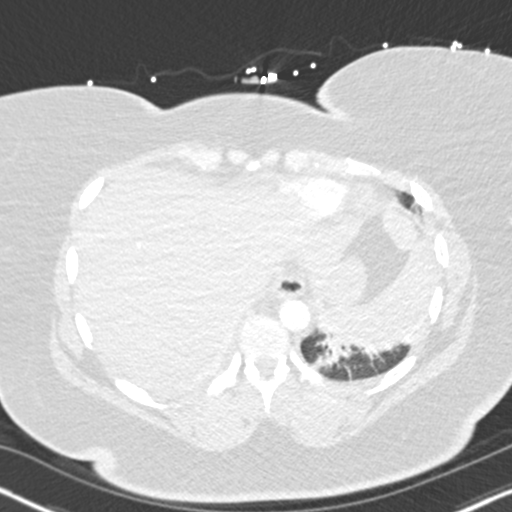
[im 89/266  mediastinal]
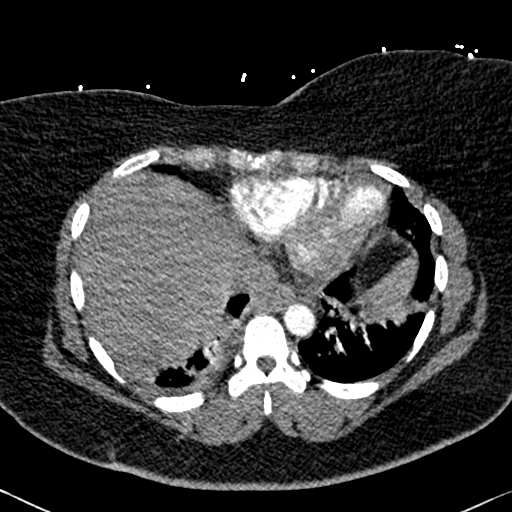
[im 104/266  lung]
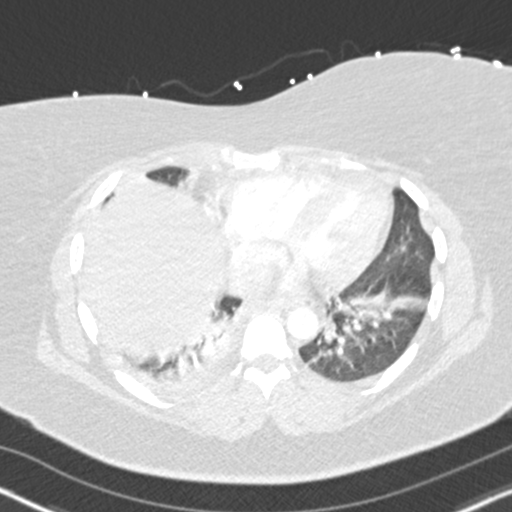
[im 118/266  mediastinal]
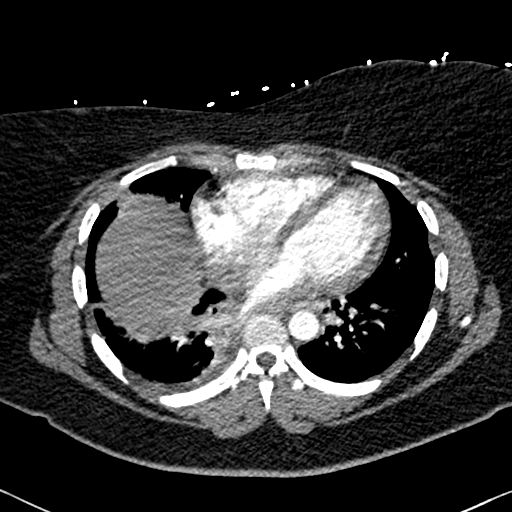
[im 133/266  lung]
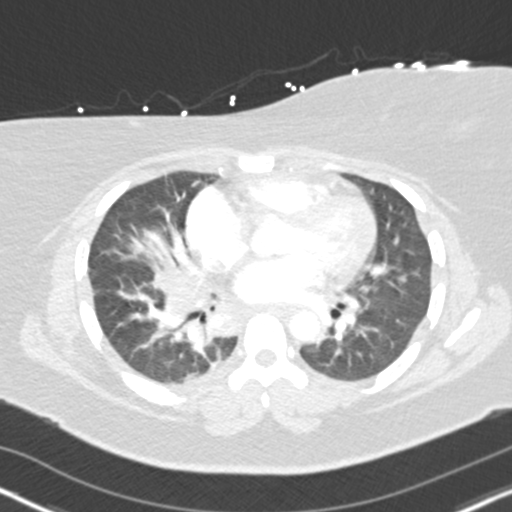
[im 148/266  mediastinal]
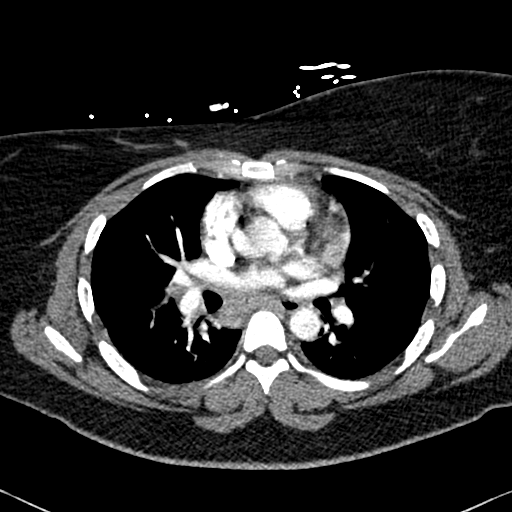
[im 162/266  lung]
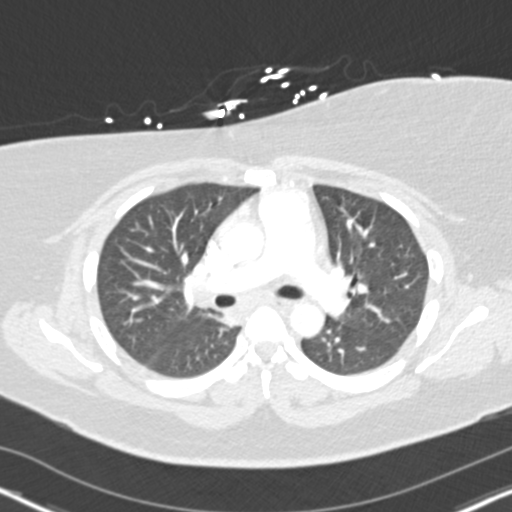
[im 177/266  mediastinal]
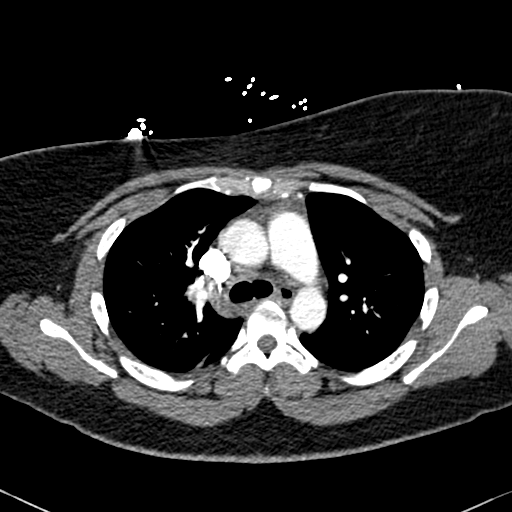
[im 192/266  lung]
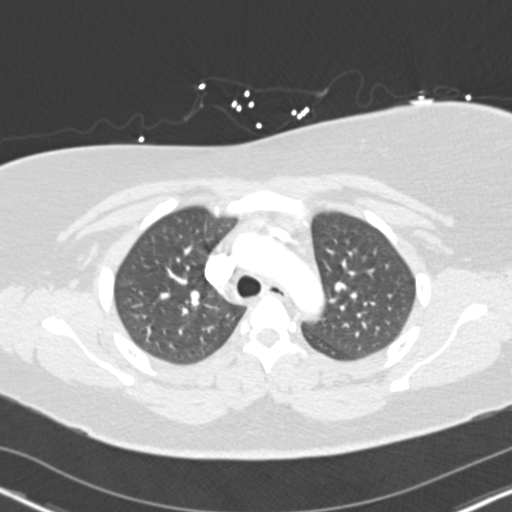
[im 207/266  mediastinal]
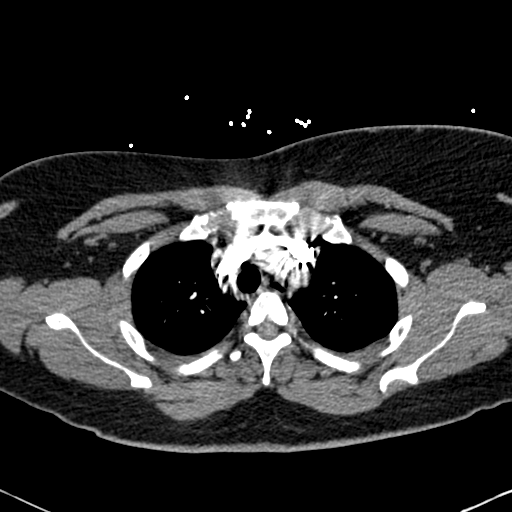
[im 221/266  lung]
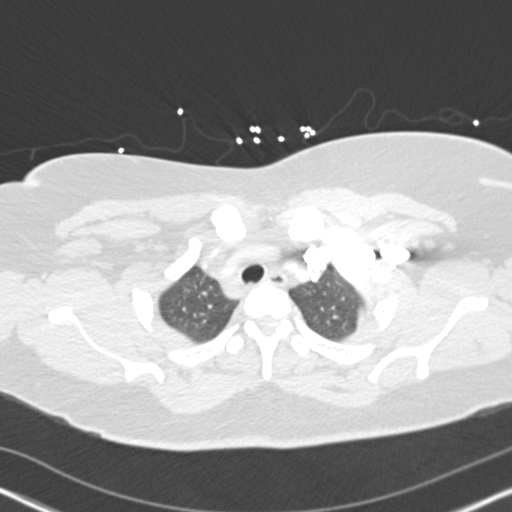
[im 236/266  mediastinal]
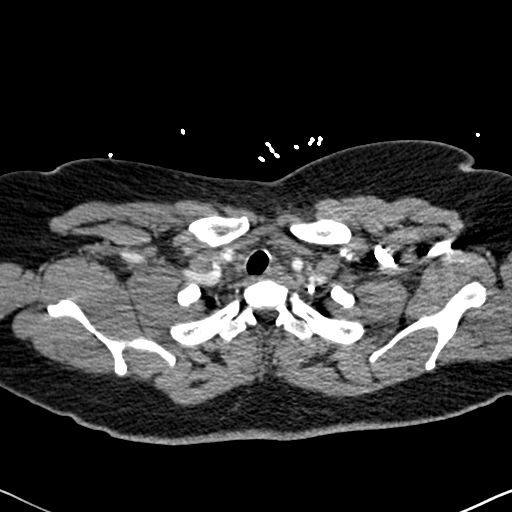
[im 251/266  lung]
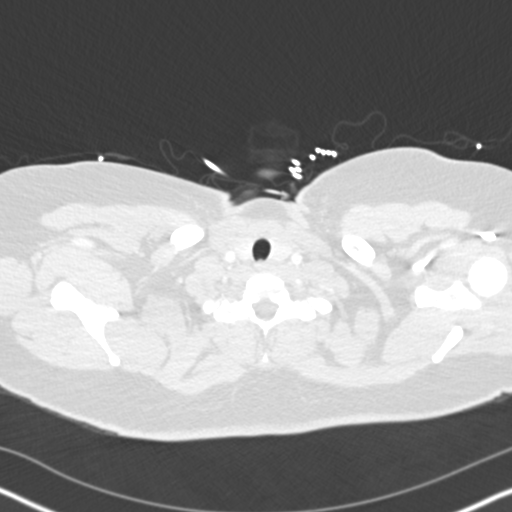

[Series 6: coronal mpr · coronal · 0.57mm/px · 1 of 139 slices shown]
[im 70/139  mediastinal]
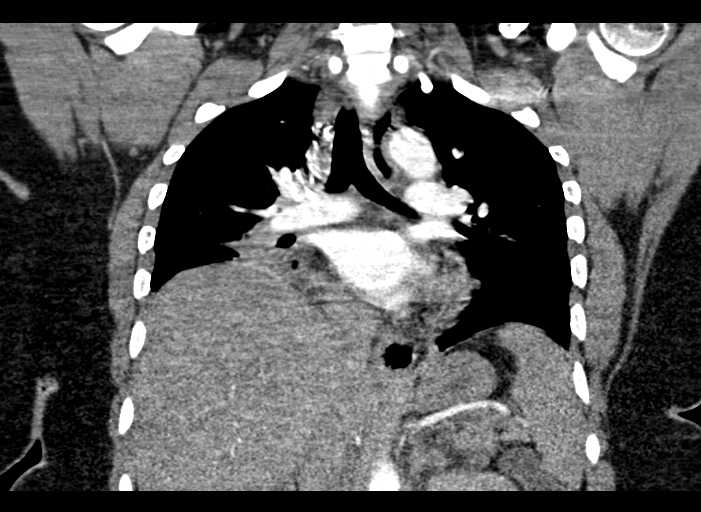

[18 of 36 positions shown; findings below may reference images not displayed]

FINDINGS: Cardiovascular: Satisfactory opacification of the pulmonary arteries
to the segmental level. No evidence of pulmonary embolism.
Cardiomegaly. Enlargement of the main pulmonary artery up to 3.5 cm.
No pericardial effusion.

Mediastinum/Nodes: No enlarged mediastinal, hilar, or axillary lymph
nodes. Thyroid gland, trachea, and esophagus demonstrate no
significant findings.

Lungs/Pleura: There is masslike infrahilar consolidation of the
right lung (series 4, image 68). Small right, trace left pleural
effusions and associated atelectasis or consolidation.

Upper Abdomen: No acute abnormality.

Musculoskeletal: No chest wall abnormality. No acute or significant
osseous findings.

Review of the MIP images confirms the above findings.
IMPRESSION: 1.  Negative examination for pulmonary embolism.

2. There is masslike infrahilar consolidation of the right lung.
Given patient age, this is very likely reflects infection, although
recommend follow-up radiographs at 6 weeks to document complete
resolution and exclude underlying mass.

3. Small right, trace left pleural effusions and associated
atelectasis or consolidation.

4.  Cardiomegaly.

5. Enlargement of the main pulmonary artery up to 3.5 cm, as can be
seen in pulmonary hypertension.

## 2022-02-20 IMAGING — DX DG CHEST 1V PORT
1 series · 1 of 1 positions shown · non-contrast
Comparison: September 23, 2019

CLINICAL DATA: Shortness of breath

EXAM:
PORTABLE CHEST 1 VIEW

[chest ap]
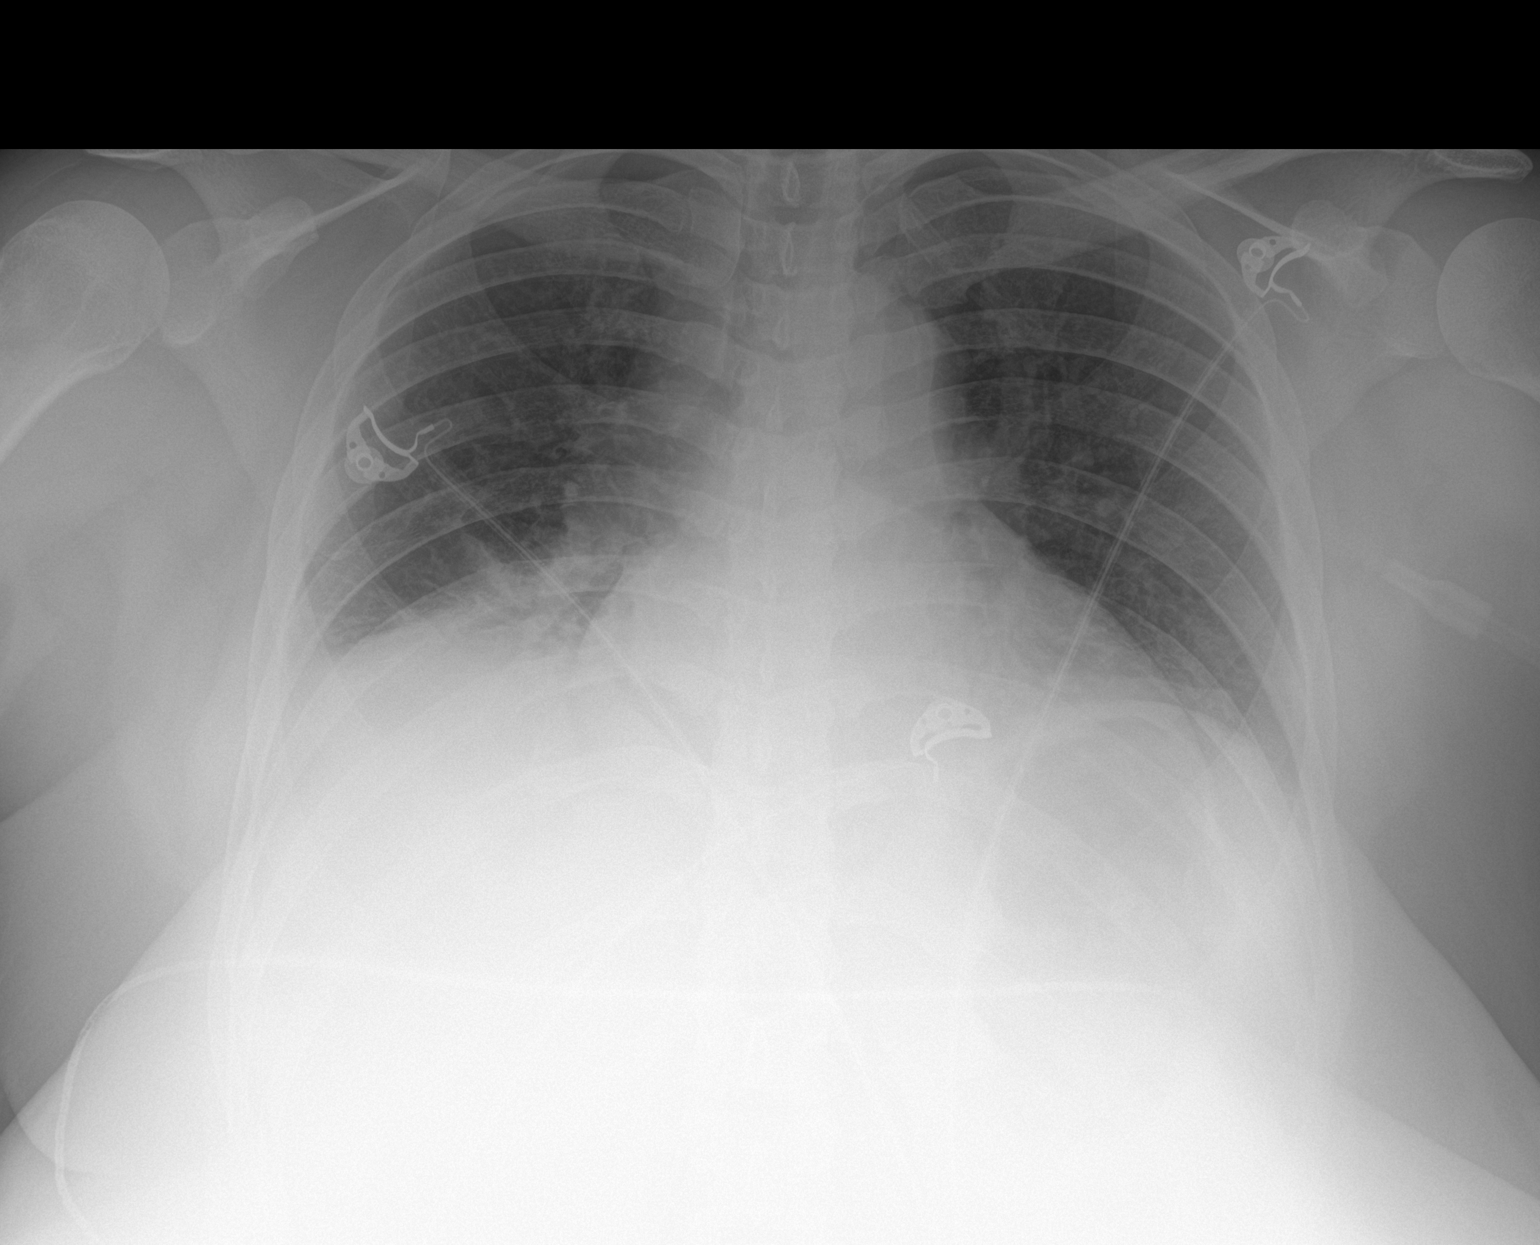

[1 of 1 positions shown; findings below may reference images not displayed]

FINDINGS: There is a small right pleural effusion. There is bibasilar airspace
opacity. Lungs elsewhere are clear. Heart is enlarged with pulmonary
vascularity normal. No adenopathy. No bone lesions.
IMPRESSION: Bibasilar airspace opacity, likely due to pneumonia. Small right
pleural effusion. No new opacity. There is cardiomegaly with
pulmonary vascularity normal. No adenopathy appreciable.

## 2022-02-20 IMAGING — DX DG CHEST 1V PORT
1 series · 1 of 1 positions shown · non-contrast
Comparison: 09/24/2019 at [DATE] a.m.

CLINICAL DATA: [id number] positive, hypoxia

EXAM:
PORTABLE CHEST 1 VIEW

[chest ap]
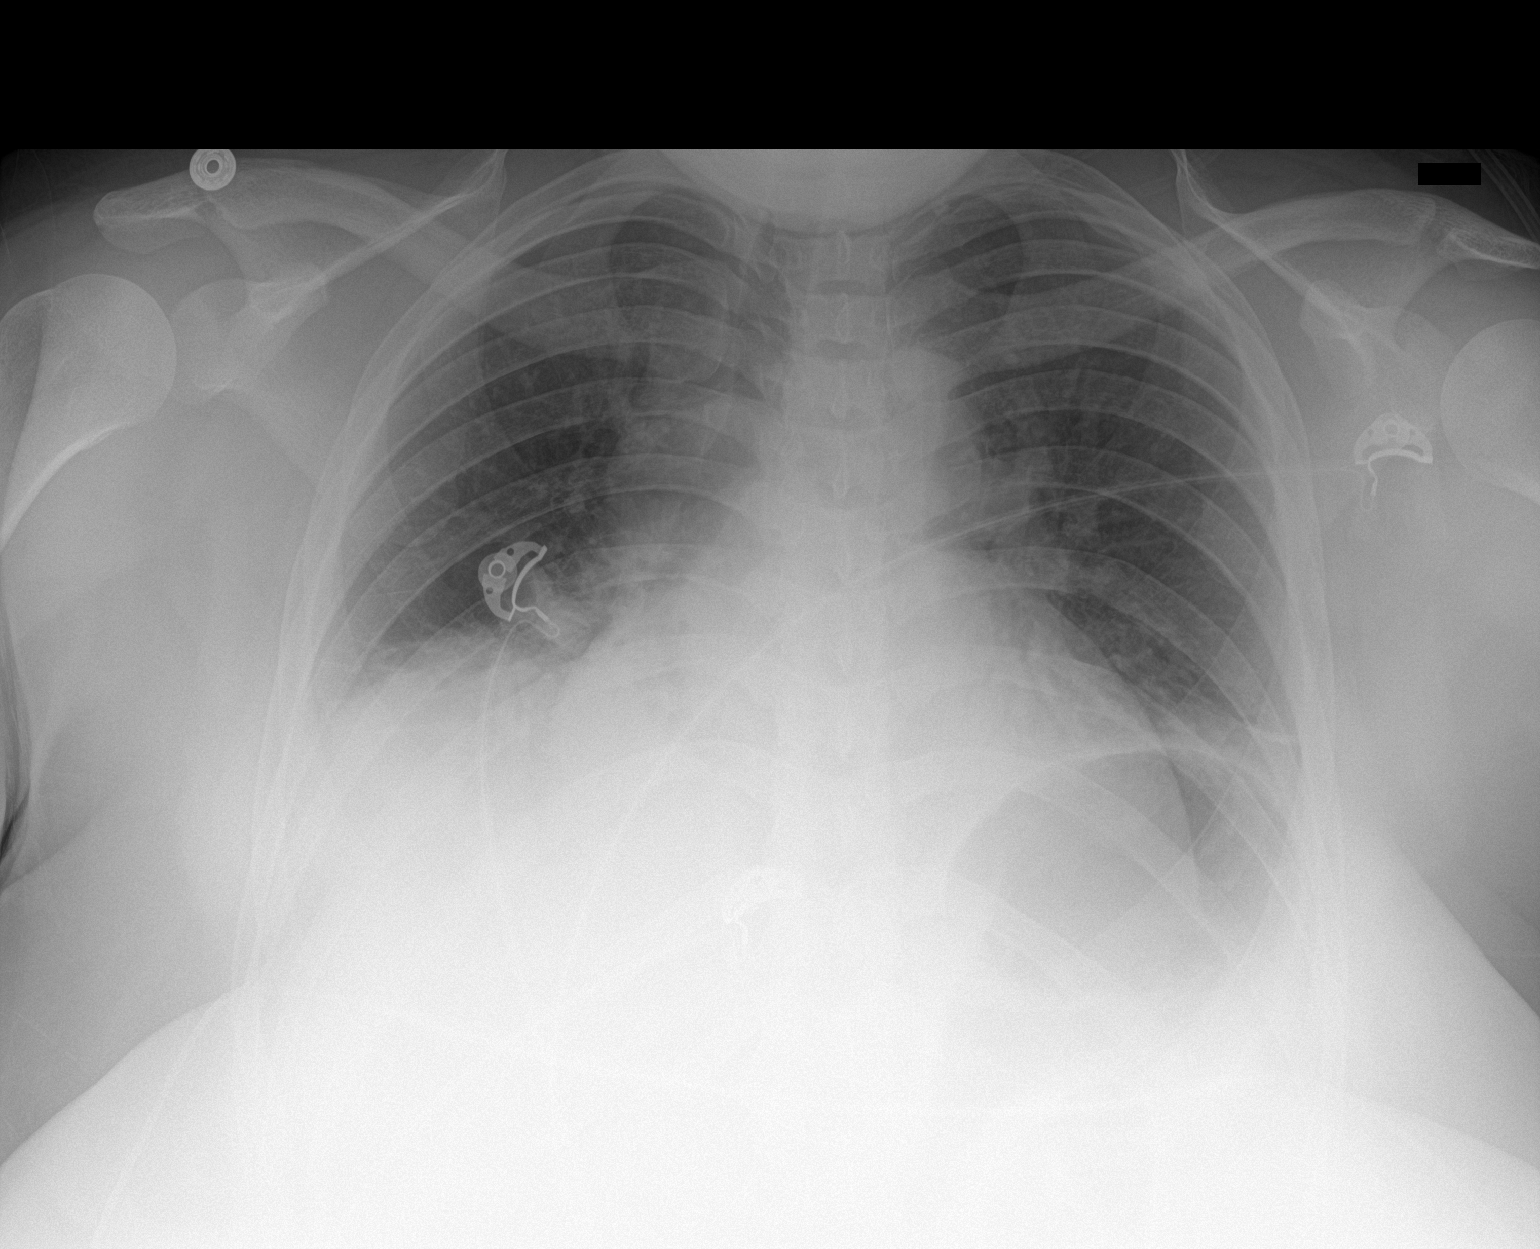

[1 of 1 positions shown; findings below may reference images not displayed]

FINDINGS: Single frontal view of the chest demonstrates persistent enlargement
of the cardiac silhouette. The lung volumes are diminished, with
continued bibasilar consolidation right greater than left. Trace
right pleural effusion unchanged. No pneumothorax. Right hilar
prominence consistent with soft tissue density on recent CT,
possibly reflecting adenopathy.
IMPRESSION: 1. Low lung volumes, with bibasilar consolidation most consistent
with atelectasis.
2. Continued right hilar prominence, likely reflecting adenopathy.
3. Trace right pleural effusion.

## 2022-03-01 DIAGNOSIS — W1830XA Fall on same level, unspecified, initial encounter: Secondary | ICD-10-CM | POA: Diagnosis not present

## 2022-03-02 ENCOUNTER — Emergency Department (HOSPITAL_BASED_OUTPATIENT_CLINIC_OR_DEPARTMENT_OTHER): Payer: 59 | Admitting: Radiology

## 2022-03-02 ENCOUNTER — Encounter (HOSPITAL_BASED_OUTPATIENT_CLINIC_OR_DEPARTMENT_OTHER): Payer: Self-pay | Admitting: Emergency Medicine

## 2022-03-02 ENCOUNTER — Other Ambulatory Visit: Payer: Self-pay

## 2022-03-02 ENCOUNTER — Emergency Department (HOSPITAL_BASED_OUTPATIENT_CLINIC_OR_DEPARTMENT_OTHER)
Admission: EM | Admit: 2022-03-02 | Discharge: 2022-03-03 | Disposition: A | Discharge status: Home / Self Care | Payer: 59 | Attending: Emergency Medicine | Admitting: Emergency Medicine

## 2022-03-02 DIAGNOSIS — W1839XA Other fall on same level, initial encounter: Secondary | ICD-10-CM | POA: Diagnosis not present

## 2022-03-02 DIAGNOSIS — M25562 Pain in left knee: Secondary | ICD-10-CM | POA: Diagnosis not present

## 2022-03-02 DIAGNOSIS — S8992XA Unspecified injury of left lower leg, initial encounter: Secondary | ICD-10-CM | POA: Insufficient documentation

## 2022-03-02 LAB — PREGNANCY, URINE: Preg Test, Ur: NEGATIVE

## 2022-03-02 NOTE — ED Triage Notes (Signed)
Pt arrived POV. Pt reports yesterday [place] the country. Pt states she felt a pop and has not been able to bear weight since. Limited ROM and some swelling to the L knee.

## 2022-03-03 DIAGNOSIS — S8992XA Unspecified injury of left lower leg, initial encounter: Secondary | ICD-10-CM | POA: Diagnosis not present

## 2022-03-03 MED ORDER — HYDROCODONE-ACETAMINOPHEN 5-325 MG PO TABS
1.0000 | ORAL_TABLET | Freq: Once | ORAL | Status: AC
  Administered 2022-03-03: 1 via ORAL
  Filled 2022-03-03: qty 1

## 2022-03-03 MED ORDER — HYDROCODONE-ACETAMINOPHEN 5-325 MG PO TABS: 1.0000 | ORAL_TABLET | Freq: Four times a day (QID) | ORAL | 0 refills | Status: DC | PRN

## 2022-03-03 NOTE — ED Provider Notes (Signed)
[facility]  Provider Note  CSN: [id number] Arrival date & time: 03/02/22 1942  History Chief Complaint  Patient presents with   Knee Injury    Brooke Casey is a 31 y.o. female [place] when she fell yesterday and injured her L knee. She states her kneecap was off to the side, she pushed it back into place but has had persistent pain and unable to bear weight since then. She flew home early because she was not able to get seen where she was. Has been taking APAP with minimal improvement.    Home Medications Prior to Admission medications   Medication Sig Start Date End Date Taking? Authorizing Provider  HYDROcodone-acetaminophen (NORCO/VICODIN) 5-325 MG tablet Take 1 tablet by mouth every 6 (six) hours as needed for severe pain. 03/03/22  Yes Truddie Hidden, MD  albuterol (VENTOLIN HFA) 108 (90 Base) MCG/ACT inhaler Inhale 2 puffs into the lungs every 4 (four) hours as needed for wheezing or shortness of breath (coughing fits). 10/08/21   Garnet Sierras, DO  buPROPion (WELLBUTRIN XL) 150 MG 24 hr tablet Take 1 tablet (150 mg total) by mouth daily. 02/11/22   Bowen, Collene Leyden, DO  cholecalciferol (VITAMIN D3) 25 MCG (1000 UNIT) tablet Take 1,000 Units by mouth daily.    [provider]  EPINEPHrine 0.3 mg/0.3 mL IJ SOAJ injection Inject 0.3 mg into the muscle as needed for anaphylaxis. 01/15/21   Garnet Sierras, DO  hydrOXYzine (ATARAX/VISTARIL) 10 MG tablet Take 1 tablet (10 mg total) by mouth at bedtime as needed for itching. 01/15/21   Garnet Sierras, DO  levocetirizine (XYZAL) 5 MG tablet Take 1 tablet (5 mg total) by mouth every evening. 10/08/21   Garnet Sierras, DO  mometasone (ELOCON) 0.1 % ointment Apply topically 2 (two) times daily as needed (tough, eczema spots). Do not use on the face, neck, armpits or groin area. Do not use more than 3 weeks in a row. 01/15/21   Garnet Sierras, DO  mometasone (ELOCON) 0.1 % ointment  Apply topically. 01/15/21   [provider]  montelukast (SINGULAIR) 10 MG tablet Take 1 tablet (10 mg total) by mouth at bedtime. 10/08/21   Garnet Sierras, DO  Olopatadine-Mometasone (RYALTRIS) [phone] MCG/ACT SUSP Place 1-2 sprays into the nose in the morning and at bedtime. 10/08/21   Garnet Sierras, DO  paragard intrauterine copper IUD IUD Take 1 device by intrauterine route. 11/10/21   [provider]  triamcinolone ointment (KENALOG) 0.1 % Apply 1 application topically 2 (two) times daily as needed (rash flare). Do not use on the face, neck, armpits or groin area. Do not use more than 3 weeks in a row. 01/15/21   Garnet Sierras, DO  triamcinolone ointment (KENALOG) 0.1 % Apply topically. 01/15/21   [provider]  valACYclovir (VALTREX) 1000 MG tablet valacyclovir 1 gram tablet    [provider]  Vitamin D, Ergocalciferol, (DRISDOL) 1.25 MG (50000 UNIT) CAPS capsule Take 1 capsule (50,000 Units total) by mouth every 7 (seven) days. 02/11/22   Bowen, Collene Leyden, DO     Allergies    Shellfish allergy   Review of Systems   Review of Systems Please see HPI for pertinent positives and negatives  Physical Exam BP (!) 128/91   Pulse 94   Temp 98.2 F (36.8 C)   Resp 20   Ht 5\' 5"  (1.651 m)  Wt 112 kg   LMP 02/02/2022 (Approximate)   SpO2 100%   BMI 41.10 kg/m   Physical Exam Vitals and nursing note reviewed.  HENT:     Head: Normocephalic.     Nose: Nose normal.  Eyes:     Extraocular Movements: Extraocular movements intact.  Cardiovascular:     Pulses: Normal pulses.  Pulmonary:     Effort: Pulmonary effort is normal.  Musculoskeletal:        General: Tenderness (diffuse) present. No deformity.     Cervical back: Neck supple.     Comments: Unable to ROM due to pain  Skin:    Findings: No rash (on exposed skin).  Neurological:     Mental Status: She is alert and oriented to person, place, and time.  Psychiatric:        Mood and Affect: Mood  normal.     ED Results / Procedures / Treatments   EKG None  Procedures Procedures  Medications Ordered in the ED Medications  HYDROcodone-acetaminophen (NORCO/VICODIN) 5-325 MG per tablet 1 tablet (has no administration in time range)    Initial Impression and Plan  Patient with L knee injury yesterday, sounds like possibly a patellar dislocation. Exam today is limited, but no signs of large effusion, deformity or vascular compromise. Plan knee brace, crutches, pain meds and ortho follow up. HCG is neg.   ED Course       MDM Rules/Calculators/A&P Medical Decision Making Problems Addressed: Injury of left knee, initial encounter: acute illness or injury  Amount and/or Complexity of Data Reviewed Labs: ordered. Decision-making details documented in ED Course. Radiology: ordered and independent interpretation performed. Decision-making details documented in ED Course.  Risk Prescription drug management.    Final Clinical Impression(s) / ED Diagnoses Final diagnoses:  Injury of left knee, initial encounter    Rx / DC Orders ED Discharge Orders          Ordered    HYDROcodone-acetaminophen (NORCO/VICODIN) 5-325 MG tablet  Every 6 hours PRN        03/03/22 0021             Pollyann Savoy, MD 03/03/22 0021

## 2022-03-09 DIAGNOSIS — M25562 Pain in left knee: Secondary | ICD-10-CM | POA: Diagnosis not present

## 2022-03-11 DIAGNOSIS — Z309 Encounter for contraceptive management, unspecified: Secondary | ICD-10-CM | POA: Diagnosis not present

## 2022-03-11 DIAGNOSIS — N760 Acute vaginitis: Secondary | ICD-10-CM | POA: Diagnosis not present

## 2022-03-12 DIAGNOSIS — M25562 Pain in left knee: Secondary | ICD-10-CM | POA: Diagnosis not present

## 2022-03-16 ENCOUNTER — Encounter (INDEPENDENT_AMBULATORY_CARE_PROVIDER_SITE_OTHER): Payer: Self-pay | Admitting: Family Medicine

## 2022-03-16 ENCOUNTER — Ambulatory Visit (INDEPENDENT_AMBULATORY_CARE_PROVIDER_SITE_OTHER): Payer: 59 | Admitting: Family Medicine

## 2022-03-16 VITALS — BP 113/77 | HR 79 | Temp 98.8°F | Ht 65.0 in | Wt 247.0 lb

## 2022-03-16 DIAGNOSIS — S86912A Strain of unspecified muscle(s) and tendon(s) at lower leg level, left leg, initial encounter: Secondary | ICD-10-CM

## 2022-03-16 DIAGNOSIS — F3289 Other specified depressive episodes: Secondary | ICD-10-CM | POA: Diagnosis not present

## 2022-03-16 DIAGNOSIS — E669 Obesity, unspecified: Secondary | ICD-10-CM

## 2022-03-16 DIAGNOSIS — F32A Depression, unspecified: Secondary | ICD-10-CM | POA: Insufficient documentation

## 2022-03-16 DIAGNOSIS — Z6841 Body Mass Index (BMI) 40.0 and over, adult: Secondary | ICD-10-CM

## 2022-03-16 DIAGNOSIS — E559 Vitamin D deficiency, unspecified: Secondary | ICD-10-CM

## 2022-03-16 MED ORDER — VITAMIN D (ERGOCALCIFEROL) 1.25 MG (50000 UNIT) PO CAPS: 50000.0000 [IU] | ORAL_CAPSULE | ORAL | 0 refills | Status: DC

## 2022-03-17 DIAGNOSIS — M25562 Pain in left knee: Secondary | ICD-10-CM | POA: Diagnosis not present

## 2022-03-19 DIAGNOSIS — M25562 Pain in left knee: Secondary | ICD-10-CM | POA: Diagnosis not present

## 2022-03-22 DIAGNOSIS — M25562 Pain in left knee: Secondary | ICD-10-CM | POA: Diagnosis not present

## 2022-03-24 NOTE — Progress Notes (Signed)
Chief Complaint:   OBESITY Brooke Casey is here to discuss her progress with her obesity treatment plan along with follow-up of her obesity related diagnoses. Brooke Casey is on the Category 2 Plan and states she is following her eating plan approximately 60% of the time. Brooke Casey states she is not exercising.   Today's visit was #: 4 Starting weight: 255 lbs Starting date: 01/05/2022 Today's weight: 247 lbs Today's date: 03/16/2022 Total lbs lost to date: 8 lbs Total lbs lost since last in-office visit: 0  Interim History: She injured her left knee while in [place].  She dislocated patella, she is in a knee brace, now being seen by ortho. Exercise has been limited.  She is out of 2nd joub right now and money is tight.  Doing PT but has had to stay out of the gym.  Drinks a protein shake for breakfast some mornings.   Subjective:   1. Vitamin D deficiency She is currently taking prescription vitamin D 50,000 IU each week. She denies nausea, vomiting or muscle weakness. Last vitamin d level 29.8.   2. Strain of left knee, initial encounter She is seeing [facility].  She has started PT and has crutches. She is using ice as needed.  Wearing a knee brace.   3. Other depression with emotional eating She is on Wellbutrin XL 150 mg every day, she has paused taking this while taking hydrocodone for pain.   Assessment/Plan:   1. Vitamin D deficiency Recheck Vitamin D level in 2 months.   Refill - Vitamin D, Ergocalciferol, (DRISDOL) 1.25 MG (50000 UNIT) CAPS capsule; Take 1 capsule (50,000 Units total) by mouth every 7 (seven) days.  Dispense: 5 capsule; Refill: 0  2. Strain of left knee, initial encounter Limit exercise per orthopedist.  Try chair upper body exercises at home.   3. Other depression with emotional eating Resume Wellbutrin XL 150 mg daily.   4. Obesity,current BMI 41.2 Reviewed cost friendly foods for fiber and protein.   Brooke Casey is currently in the action stage of change. As  such, her goal is to continue with weight loss efforts. She has agreed to the Category 2 Plan.   Exercise goals: All adults should avoid inactivity. Some physical activity is better than none, and adults who participate in any amount of physical activity gain some health benefits.  Behavioral modification strategies: increasing lean protein intake, increasing vegetables, increasing water intake, decreasing liquid calories, decreasing eating out, no skipping meals, meal planning and cooking strategies, keeping healthy foods in the home, and decreasing junk food.  Brooke Casey has agreed to follow-up with our clinic in 3 weeks. She was informed of the importance of frequent follow-up visits to maximize her success with intensive lifestyle modifications for her multiple health conditions.   Objective:   Blood pressure 113/77, pulse 79, temperature 98.8 F (37.1 C), height 5\' 5"  (1.651 m), weight 247 lb (112 kg), last menstrual period 02/02/2022, SpO2 98 %, unknown if currently breastfeeding. Body mass index is 41.1 kg/m.  General: Cooperative, alert, well developed, in no acute distress. HEENT: Conjunctivae and lids unremarkable. Cardiovascular: Regular rhythm.  Lungs: Normal work of breathing. Neurologic: No focal deficits.   Lab Results  Component Value Date   CREATININE 0.74 01/05/2022   BUN 7 01/05/2022   NA 138 01/05/2022   K 4.0 01/05/2022   CL 101 01/05/2022   CO2 21 01/05/2022   Lab Results  Component Value Date   ALT 31 01/05/2022   AST 23  01/05/2022   ALKPHOS 78 01/05/2022   BILITOT 0.5 01/05/2022   Lab Results  Component Value Date   HGBA1C 5.2 01/05/2022   HGBA1C 5.0 11/01/2019   HGBA1C 5.7 (H) 09/27/2019   No results found for: "INSULIN" Lab Results  Component Value Date   TSH 1.160 01/05/2022   Lab Results  Component Value Date   CHOL 215 (H) 01/05/2022   HDL 54 01/05/2022   LDLCALC 146 (H) 01/05/2022   TRIG 84 01/05/2022   CHOLHDL 4.0 01/05/2022   Lab  Results  Component Value Date   VD25OH 29.8 (L) 01/05/2022   Lab Results  Component Value Date   WBC 4.9 01/05/2022   HGB 13.2 01/05/2022   HCT 38.8 01/05/2022   MCV 92 01/05/2022   PLT 276 01/05/2022   Lab Results  Component Value Date   FERRITIN 49 01/05/2022   Attestation Statements:   Reviewed by clinician on day of visit: allergies, medications, problem list, medical history, surgical history, family history, social history, and previous encounter notes.  I, Davy Pique, am acting as Location manager for Loyal Gambler, DO.  I have reviewed the above documentation for accuracy and completeness, and I agree with the above. Dell Ponto, DO

## 2022-04-01 DIAGNOSIS — M25562 Pain in left knee: Secondary | ICD-10-CM | POA: Diagnosis not present

## 2022-04-06 DIAGNOSIS — M25562 Pain in left knee: Secondary | ICD-10-CM | POA: Diagnosis not present

## 2022-04-13 ENCOUNTER — Encounter (INDEPENDENT_AMBULATORY_CARE_PROVIDER_SITE_OTHER): Payer: Self-pay | Admitting: Family Medicine

## 2022-04-13 ENCOUNTER — Ambulatory Visit (INDEPENDENT_AMBULATORY_CARE_PROVIDER_SITE_OTHER): Payer: 59 | Admitting: Family Medicine

## 2022-04-13 VITALS — BP 116/82 | HR 84 | Temp 98.1°F | Ht 65.0 in | Wt 245.0 lb

## 2022-04-13 DIAGNOSIS — E669 Obesity, unspecified: Secondary | ICD-10-CM

## 2022-04-13 DIAGNOSIS — R632 Polyphagia: Secondary | ICD-10-CM | POA: Diagnosis not present

## 2022-04-13 DIAGNOSIS — E559 Vitamin D deficiency, unspecified: Secondary | ICD-10-CM

## 2022-04-13 DIAGNOSIS — F3289 Other specified depressive episodes: Secondary | ICD-10-CM

## 2022-04-13 DIAGNOSIS — Z6841 Body Mass Index (BMI) 40.0 and over, adult: Secondary | ICD-10-CM

## 2022-04-13 MED ORDER — BUPROPION HCL ER (XL) 150 MG PO TB24: 150.0000 mg | ORAL_TABLET | Freq: Every day | ORAL | 0 refills | Status: DC

## 2022-04-13 MED ORDER — VITAMIN D (ERGOCALCIFEROL) 1.25 MG (50000 UNIT) PO CAPS: 50000.0000 [IU] | ORAL_CAPSULE | ORAL | 0 refills | Status: DC

## 2022-04-15 DIAGNOSIS — M25562 Pain in left knee: Secondary | ICD-10-CM | POA: Diagnosis not present

## 2022-04-23 NOTE — Progress Notes (Signed)
Chief Complaint:   OBESITY Brooke Casey is here to discuss her progress with her obesity treatment plan along with follow-up of her obesity related diagnoses. Brooke Casey is on the Category 2 Plan and states she is following her eating plan approximately 30% of the time. Brooke Casey states she is weight lifting and walking and lifting weights 30-60 minutes 2 times per week.  Today's visit was #: 5 Starting weight: 255 lbs Starting date: 01/05/2022 Today's weight: 245 lbs Today's date: 04/13/2022 Total lbs lost to date: 10 lbs Total lbs lost since last in-office visit: 2 lbs  Interim History: Net weight loss has been 10 lbs in 3 months.  She has been struggling with meal plan.  She has GI upset with consuming meat.  She likes sweet tea.  Likes fruit and veggies.  Has some hunger and cravings.    Subjective:   1. Vitamin D deficiency She is currently taking prescription vitamin D 50,000 IU each week. She denies nausea, vomiting or muscle weakness. Last Vitamin D level 29.8.   2. Polyphagia Worsening.  Will work to eat on a schedule.  Increase protein and fiber with meals and limit refined carbs, sweets that increase hunger.    3. Other depression with emotional eating Ran out of Wellbutrin XL 150 mg daily.  Mood savings worse off Wellbutrin and with new form of birth control.   Assessment/Plan:   1. Vitamin D deficiency Recheck Vitamin D level next office visit.   Refill - Vitamin D, Ergocalciferol, (DRISDOL) 1.25 MG (50000 UNIT) CAPS capsule; Take 1 capsule (50,000 Units total) by mouth every 7 (seven) days.  Dispense: 5 capsule; Refill: 0  2. Polyphagia Consider tirzepatide once available.   3. Other depression with emotional eating Refill - buPROPion (WELLBUTRIN XL) 150 MG 24 hr tablet; Take 1 tablet (150 mg total) by mouth daily.  Dispense: 30 tablet; Refill: 0  4. Obesity,current BMI 40.8 1) Change to Pescatarian plan.  2) Reduce sweet tea intake 3) Discussed high protein and high  fiber foods.   Brooke Casey is currently in the action stage of change. As such, her goal is to continue with weight loss efforts. She has agreed to the [employer].   Exercise goals:  Increase exercise to 3 times per week.    Behavioral modification strategies: increasing lean protein intake, increasing vegetables, increasing water intake, decreasing eating out, no skipping meals, meal planning and cooking strategies, keeping healthy foods in the home, holiday eating strategies , and decreasing junk food.  Brooke Casey has agreed to follow-up with our clinic in 3-4 weeks. She was informed of the importance of frequent follow-up visits to maximize her success with intensive lifestyle modifications for her multiple health conditions.   Objective:   Blood pressure 116/82, pulse 84, temperature 98.1 F (36.7 C), height 5\' 5"  (1.651 m), weight 245 lb (111.1 kg), SpO2 98 %, unknown if currently breastfeeding. Body mass index is 40.77 kg/m.  General: Cooperative, alert, well developed, in no acute distress. HEENT: Conjunctivae and lids unremarkable. Cardiovascular: Regular rhythm.  Lungs: Normal work of breathing. Neurologic: No focal deficits.   Lab Results  Component Value Date   CREATININE 0.74 01/05/2022   BUN 7 01/05/2022   NA 138 01/05/2022   K 4.0 01/05/2022   CL 101 01/05/2022   CO2 21 01/05/2022   Lab Results  Component Value Date   ALT 31 01/05/2022   AST 23 01/05/2022   ALKPHOS 78 01/05/2022   BILITOT 0.5 01/05/2022  Lab Results  Component Value Date   HGBA1C 5.2 01/05/2022   HGBA1C 5.0 11/01/2019   HGBA1C 5.7 (H) 09/27/2019   No results found for: "INSULIN" Lab Results  Component Value Date   TSH 1.160 01/05/2022   Lab Results  Component Value Date   CHOL 215 (H) 01/05/2022   HDL 54 01/05/2022   LDLCALC 146 (H) 01/05/2022   TRIG 84 01/05/2022   CHOLHDL 4.0 01/05/2022   Lab Results  Component Value Date   VD25OH 29.8 (L) 01/05/2022   Lab Results  Component  Value Date   WBC 4.9 01/05/2022   HGB 13.2 01/05/2022   HCT 38.8 01/05/2022   MCV 92 01/05/2022   PLT 276 01/05/2022   Lab Results  Component Value Date   FERRITIN 49 01/05/2022    Attestation Statements:   Reviewed by clinician on day of visit: allergies, medications, problem list, medical history, surgical history, family history, social history, and previous encounter notes.  I, Malcolm Metro, am acting as Energy manager for Seymour Bars, DO.  I have reviewed the above documentation for accuracy and completeness, and I agree with the above. Glennis Brink, DO

## 2022-04-28 DIAGNOSIS — M25562 Pain in left knee: Secondary | ICD-10-CM | POA: Diagnosis not present

## 2022-05-04 DIAGNOSIS — M25562 Pain in left knee: Secondary | ICD-10-CM | POA: Diagnosis not present

## 2022-05-11 DIAGNOSIS — M25562 Pain in left knee: Secondary | ICD-10-CM | POA: Diagnosis not present

## 2022-05-12 ENCOUNTER — Ambulatory Visit (INDEPENDENT_AMBULATORY_CARE_PROVIDER_SITE_OTHER): Payer: 59 | Admitting: Family Medicine

## 2022-06-07 ENCOUNTER — Other Ambulatory Visit (HOSPITAL_COMMUNITY): Payer: Self-pay

## 2022-06-07 ENCOUNTER — Encounter (INDEPENDENT_AMBULATORY_CARE_PROVIDER_SITE_OTHER): Payer: Self-pay | Admitting: Family Medicine

## 2022-06-07 ENCOUNTER — Ambulatory Visit (INDEPENDENT_AMBULATORY_CARE_PROVIDER_SITE_OTHER): Payer: Medicaid Other | Admitting: Family Medicine

## 2022-06-07 VITALS — BP 106/69 | HR 69 | Temp 98.4°F | Ht 65.0 in | Wt 250.0 lb

## 2022-06-07 DIAGNOSIS — Z6841 Body Mass Index (BMI) 40.0 and over, adult: Secondary | ICD-10-CM | POA: Diagnosis not present

## 2022-06-07 DIAGNOSIS — E559 Vitamin D deficiency, unspecified: Secondary | ICD-10-CM | POA: Diagnosis not present

## 2022-06-07 DIAGNOSIS — E66813 Obesity, class 3: Secondary | ICD-10-CM

## 2022-06-07 DIAGNOSIS — R632 Polyphagia: Secondary | ICD-10-CM

## 2022-06-07 DIAGNOSIS — F3289 Other specified depressive episodes: Secondary | ICD-10-CM

## 2022-06-07 DIAGNOSIS — E669 Obesity, unspecified: Secondary | ICD-10-CM | POA: Diagnosis not present

## 2022-06-07 MED ORDER — VITAMIN D (ERGOCALCIFEROL) 1.25 MG (50000 UNIT) PO CAPS: 50000.0000 [IU] | ORAL_CAPSULE | ORAL | 0 refills | Status: DC

## 2022-06-07 MED ORDER — BUPROPION HCL ER (XL) 150 MG PO TB24: 150.0000 mg | ORAL_TABLET | Freq: Every day | ORAL | 0 refills | Status: DC

## 2022-06-07 MED ORDER — WEGOVY 0.25 MG/0.5ML ~~LOC~~ SOAJ
0.2500 mg | SUBCUTANEOUS | 0 refills | Status: DC
  Filled 2022-06-07 – 2022-06-29 (×3): qty 2, 28d supply, fill #0

## 2022-06-08 ENCOUNTER — Other Ambulatory Visit (HOSPITAL_COMMUNITY): Payer: Self-pay

## 2022-06-08 ENCOUNTER — Telehealth (INDEPENDENT_AMBULATORY_CARE_PROVIDER_SITE_OTHER): Payer: Self-pay | Admitting: *Deleted

## 2022-06-08 NOTE — Telephone Encounter (Signed)
Prior authorization done for patients [employer]. Waiting on determination. 

## 2022-06-09 NOTE — Telephone Encounter (Signed)
Prior authorization approved for her 5.  06/09/2022 to 12/29/2022 Patient notified.

## 2022-06-21 NOTE — Progress Notes (Signed)
Chief Complaint:   OBESITY Brooke Casey is here to discuss her progress with her obesity treatment plan along with follow-up of her obesity related diagnoses. Brooke Casey is on the [place] and states she is following her eating plan approximately 50% of the time. Brooke Casey states she is weight lifting 35 minutes 3-4 times per week.  Today's visit was #: 6 Starting weight: 255 LBS Starting date: 01/05/2022 Today's weight: 250 LBS Today's date: 06/07/2022 Total lbs lost to date: 5 LBS Total lbs lost since last in-office visit: +5 LBS  Interim History: Net weight loss 5 pounds in 5 months of medically supervised weight management.  Patient admits to getting off track with the holidays.  Complains of hunger and cravings, but denies meal skipping.  Subjective:   1. Vitamin D deficiency Patient is on prescription vitamin D 50,000 IU weekly.  Vitamin D level on 01/05/2022 was 29.8.  Patient is complaining of fatigue.  2. Other depression with emotional eating Patient is taking Wellbutrin XL 150 mg daily.  Patient has run out of prescriptions.  She has stress at work.  Assessment/Plan:   1. Vitamin D deficiency Check vitamin D level at next office visit.  Refill- Vitamin D, Ergocalciferol, (DRISDOL) 1.25 MG (50000 UNIT) CAPS capsule; Take 1 capsule (50,000 Units total) by mouth every 7 (seven) days.  Dispense: 5 capsule; Refill: 0  2. Other depression with emotional eating Continue stress reduction techniques.  Refill- buPROPion (WELLBUTRIN XL) 150 MG 24 hr tablet; Take 1 tablet (150 mg total) by mouth daily.  Dispense: 30 tablet; Refill: 0  3. Obesity,current BMI 41.7 Patient denies a personal or family history of pancreatitis, medullary thyroid carcinoma or multiple endocrine neoplasia type II. Recommend reviewing pen training video online.  Patient is using Nexplanon for birth control.  Begin- Semaglutide-Weight Management (WEGOVY) 0.25 MG/0.5ML SOAJ; Inject 0.25 mg into the skin once a  week.  Dispense: 2 mL; Refill: 0  Brooke Casey is currently in the action stage of change. As such, her goal is to get back to weightloss efforts . She has agreed to the [employer].   Exercise goals:  As is.  Behavioral modification strategies: increasing lean protein intake, increasing vegetables, increasing water intake, decreasing eating out, no skipping meals, meal planning and cooking strategies, keeping healthy foods in the home, planning for success, and decreasing junk food.  Brooke Casey has agreed to follow-up with our clinic in 4 weeks. She was informed of the importance of frequent follow-up visits to maximize her success with intensive lifestyle modifications for her multiple health conditions.   Objective:   Blood pressure 106/69, pulse 69, temperature 98.4 F (36.9 C), height 5\' 5"  (1.651 m), weight 250 lb (113.4 kg), SpO2 98 %, unknown if currently breastfeeding. Body mass index is 41.6 kg/m.  General: Cooperative, alert, well developed, in no acute distress. HEENT: Conjunctivae and lids unremarkable. Cardiovascular: Regular rhythm.  Lungs: Normal work of breathing. Neurologic: No focal deficits.   Lab Results  Component Value Date   CREATININE 0.74 01/05/2022   BUN 7 01/05/2022   NA 138 01/05/2022   K 4.0 01/05/2022   CL 101 01/05/2022   CO2 21 01/05/2022   Lab Results  Component Value Date   ALT 31 01/05/2022   AST 23 01/05/2022   ALKPHOS 78 01/05/2022   BILITOT 0.5 01/05/2022   Lab Results  Component Value Date   HGBA1C 5.2 01/05/2022   HGBA1C 5.0 11/01/2019   HGBA1C 5.7 (H) 09/27/2019   No  results found for: "INSULIN" Lab Results  Component Value Date   TSH 1.160 01/05/2022   Lab Results  Component Value Date   CHOL 215 (H) 01/05/2022   HDL 54 01/05/2022   LDLCALC 146 (H) 01/05/2022   TRIG 84 01/05/2022   CHOLHDL 4.0 01/05/2022   Lab Results  Component Value Date   VD25OH 29.8 (L) 01/05/2022   Lab Results  Component Value Date   WBC 4.9  01/05/2022   HGB 13.2 01/05/2022   HCT 38.8 01/05/2022   MCV 92 01/05/2022   PLT 276 01/05/2022   Lab Results  Component Value Date   FERRITIN 49 01/05/2022   Attestation Statements:   Reviewed by clinician on day of visit: allergies, medications, problem list, medical history, surgical history, family history, social history, and previous encounter notes.  I, Davy Pique, am acting as Location manager for Loyal Gambler, DO.  I have reviewed the above documentation for accuracy and completeness, and I agree with the above. Dell Ponto, DO

## 2022-06-29 ENCOUNTER — Other Ambulatory Visit (HOSPITAL_COMMUNITY): Payer: Self-pay

## 2022-07-07 ENCOUNTER — Ambulatory Visit
Admission: EM | Admit: 2022-07-07 | Discharge: 2022-07-07 | Disposition: A | Discharge status: Home / Self Care | Payer: Medicaid Other | Attending: Urgent Care | Admitting: Urgent Care

## 2022-07-07 DIAGNOSIS — Z20822 Contact with and (suspected) exposure to covid-19: Secondary | ICD-10-CM | POA: Diagnosis not present

## 2022-07-07 DIAGNOSIS — Z20818 Contact with and (suspected) exposure to other bacterial communicable diseases: Secondary | ICD-10-CM | POA: Diagnosis not present

## 2022-07-07 DIAGNOSIS — B349 Viral infection, unspecified: Secondary | ICD-10-CM | POA: Diagnosis not present

## 2022-07-07 LAB — POCT RAPID STREP A (OFFICE): Rapid Strep A Screen: NEGATIVE

## 2022-07-07 MED ORDER — PROMETHAZINE-DM 6.25-15 MG/5ML PO SYRP: 5.0000 mL | ORAL_SOLUTION | Freq: Three times a day (TID) | ORAL | 0 refills | Status: DC | PRN

## 2022-07-07 MED ORDER — PSEUDOEPHEDRINE HCL 60 MG PO TABS: 60.0000 mg | ORAL_TABLET | Freq: Three times a day (TID) | ORAL | 0 refills | Status: DC | PRN

## 2022-07-07 MED ORDER — CETIRIZINE HCL 10 MG PO TABS: 10.0000 mg | ORAL_TABLET | Freq: Every day | ORAL | 0 refills | Status: DC

## 2022-07-07 NOTE — ED Provider Notes (Signed)
[facility]  Note:  This document was prepared using Systems analyst and may include unintentional dictation errors.  MRN: [id number] DOB: 23-Jun-1990  Subjective:   Brooke Casey is a 32 y.o. female presenting for 2-day history of acute onset persistent fever, light, productive cough, throat discomfort, drainage.  Patient had exposure to both COVID and strep.  Would like to be tested for both.  Presents with her family of 2 younger sons and her spouse all of whom are being seen for the same symptoms.  No history of asthma.  Patient has allergies.  Patient is not a smoker, no vaping or marijuana use.  No current facility-administered medications for this encounter.  Current Outpatient Medications:    albuterol (VENTOLIN HFA) 108 (90 Base) MCG/ACT inhaler, Inhale 2 puffs into the lungs every 4 (four) hours as needed for wheezing or shortness of breath (coughing fits)., Disp: 18 g, Rfl: 1   buPROPion (WELLBUTRIN XL) 150 MG 24 hr tablet, Take 1 tablet (150 mg total) by mouth daily., Disp: 30 tablet, Rfl: 0   cholecalciferol (VITAMIN D3) 25 MCG (1000 UNIT) tablet, Take 1,000 Units by mouth daily., Disp: , Rfl:    EPINEPHrine 0.3 mg/0.3 mL IJ SOAJ injection, Inject 0.3 mg into the muscle as needed for anaphylaxis., Disp: 1 each, Rfl: 2   HYDROcodone-acetaminophen (NORCO/VICODIN) 5-325 MG tablet, Take 1 tablet by mouth every 6 (six) hours as needed for severe pain., Disp: 12 tablet, Rfl: 0   hydrOXYzine (ATARAX/VISTARIL) 10 MG tablet, Take 1 tablet (10 mg total) by mouth at bedtime as needed for itching., Disp: 30 tablet, Rfl: 3   levocetirizine (XYZAL) 5 MG tablet, Take 1 tablet (5 mg total) by mouth every evening., Disp: 30 tablet, Rfl: 5   mometasone (ELOCON) 0.1 % ointment, Apply topically 2 (two) times daily as needed (tough, eczema spots). Do not use on the face, neck, armpits or groin area. Do not use more than 3 weeks in a row., Disp: 45 g, Rfl: 2    mometasone (ELOCON) 0.1 % ointment, Apply topically., Disp: , Rfl:    montelukast (SINGULAIR) 10 MG tablet, Take 1 tablet (10 mg total) by mouth at bedtime., Disp: 30 tablet, Rfl: 5   Olopatadine-Mometasone (RYALTRIS) 665-25 MCG/ACT SUSP, Place 1-2 sprays into the nose in the morning and at bedtime., Disp: 29 g, Rfl: 5   Semaglutide-Weight Management (WEGOVY) 0.25 MG/0.5ML SOAJ, Inject 0.25 mg into the skin once a week., Disp: 2 mL, Rfl: 0   triamcinolone ointment (KENALOG) 0.1 %, Apply 1 application topically 2 (two) times daily as needed (rash flare). Do not use on the face, neck, armpits or groin area. Do not use more than 3 weeks in a row., Disp: 80 g, Rfl: 2   triamcinolone ointment (KENALOG) 0.1 %, Apply topically., Disp: , Rfl:    valACYclovir (VALTREX) 1000 MG tablet, valacyclovir 1 gram tablet, Disp: , Rfl:    Vitamin D, Ergocalciferol, (DRISDOL) 1.25 MG (50000 UNIT) CAPS capsule, Take 1 capsule (50,000 Units total) by mouth every 7 (seven) days., Disp: 5 capsule, Rfl: 0   Allergies  Allergen Reactions   Shellfish Allergy Nausea And Vomiting    Past Medical History:  Diagnosis Date   Allergy    Anxiety    Back pain    Depression    Eczema    HSV (herpes simplex virus) anogenital infection    Hyperlipidemia    Lactose intolerance    Medical history non-contributory  Velamentous insertion of umbilical cord    Vitamin D deficiency      Past Surgical History:  Procedure Laterality Date   CESAREAN SECTION N/A 01/28/2017   Procedure: CESAREAN SECTION;  Surgeon: Janyth Pupa, DO;  Location: [facility];  Service: Obstetrics;  Laterality: N/A;    Family History  Problem Relation Age of Onset   Obesity Mother    Anxiety disorder Mother    Sleep apnea Mother    Stroke Father    Hyperlipidemia Father    Hypertension Father    Allergic rhinitis Neg Hx    Angioedema Neg Hx    Asthma Neg Hx    Atopy Neg Hx    Eczema Neg Hx    Immunodeficiency Neg Hx    Urticaria  Neg Hx     Social History   Tobacco Use   Smoking status: Never   Smokeless tobacco: Never  Vaping Use   Vaping Use: Never used  Substance Use Topics   Alcohol use: Yes    Comment: rare   Drug use: No    ROS   Objective:   Vitals: BP 110/81 (BP Location: Right Arm)   Pulse 89   Temp 98.9 F (37.2 C) (Oral)   Resp 18   SpO2 97%   Physical Exam Constitutional:      General: She is not in acute distress.    Appearance: Normal appearance. She is well-developed and normal weight. She is not ill-appearing, toxic-appearing or diaphoretic.  HENT:     Head: Normocephalic and atraumatic.     Right Ear: Tympanic membrane, ear canal and external ear normal. No drainage or tenderness. No middle ear effusion. There is no impacted cerumen. Tympanic membrane is not erythematous or bulging.     Left Ear: Tympanic membrane, ear canal and external ear normal. No drainage or tenderness.  No middle ear effusion. There is no impacted cerumen. Tympanic membrane is not erythematous or bulging.     Nose: Nose normal. No congestion or rhinorrhea.     Mouth/Throat:     Mouth: Mucous membranes are moist. No oral lesions.     Pharynx: No pharyngeal swelling, oropharyngeal exudate, posterior oropharyngeal erythema or uvula swelling.     Tonsils: No tonsillar exudate or tonsillar abscesses.  Eyes:     General: No scleral icterus.       Right eye: No discharge.        Left eye: No discharge.     Extraocular Movements: Extraocular movements intact.     Right eye: Normal extraocular motion.     Left eye: Normal extraocular motion.     Conjunctiva/sclera: Conjunctivae normal.  Cardiovascular:     Rate and Rhythm: Normal rate and regular rhythm.     Heart sounds: Normal heart sounds. No murmur heard.    No friction rub. No gallop.  Pulmonary:     Effort: Pulmonary effort is normal. No respiratory distress.     Breath sounds: No stridor. No wheezing, rhonchi or rales.  Chest:     Chest wall: No  tenderness.  Musculoskeletal:     Cervical back: Normal range of motion and neck supple.  Lymphadenopathy:     Cervical: No cervical adenopathy.  Skin:    General: Skin is warm and dry.  Neurological:     General: No focal deficit present.     Mental Status: She is alert and oriented to person, place, and time.  Psychiatric:        Mood  and Affect: Mood normal.        Behavior: Behavior normal.     Rapid strep test negative.  Assessment and Plan :   PDMP not reviewed this encounter.  1. Acute viral syndrome     Deferred imaging given clear cardiopulmonary exam, hemodynamically stable vital signs. Will manage for viral illness such as viral URI, viral syndrome, viral rhinitis, COVID-19, viral pharyngitis. Recommended supportive care. Offered scripts for symptomatic relief. COVID 19 and strep culture are pending. Counseled patient on potential for adverse effects with medications prescribed/recommended today, ER and return-to-clinic precautions discussed, patient verbalized understanding.     Jaynee Eagles, [place] 07/08/22 [phone]

## 2022-07-07 NOTE — ED Triage Notes (Addendum)
Pt c/o prod cough x 48 hours with +covid and +strep exposure-NAD-steady gait

## 2022-07-07 NOTE — Discharge Instructions (Signed)
We will notify you of your test results as they arrive and may take between about 24 hours.  I encourage you to sign up for MyChart if you have not already done so as this can be the easiest way for us to communicate results to you online or through a phone app.  Generally, we only contact you if it is a positive test result.  In the meantime, if you develop worsening symptoms including fever, chest pain, shortness of breath despite our current treatment plan then please report to the emergency room as this may be a sign of worsening status from possible viral infection.  Otherwise, we will manage this as a viral syndrome. For sore throat or cough try using a honey-based tea. Use 3 teaspoons of honey with juice squeezed from half lemon. Place shaved pieces of ginger into 1/2-1 cup of water and warm over stove top. Then mix the ingredients and repeat every 4 hours as needed. Please take Tylenol 500mg-650mg every 6 hours for aches and pains, fevers. Hydrate very well with at least 2 liters of water. Eat light meals such as soups to replenish electrolytes and soft fruits, veggies. Start an antihistamine like Zyrtec (10mg daily) for postnasal drainage, sinus congestion.  You can take this together with pseudoephedrine (Sudafed) at a dose of 60 mg 2-3 times a day as needed for the same kind of congestion.  Use the cough medications as needed.   

## 2022-07-08 LAB — SARS CORONAVIRUS 2 (TAT 6-24 HRS): SARS Coronavirus 2: NEGATIVE

## 2022-07-10 LAB — CULTURE, GROUP A STREP (THRC)

## 2022-07-12 ENCOUNTER — Encounter (INDEPENDENT_AMBULATORY_CARE_PROVIDER_SITE_OTHER): Payer: Self-pay

## 2022-07-12 ENCOUNTER — Ambulatory Visit (INDEPENDENT_AMBULATORY_CARE_PROVIDER_SITE_OTHER): Payer: Commercial Managed Care - PPO | Admitting: Family Medicine

## 2022-07-13 ENCOUNTER — Other Ambulatory Visit (HOSPITAL_COMMUNITY): Payer: Self-pay

## 2022-07-25 IMAGING — MR MR CARD MORPHOLOGY WO/W CM
44 of 48 series · 44 of 48 positions shown · IV contrast (gadavist)
Comparison: none

CLINICAL DATA: 28-year-old patient with dyspnea on exertion and
elevated troponin after Covid infection.

EXAM:
CARDIAC MRI
TECHNIQUE: The patient was scanned on a 1.5 Tesla GE magnet. A dedicated
cardiac coil was used. Functional imaging was done using Fiesta
sequences. [DATE], and 4 chamber views were done to assess for RWMA's.
Modified Itsamari rule using a short axis stack was used to
calculate an ejection fraction on a dedicated work station using
Circle software. The patient received 10 cc of Gadavist. After 10
minutes inversion recovery sequences were used to assess for
infiltration and scar tissue.
CONTRAST:  10 cc of Gadavist

[Series 4: t2_haste_db_tra_bh · axial · 8.0mm · 1.33mm/px · 1 of 16 slices shown]
[im 1/16]
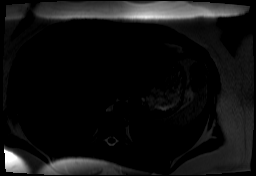

[Series 8: bSSFP · oblique · 8.0mm · 1.79mm/px · 1 of 25 slices shown (1 of 27)]
[im 1/25]
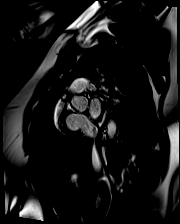

[Series 9: bSSFP · oblique · 8.0mm · 1.79mm/px · 1 of 25 slices shown (2 of 27)]
[im 1/25]
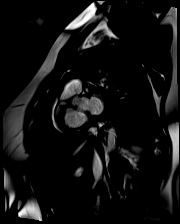

[Series 10: bSSFP · oblique · 8.0mm · 1.79mm/px · 1 of 25 slices shown (3 of 27)]
[im 1/25]
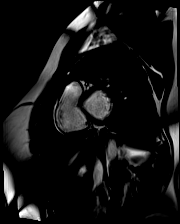

[Series 11: bSSFP · oblique · 8.0mm · 1.79mm/px · 1 of 25 slices shown (4 of 27)]
[im 1/25]
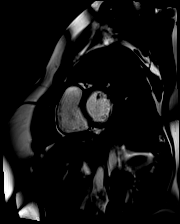

[Series 12: bSSFP · oblique · 8.0mm · 1.79mm/px · 1 of 25 slices shown (5 of 27)]
[im 1/25]
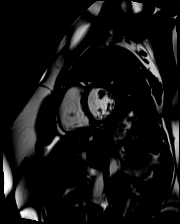

[Series 13: bSSFP · oblique · 8.0mm · 1.79mm/px · 1 of 25 slices shown (6 of 27)]
[im 1/25]
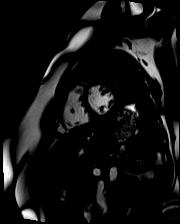

[Series 14: bSSFP · oblique · 8.0mm · 1.79mm/px · 1 of 25 slices shown (7 of 27)]
[im 1/25]
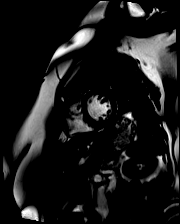

[Series 15: bSSFP · oblique · 8.0mm · 1.79mm/px · 1 of 25 slices shown (8 of 27)]
[im 1/25]
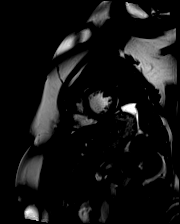

[Series 16: bSSFP · oblique · 8.0mm · 1.79mm/px · 1 of 25 slices shown (9 of 27)]
[im 1/25]
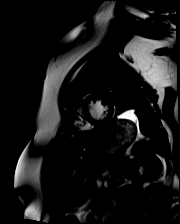

[Series 17: bSSFP · oblique · 8.0mm · 1.79mm/px · 1 of 25 slices shown (10 of 27)]
[im 1/25]
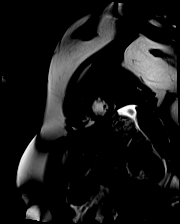

[Series 18: bSSFP · oblique · 8.0mm · 1.79mm/px · 1 of 25 slices shown (11 of 27)]
[im 1/25]
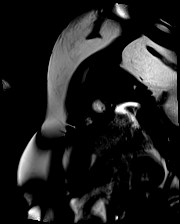

[Series 19: bSSFP · oblique · 8.0mm · 1.79mm/px · 1 of 25 slices shown (12 of 27)]
[im 1/25]
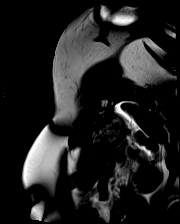

[Series 20: bSSFP · oblique · 8.0mm · 1.79mm/px · 1 of 25 slices shown (13 of 27)]
[im 1/25]
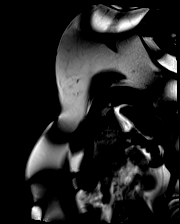

[Series 21: bSSFP · oblique · 8.0mm · 1.79mm/px · 1 of 25 slices shown (14 of 27)]
[im 1/25]
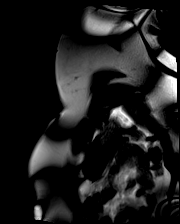

[Series 22: bSSFP · oblique · 8.0mm · 1.79mm/px · 1 of 25 slices shown (15 of 27)]
[im 1/25]
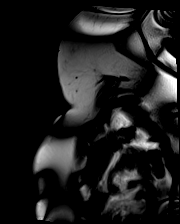

[Series 23: bSSFP · oblique · 6.0mm · 1.41mm/px · 1 of 25 slices shown (16 of 27)]
[im 1/25]
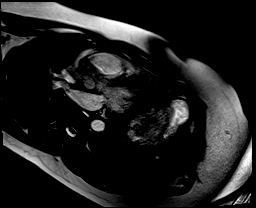

[Series 24: bSSFP · oblique · 6.0mm · 1.41mm/px · 1 of 25 slices shown (17 of 27)]
[im 1/25]
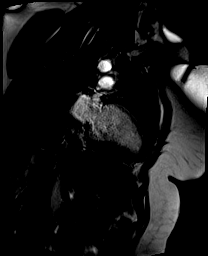

[Series 25: bSSFP · oblique · 6.0mm · 1.41mm/px · 1 of 25 slices shown (18 of 27)]
[im 1/25]
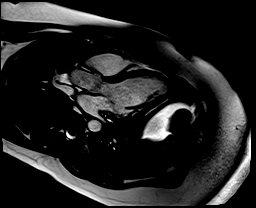

[Series 26: bSSFP · axial · 6.0mm · 1.41mm/px · 1 of 25 slices shown (19 of 27)]
[im 1/25]
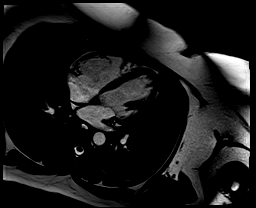

[Series 27: (id)_long_t1 · oblique · 8.0mm · 1.60mm/px · 1 of 24 slices shown]
[im 1/24]
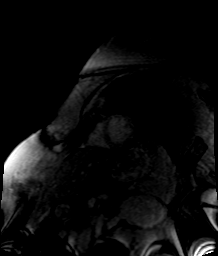

[Series 28: (id)_long_t1_moco · oblique · 8.0mm · 1.60mm/px · 1 of 24 slices shown]
[im 1/24]
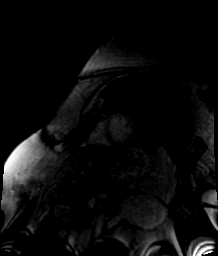

[Series 29: (id)_long_t1_moco_t1 · oblique · 8.0mm · 1.60mm/px · 1 of 6 slices shown]
[im 1/6]
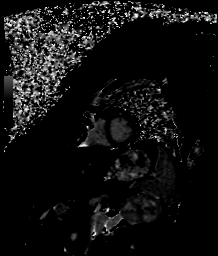

[Series 31: STIR · oblique · 8.0mm · 1.97mm/px · 1 of 7 slices shown (1 of 2)]
[im 1/7]
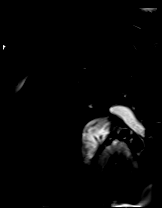

[Series 32: bSSFP · axial · 6.0mm · 1.41mm/px · 1 of 25 slices shown (20 of 27)]
[im 1/25]
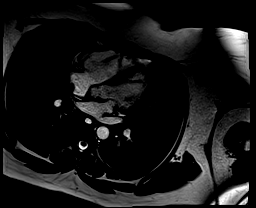

[Series 34: STIR · oblique · 8.0mm · 1.97mm/px · 1 of 14 slices shown (2 of 2)]
[im 1/14]
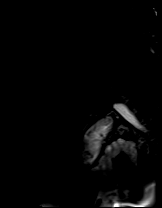

[Series 35: bSSFP · coronal · 6.0mm · 1.41mm/px · 1 of 25 slices shown (21 of 27)]
[im 1/25]
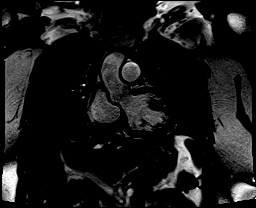

[Series 36: cine rvit · oblique · 6.0mm · 1.41mm/px · 1 of 25 slices shown]
[im 1/25]
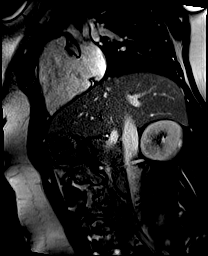

[Series 37: aortic valve cine · oblique · 6.0mm · 1.41mm/px · 1 of 25 slices shown]
[im 1/25]
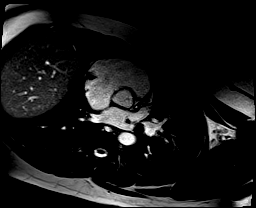

[Series 38: bSSFP · axial · 6.0mm · 1.41mm/px · 1 of 25 slices shown (22 of 27)]
[im 1/25]
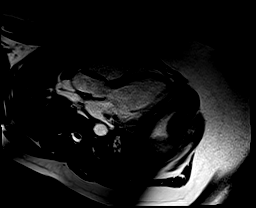

[Series 39: cine rvot · sagittal · 6.0mm · 1.41mm/px · 1 of 25 slices shown]
[im 1/25]
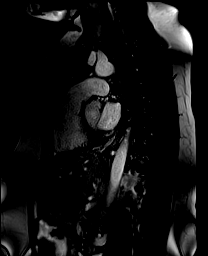

[Series 40: flow_100_tp_retro_bh · axial · 6.0mm · 1.73mm/px · 1 of 30 slices shown]
[im 1/30]
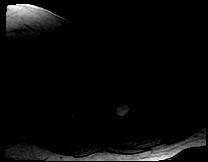

[Series 41: flow_100_tp_retro_bh_mag · axial · 6.0mm · 1.73mm/px · 1 of 22 slices shown]
[im 1/22]
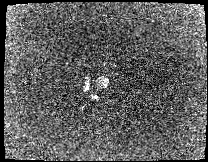

[Series 42: flow_100_tp_retro_bh_p · axial · 6.0mm · 1.73mm/px · 1 of 30 slices shown]
[im 1/30]
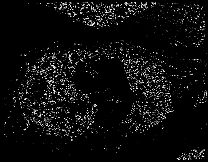

[Series 44: lge_single shot sa · oblique · 8.0mm · 1.98mm/px · 1 of 17 slices shown (1 of 2)]
[im 1/17]
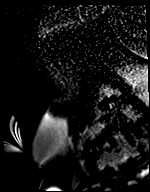

[Series 45: lge_single shot sa · oblique · 8.0mm · 1.98mm/px · 1 of 17 slices shown (2 of 2)]
[im 1/17]
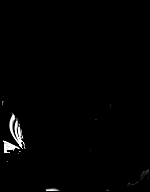

[Series 52: (id)_short_t1 · oblique · 8.0mm · 1.60mm/px · 1 of 27 slices shown]
[im 1/27]
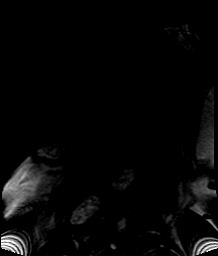

[Series 53: (id)_short_t1_moco · oblique · 8.0mm · 1.60mm/px · 1 of 27 slices shown]
[im 1/27]
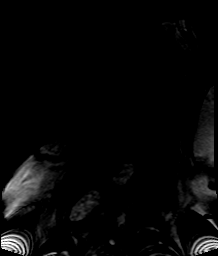

[Series 54: (id)_short_t1_moco_t1 · oblique · 8.0mm · 1.60mm/px · 1 of 6 slices shown]
[im 1/6]
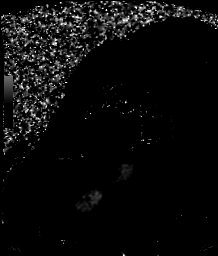

[Series 57: bSSFP · oblique · 8.0mm · 2.02mm/px · 1 of 17 slices shown (23 of 27)]
[im 1/17]
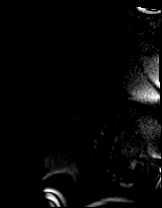

[Series 58: bSSFP · oblique · 8.0mm · 2.02mm/px · 1 of 17 slices shown (24 of 27)]
[im 1/17]
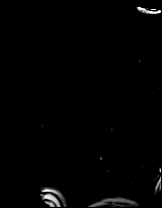

[Series 59: bSSFP · oblique · 8.0mm · 2.02mm/px · 1 of 17 slices shown (25 of 27)]
[im 1/17]
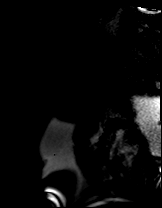

[Series 60: bSSFP · oblique · 8.0mm · 2.02mm/px · 1 of 17 slices shown (26 of 27)]
[im 1/17]
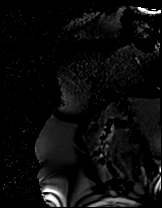

[Series 61: bSSFP · oblique · 8.0mm · 2.16mm/px · 1 of 17 slices shown (27 of 27)]
[im 1/17]
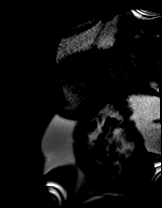

[44 of 48 positions shown; findings below may reference images not displayed]

FINDINGS: 1. Normal left ventricular size, thickness and systolic function
(LVEF = 58%). There is hypokinesis and endocardial late gadolinium
enhancement in the basal inferior wall (50% transmurality).

LVEDD: 53 mm

LVESD: 39 mm

LVEDV: 118 ml

LVESV: 50 ml

SV: 68 ml

CO: 4.7 L/min

Myocardial mass: 84 g

2. Normal right ventricular size, thickness and systolic function
(LVEF = 57%). There are no regional wall motion abnormalities.

3.  Normal left and right atrial size.

4. Normal size of the aortic root, ascending aorta and pulmonary
artery.

5.  Trivial mitral and mild tricuspid regurgitation.

6.  Normal pericardium.  No pericardial effusion.
IMPRESSION: 1. Normal left ventricular size, thickness and systolic function
(LVEF = 58%). There is hypokinesis and endocardial late gadolinium
enhancement in the basal inferior wall (50% transmurality).

2. Normal right ventricular size, thickness and systolic function
(LVEF = 57%). There are no regional wall motion abnormalities.

3.  Normal left and right atrial size.

4. Normal size of the aortic root, ascending aorta and pulmonary
artery.

5.  Trivial mitral and mild tricuspid regurgitation.

6.  Normal pericardium.  No pericardial effusion.

Focal hypokinesis and endocardial late gadolinium enhancement in the
basal inferior wall most probably represents a small embolic
non-transmural infarct.

## 2022-07-27 ENCOUNTER — Other Ambulatory Visit (HOSPITAL_COMMUNITY): Payer: Self-pay

## 2022-07-29 DIAGNOSIS — Z0000 Encounter for general adult medical examination without abnormal findings: Secondary | ICD-10-CM | POA: Diagnosis not present

## 2022-07-29 DIAGNOSIS — Z8742 Personal history of other diseases of the female genital tract: Secondary | ICD-10-CM | POA: Diagnosis not present

## 2022-07-29 DIAGNOSIS — Z01419 Encounter for gynecological examination (general) (routine) without abnormal findings: Secondary | ICD-10-CM | POA: Diagnosis not present

## 2022-08-10 ENCOUNTER — Ambulatory Visit (INDEPENDENT_AMBULATORY_CARE_PROVIDER_SITE_OTHER): Payer: 59 | Admitting: Family Medicine

## 2022-08-10 ENCOUNTER — Encounter (INDEPENDENT_AMBULATORY_CARE_PROVIDER_SITE_OTHER): Payer: Self-pay | Admitting: Family Medicine

## 2022-08-10 VITALS — BP 104/71 | HR 88 | Temp 98.1°F | Ht 65.0 in | Wt 250.0 lb

## 2022-08-10 DIAGNOSIS — F3289 Other specified depressive episodes: Secondary | ICD-10-CM

## 2022-08-10 DIAGNOSIS — E559 Vitamin D deficiency, unspecified: Secondary | ICD-10-CM | POA: Diagnosis not present

## 2022-08-10 DIAGNOSIS — Z6841 Body Mass Index (BMI) 40.0 and over, adult: Secondary | ICD-10-CM | POA: Diagnosis not present

## 2022-08-10 NOTE — Assessment & Plan Note (Signed)
Has not been taking Wellbutrin for the past month Denies feeling any different with mood, cravings or emotional eating tendencies  Will keep her off Wellbutrin for now and focus on stress reduction, proper sleep, good nutrition and more consistent physical activity.  Has a good support system

## 2022-08-10 NOTE — Progress Notes (Signed)
Office: [phone]  /  Fax: [place]  Starting Date: 01/05/22  Starting Weight: 255lb   Weight Lost Since Last Visit: 0lb   Vitals Temp: 98.1 F (36.7 C) BP: 104/71 Pulse Rate: 88 SpO2: 97 %   Body Composition  Body Fat %: 45.6 % Fat Mass (lbs): 114 lbs Muscle Mass (lbs): 129.2 lbs Total Body Water (lbs): 90.4 lbs Visceral Fat Rating : 12   HPI  Chief Complaint: OBESITY  Brooke Casey is here to discuss her progress with her obesity treatment plan. She is on the the Category 3 Plan and states she is following her eating plan approximately 40 % of the time. She states she is not exercising.    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 Body mass index is 41.6 kg/m.  General: She is overweight, cooperative, alert, well developed, and in no acute distress. PSYCH: Has normal mood, affect and thought process.   Lungs: Normal breathing effort, no conversational dyspnea.   ASSESSMENT AND PLAN  TREATMENT PLAN FOR OBESITY:  Recommended Dietary Goals  Brooke Casey is currently in the action stage of change. As such, her goal is to continue weight management plan. She has agreed to practicing portion control and making smarter food choices, such as increasing vegetables and decreasing simple carbohydrates.  Behavioral Intervention  We discussed the following Behavioral Modification Strategies today: increasing lean protein intake, increasing vegetables,  increasing water intake, work on meal planning and easy cooking plans, decreasing eating out, consumption of processed foods, and making healthy choices when eating convenient foods, work on managing stress, creating time for self-care and relaxation measures, and avoiding temptations and identifying enticing environmental cues.  Additional resources provided today: NA  Recommended Physical Activity Goals  Brooke Casey has been advised to work up to 150 minutes of moderate intensity aerobic activity a week and strengthening exercises 2-3 times per week for cardiovascular health, weight loss maintenance and preservation of muscle mass.   She has agreed to Will begin regular aerobic exercise 30 minutes, 3 times per week. Chosen activity walking or home exercise.  Pharmacotherapy changes for the treatment of obesity: none  ASSOCIATED CONDITIONS ADDRESSED TODAY  Vitamin D deficiency Assessment & Plan: Had Vitamin D level rechecked by OB gyn - still low. OB refilled her RX ergocalciferol 50,0000 IU weekly Energy level is low  Plan to recheck level in 3 mos   Morbid obesity ([place])  BMI 40.0-44.9, adult ([place])  Other depression with emotional eating Assessment & Plan: Has not been taking Wellbutrin for the past month Denies feeling any different with mood, cravings or emotional eating tendencies  Will keep her off Wellbutrin for now and focus on stress reduction, proper sleep, good nutrition and more consistent physical activity.  Has a good support system       She was informed of the importance of frequent follow up visits to maximize her success with intensive lifestyle modifications for her multiple health conditions.  ATTESTASTION STATEMENTS:  Reviewed by clinician on day of visit: allergies, medications, problem list, medical history, surgical history, family history, social history, and previous encounter notes pertinent to obesity diagnosis.   I have personally spent 30 minutes  total time today in preparation, patient care, nutritional counseling and documentation for this visit, including the following: review of clinical lab tests; review of medical tests/procedures/services.      Dell Ponto, DO DABFM, DABOM [facility] [address] [place], [place] [address] [phone]

## 2022-08-10 NOTE — Assessment & Plan Note (Signed)
Had Vitamin D level rechecked by OB gyn - still low. OB refilled her RX ergocalciferol 50,0000 IU weekly Energy level is low  Plan to recheck level in 3 mos

## 2022-09-09 DIAGNOSIS — B3731 Acute candidiasis of vulva and vagina: Secondary | ICD-10-CM | POA: Diagnosis not present

## 2022-09-09 DIAGNOSIS — N898 Other specified noninflammatory disorders of vagina: Secondary | ICD-10-CM | POA: Diagnosis not present

## 2022-09-13 ENCOUNTER — Encounter (INDEPENDENT_AMBULATORY_CARE_PROVIDER_SITE_OTHER): Payer: Self-pay | Admitting: Family Medicine

## 2022-09-13 ENCOUNTER — Ambulatory Visit (INDEPENDENT_AMBULATORY_CARE_PROVIDER_SITE_OTHER): Payer: 59 | Admitting: Family Medicine

## 2022-09-13 VITALS — BP 116/78 | HR 77 | Temp 98.0°F | Ht 65.0 in | Wt 251.0 lb

## 2022-09-13 DIAGNOSIS — E559 Vitamin D deficiency, unspecified: Secondary | ICD-10-CM

## 2022-09-13 DIAGNOSIS — Z566 Other physical and mental strain related to work: Secondary | ICD-10-CM | POA: Diagnosis not present

## 2022-09-13 DIAGNOSIS — Z6841 Body Mass Index (BMI) 40.0 and over, adult: Secondary | ICD-10-CM | POA: Diagnosis not present

## 2022-09-13 MED ORDER — VITAMIN D (ERGOCALCIFEROL) 1.25 MG (50000 UNIT) PO CAPS: 50000.0000 [IU] | ORAL_CAPSULE | ORAL | 0 refills | Status: DC

## 2022-09-13 NOTE — Assessment & Plan Note (Signed)
Stress at work continues to be a barrier to her progress with weight reduction but she has done better with bringing healthier food items to work.  Time with working 2 jobs has been a barrier to exercise.  Stress at work has also contributed to more alcohol consumption hindering her weight loss.  Plan:  begin working on self care, stress reduction, easy healthy meals, mindful eating and scheduling in workouts/ outdoor walks/ play with kids for more exercise.  Reduce alcohol intake and improve sleep at night.

## 2022-09-13 NOTE — Progress Notes (Signed)
Office: [phone]  /  Fax: [phone]  WEIGHT SUMMARY AND BIOMETRICS  Starting Date: 01/05/22  Starting Weight: 255lb   Weight Lost Since Last Visit: 0   Vitals Temp: 98 F (36.7 C) BP: 116/78 Pulse Rate: 77 SpO2: 97 %   Body Composition  Body Fat %: 46 % Fat Mass (lbs): 115.8 lbs Muscle Mass (lbs): 129 lbs Total Body Water (lbs): 91.6 lbs Visceral Fat Rating : 12   HPI  Chief Complaint: OBESITY  Brooke Casey is here to discuss her progress with her obesity treatment plan. She is on the the Category 2 Plan and states she is following her eating plan approximately 60 % of the time. She states she is exercising 0 minutes 0 times per week.   Interval History:  Since last office visit she is up 1 lb This gives her a net weight loss of 4 lb in 8 mos She is getting used to her new job She is traveling a lot for her job.  She hasn't had as much time for exercise.  She is still working a 2nd job.  She drank more ETOH to manage stress. She is doing better with bringing breakfast and lunch to work.   Denies problems with hunger or cravings She would like to get back to the gym  Pharmacotherapy: none  PHYSICAL EXAM:  Blood pressure 116/78, pulse 77, temperature 98 F (36.7 C), height  (1.651 m), weight 251 lb (113.9 kg), SpO2 97 %, unknown if currently breastfeeding. Body mass index is 41.77 kg/m.  General: She is overweight, cooperative, alert, well developed, and in no acute distress. PSYCH: Has normal mood, affect and thought process.   Lungs: Normal breathing effort, no conversational dyspnea.   ASSESSMENT AND PLAN  TREATMENT PLAN FOR OBESITY:  Recommended Dietary Goals  Brooke Casey is currently in the action stage of change. As such, her goal is to continue weight management plan. She has agreed to the Category 2 Plan.  Behavioral Intervention  We discussed the following Behavioral Modification Strategies today: increasing lean protein intake, increasing  vegetables, increasing fiber rich foods, avoiding skipping meals, increasing water intake, work on meal planning and preparation, reading food labels , and work on managing stress, creating time for self-care and relaxation measures.  Additional resources provided today: NA  Recommended Physical Activity Goals  Brooke Casey has been advised to work up to 150 minutes of moderate intensity aerobic activity a week and strengthening exercises 2-3 times per week for cardiovascular health, weight loss maintenance and preservation of muscle mass.   She has agreed to [employer] strengthening exercises with a goal of 2-3 sessions a week   Pharmacotherapy changes for the treatment of obesity: none  ASSOCIATED CONDITIONS ADDRESSED TODAY  Vitamin D deficiency Assessment & Plan: Last vitamin D Lab Results  Component Value Date   VD25OH 29.8 (L) 01/05/2022   Doing well on RX vitamin D 50,000 IU weekly.  Her level was rechecked 07/29/22 at 22.5.  we discussed a goal of 50-70 and encouraged compliance taking it weekly.  Plan:  continue RX vitamin D 50,000 IU weekly.  Recheck level in 3 mos.   Orders: -     Vitamin D (Ergocalciferol); Take 1 capsule (50,000 Units total) by mouth every 7 (seven) days.  Dispense: 12 capsule; Refill: 0  Morbid obesity  BMI 40.0-44.9, adult  Stress at work Assessment & Plan: Stress at work continues to be a barrier to her progress with weight reduction but she has done better  with bringing healthier food items to work.  Time with working 2 jobs has been a barrier to exercise.  Stress at work has also contributed to more alcohol consumption hindering her weight loss.  Plan:  begin working on self care, stress reduction, easy healthy meals, mindful eating and scheduling in workouts/ outdoor walks/ play with kids for more exercise.  Reduce alcohol intake and improve sleep at night.        She was informed of the importance of frequent follow up visits to maximize her success  with intensive lifestyle modifications for her multiple health conditions.   ATTESTASTION STATEMENTS:  Reviewed by clinician on day of visit: allergies, medications, problem list, medical history, surgical history, family history, social history, and previous encounter notes pertinent to obesity diagnosis.   I have personally spent 30 minutes total time today in preparation, patient care, nutritional counseling and documentation for this visit, including the following: review of clinical lab tests; review of medical tests/procedures/services.      Glennis Brink, DO DABFM, DABOM [facility] [address] [place], [place] [address] [phone]

## 2022-09-13 NOTE — Assessment & Plan Note (Signed)
Last vitamin D Lab Results  Component Value Date   VD25OH 29.8 (L) 01/05/2022   Doing well on RX vitamin D 50,000 IU weekly.  Her level was rechecked 07/29/22 at 22.5.  we discussed a goal of 50-70 and encouraged compliance taking it weekly.  Plan:  continue RX vitamin D 50,000 IU weekly.  Recheck level in 3 mos.

## 2022-11-09 ENCOUNTER — Encounter (INDEPENDENT_AMBULATORY_CARE_PROVIDER_SITE_OTHER): Payer: Self-pay

## 2022-11-09 ENCOUNTER — Ambulatory Visit (INDEPENDENT_AMBULATORY_CARE_PROVIDER_SITE_OTHER): Payer: 59 | Admitting: Family Medicine

## 2022-12-14 ENCOUNTER — Ambulatory Visit (INDEPENDENT_AMBULATORY_CARE_PROVIDER_SITE_OTHER): Payer: 59 | Admitting: Family Medicine

## 2022-12-14 VITALS — BP 107/78 | HR 62 | Temp 98.6°F | Ht 65.0 in | Wt 250.0 lb

## 2022-12-14 DIAGNOSIS — E7849 Other hyperlipidemia: Secondary | ICD-10-CM

## 2022-12-14 DIAGNOSIS — Z566 Other physical and mental strain related to work: Secondary | ICD-10-CM | POA: Diagnosis not present

## 2022-12-14 DIAGNOSIS — K760 Fatty (change of) liver, not elsewhere classified: Secondary | ICD-10-CM | POA: Diagnosis not present

## 2022-12-14 DIAGNOSIS — Z6841 Body Mass Index (BMI) 40.0 and over, adult: Secondary | ICD-10-CM | POA: Diagnosis not present

## 2022-12-14 DIAGNOSIS — R5383 Other fatigue: Secondary | ICD-10-CM

## 2022-12-14 DIAGNOSIS — E559 Vitamin D deficiency, unspecified: Secondary | ICD-10-CM | POA: Diagnosis not present

## 2022-12-14 MED ORDER — QSYMIA 3.75-23 MG PO CP24: 1.0000 | ORAL_CAPSULE | Freq: Every day | ORAL | 0 refills | Status: DC

## 2022-12-14 MED ORDER — VITAMIN D (ERGOCALCIFEROL) 1.25 MG (50000 UNIT) PO CAPS: 50000.0000 [IU] | ORAL_CAPSULE | ORAL | 0 refills | Status: DC

## 2022-12-14 NOTE — Assessment & Plan Note (Signed)
Fatty liver confirmed on abdominal ultrasound 09/23/2019, reviewed today.  This correlates with her mild elevation in ALT levels.  She is at risk for progression of fatty liver disease with a high visceral fat rating and a BMI of 41.  She is working on a low saturated fat/low sugar diet and weight reduction.  We discussed active plan for weight reduction as the primary treatment plan for fatty liver disease.

## 2022-12-14 NOTE — Assessment & Plan Note (Signed)
Stress at work has been a factor with her lack of weight loss over the past year.  She will be going back to school and working full-time.  Lack of planning, high stress with emotional eating have been factors and her progress.  We discussed planning out some healthy easy meal and snack options for her.  Will work on Haematologist out time to avoid skipping meals and finding easy ways to get protein and at mealtime.  Aim for 7 to 8 hours of sleep at night.

## 2022-12-14 NOTE — Assessment & Plan Note (Signed)
We reviewed patient's overall progress in the past 11 months of medically supervised weight management, down 5 pounds in total.  Stress at work and lack of meal planning have been factors and her lack of progress.  She is also struggled with her weight throughout adulthood.  She has considered going the route of bariatric surgery which we discussed the difference between vertical sleeve gastrectomy and Roux-en-Y gastric bypass surgery today.  She has not used any FDA approved antiobesity medication.  She failed to see results using Wellbutrin for emotional eating.  She lacks insurance coverage for use of a GLP-1 receptor agonist.  She is motivated to resume her meal plan.  Copy of the category 3 meal plan provided today. She is a good candidate for use of Qsymia.  Begin Qsymia 3.75/23 mg once daily with breakfast.  Reviewed potential adverse side effects.  Blood pressure and heart rate are within normal limits.  Avoid pregnancy due to risk of teratogenic side effects.  She agrees to using birth control, has Nexplanon.  PDMP reviewed.  Informed consent signed.

## 2022-12-14 NOTE — Assessment & Plan Note (Signed)
Last vitamin D Lab Results  Component Value Date   VD25OH 29.8 (L) 01/05/2022   She has been taking vitamin D 50,000 IU once weekly.  She did run out of her prescription as she was overdue for her appointment.  Her energy levels remain fairly low.  Recheck vitamin D level today with a target goal 50-70.

## 2022-12-14 NOTE — Progress Notes (Signed)
Office: [phone]  /  Fax: [phone]  WEIGHT SUMMARY AND BIOMETRICS  Starting Date: 01/05/22  Starting Weight: 255lb   Weight Lost Since Last Visit: 1lb   Vitals Temp: 98.6 F (37 C) BP: 107/78 Pulse Rate: 62 SpO2: 98 %   Body Composition  Body Fat %: 45.2 % Fat Mass (lbs): 113 lbs Muscle Mass (lbs): 130.2 lbs Total Body Water (lbs): 89.4 lbs Visceral Fat Rating : 11     HPI  Chief Complaint: OBESITY  Brooke Casey is here to discuss her progress with her obesity treatment plan. She is on the the Category 2 Plan and states she is following her eating plan approximately 20 % of the time. She states she is exercising 0 minutes 0 times per week.   Interval History:  Since last office visit she is down 1 lb She admits to being off of her meal plan She is up 1.2 pounds of muscle mass and down 2.2 pounds of body fat in the past 3 months She has job related stress and has struggled to make good choices or plan meals She is sitting a lot at work and gets some activity in on the weekends She is going back to school and will be working full-time Her husband is supportive, helping take care of her 2 young boys  Pharmacotherapy: none  PHYSICAL EXAM:  Blood pressure 107/78, pulse 62, temperature 98.6 F (37 C), height 5\' 5"  (1.651 m), weight 250 lb (113.4 kg), SpO2 98%, unknown if currently breastfeeding. Body mass index is 41.6 kg/m.  General: She is overweight, cooperative, alert, well developed, and in no acute distress. PSYCH: Has normal mood, affect and thought process.   Lungs: Normal breathing effort, no conversational dyspnea.   ASSESSMENT AND PLAN  TREATMENT PLAN FOR OBESITY:  Recommended Dietary Goals  Tylan is currently in the action stage of change. As such, her goal is to continue weight management plan. She has agreed to the Category 3 Plan.  Behavioral Intervention  We discussed the following Behavioral Modification Strategies today: increasing  lean protein intake, decreasing simple carbohydrates , increasing vegetables, increasing lower glycemic fruits, increasing water intake, keeping healthy foods at home, work on managing stress, creating time for self-care and relaxation measures, avoiding temptations and identifying enticing environmental cues, continue to practice mindfulness when eating, and planning for success. -Reviewed easy premade dinner options given her lack of planning, lack of time, family eating foods at dinner that are not on her plan -Bring healthy snacks to work and school  Additional resources provided today: NA  Recommended Physical Activity Goals  Trang has been advised to work up to 150 minutes of moderate intensity aerobic activity a week and strengthening exercises 2-3 times per week for cardiovascular health, weight loss maintenance and preservation of muscle mass.   She has agreed to Think about ways to increase daily physical activity and overcoming barriers to exercise  Pharmacotherapy changes for the treatment of obesity: Begin Qsymia 3.75/23 mg once daily with breakfast, reviewed potential adverse side effects, informed consent signed, PDMP reviewed.  ASSOCIATED CONDITIONS ADDRESSED TODAY  Stress at work Assessment & Plan: Stress at work has been a factor with her lack of weight loss over the past year.  She will be going back to school and working full-time.  Lack of planning, high stress with emotional eating have been factors and her progress.  We discussed planning out some healthy easy meal and snack options for her.  Will work on Haematologist out  time to avoid skipping meals and finding easy ways to get protein and at mealtime.  Aim for 7 to 8 hours of sleep at night.   Morbid obesity (HCC) with starting BMI 42 Assessment & Plan: We reviewed patient's overall progress in the past 11 months of medically supervised weight management, down 5 pounds in total.  Stress at work and lack of meal planning  have been factors and her lack of progress.  She is also struggled with her weight throughout adulthood.  She has considered going the route of bariatric surgery which we discussed the difference between vertical sleeve gastrectomy and Roux-en-Y gastric bypass surgery today.  She has not used any FDA approved antiobesity medication.  She failed to see results using Wellbutrin for emotional eating.  She lacks insurance coverage for use of a GLP-1 receptor agonist.  She is motivated to resume her meal plan.  Copy of the category 3 meal plan provided today. She is a good candidate for use of Qsymia.  Begin Qsymia 3.75/23 mg once daily with breakfast.  Reviewed potential adverse side effects.  Blood pressure and heart rate are within normal limits.  Avoid pregnancy due to risk of teratogenic side effects.  She agrees to using birth control, has Nexplanon.  PDMP reviewed.  Informed consent signed.  Orders: -     Qsymia; Take 1 capsule by mouth daily.  Dispense: 30 capsule; Refill: 0  Vitamin D deficiency Assessment & Plan: Last vitamin D Lab Results  Component Value Date   VD25OH 29.8 (L) 01/05/2022   She has been taking vitamin D 50,000 IU once weekly.  She did run out of her prescription as she was overdue for her appointment.  Her energy levels remain fairly low.  Recheck vitamin D level today with a target goal 50-70.  Orders: -     Vitamin D (Ergocalciferol); Take 1 capsule (50,000 Units total) by mouth every 7 (seven) days.  Dispense: 12 capsule; Refill: 0 -     VITAMIN D 25 Hydroxy (Vit-D Deficiency, Fractures)  Other hyperlipidemia -     Lipid panel  Fatty liver Assessment & Plan: Fatty liver confirmed on abdominal ultrasound 09/23/2019, reviewed today.  This correlates with her mild elevation in ALT levels.  She is at risk for progression of fatty liver disease with a high visceral fat rating and a BMI of 41.  She is working on a low saturated fat/low sugar diet and weight  reduction.  We discussed active plan for weight reduction as the primary treatment plan for fatty liver disease.   Other fatigue -     Comprehensive metabolic panel -     Folate -     Vitamin B12 -     CBC -     TSH Rfx on Abnormal to Free T4 -     Hemoglobin A1c -     Insulin, random      She was informed of the importance of frequent follow up visits to maximize her success with intensive lifestyle modifications for her multiple health conditions.   ATTESTASTION STATEMENTS:  Reviewed by clinician on day of visit: allergies, medications, problem list, medical history, surgical history, family history, social history, and previous encounter notes pertinent to obesity diagnosis.   I have personally spent 30 minutes total time today in preparation, patient care, nutritional counseling and documentation for this visit, including the following: review of clinical lab tests; review of medical tests/procedures/services.      Glennis Brink, DO  DABFM, DABOM [facility] [address] [place], [place] [address] [phone]

## 2022-12-16 ENCOUNTER — Telehealth (INDEPENDENT_AMBULATORY_CARE_PROVIDER_SITE_OTHER): Payer: Self-pay | Admitting: Family Medicine

## 2022-12-16 LAB — LIPID PANEL
Chol/HDL Ratio: 5.3 ratio — ABNORMAL HIGH (ref 0.0–4.4)
Cholesterol, Total: 218 mg/dL — ABNORMAL HIGH (ref 100–199)
HDL: 41 mg/dL (ref >39)
LDL Chol Calc (NIH): 165 mg/dL — ABNORMAL HIGH (ref 0–99)
Triglycerides: 70 mg/dL (ref 0–149)
VLDL Cholesterol Cal: 12 mg/dL (ref 5–40)

## 2022-12-16 LAB — COMPREHENSIVE METABOLIC PANEL
ALT: 22 IU/L (ref 0–32)
AST: 17 IU/L (ref 0–40)
Albumin: 4.2 g/dL (ref 4.0–5.0)
Alkaline Phosphatase: 78 IU/L (ref 44–121)
BUN/Creatinine Ratio: 9 (ref 9–23)
BUN: 7 mg/dL (ref 6–20)
Bilirubin Total: 0.5 mg/dL (ref 0.0–1.2)
CO2: 23 mmol/L (ref 20–29)
Calcium: 9.5 mg/dL (ref 8.7–10.2)
Chloride: 102 mmol/L (ref 96–106)
Creatinine, Ser: 0.76 mg/dL (ref 0.57–1.00)
Globulin, Total: 3.1 g/dL (ref 1.5–4.5)
Glucose: 89 mg/dL (ref 70–99)
Potassium: 4.4 mmol/L (ref 3.5–5.2)
Sodium: 138 mmol/L (ref 134–144)
Total Protein: 7.3 g/dL (ref 6.0–8.5)
eGFR: 108 mL/min/{1.73_m2} (ref >59)

## 2022-12-16 LAB — CBC
Hematocrit: 41.5 % (ref 34.0–46.6)
Hemoglobin: 14.1 g/dL (ref 11.1–15.9)
MCH: 31.1 pg (ref 26.6–33.0)
MCHC: 34 g/dL (ref 31.5–35.7)
MCV: 92 fL (ref 79–97)
Platelets: 285 10*3/uL (ref 150–450)
RBC: 4.53 x10E6/uL (ref 3.77–5.28)
RDW: 12.8 % (ref 11.7–15.4)
WBC: 4.2 10*3/uL (ref 3.4–10.8)

## 2022-12-16 LAB — VITAMIN B12: Vitamin B-12: 463 pg/mL (ref 232–1245)

## 2022-12-16 LAB — TSH RFX ON ABNORMAL TO FREE T4: TSH: 2.15 u[IU]/mL (ref 0.450–4.500)

## 2022-12-16 LAB — HEMOGLOBIN A1C
Est. average glucose Bld gHb Est-mCnc: 117 mg/dL
Hgb A1c MFr Bld: 5.7 % — ABNORMAL HIGH (ref 4.8–5.6)

## 2022-12-16 LAB — FOLATE: Folate: 11.3 ng/mL (ref >3.0)

## 2022-12-16 LAB — INSULIN, RANDOM: INSULIN: 19 u[IU]/mL (ref 2.6–24.9)

## 2022-12-16 LAB — VITAMIN D 25 HYDROXY (VIT D DEFICIENCY, FRACTURES): Vit D, 25-Hydroxy: 36.1 ng/mL (ref 30.0–100.0)

## 2022-12-16 NOTE — Telephone Encounter (Signed)
Prior authorization done via cover my meds for Qsymia. Waiting on determination.

## 2022-12-20 NOTE — Telephone Encounter (Signed)
QSYMIA Capsule ER 24HR [id number] Medicaid 12/16/2022 30 units WE DENIED: Service Description Code 1 Code 2 Plan Denied Dates Denied  Amount [facility] Capsule ER 24HR [id number] Medicaid 12/16/2022 30 units COMMENTS:  QSYMIA Capsule ER 24HR is an excluded product/service. The health plan does not cover Weight Loss Medications.

## 2023-01-11 ENCOUNTER — Encounter (INDEPENDENT_AMBULATORY_CARE_PROVIDER_SITE_OTHER): Payer: Self-pay | Admitting: Family Medicine

## 2023-01-11 ENCOUNTER — Ambulatory Visit (INDEPENDENT_AMBULATORY_CARE_PROVIDER_SITE_OTHER): Payer: 59 | Admitting: Family Medicine

## 2023-01-11 VITALS — BP 122/91 | HR 68 | Temp 98.6°F | Ht 65.0 in | Wt 249.0 lb

## 2023-01-11 DIAGNOSIS — E559 Vitamin D deficiency, unspecified: Secondary | ICD-10-CM

## 2023-01-11 DIAGNOSIS — R632 Polyphagia: Secondary | ICD-10-CM | POA: Diagnosis not present

## 2023-01-11 DIAGNOSIS — Z6841 Body Mass Index (BMI) 40.0 and over, adult: Secondary | ICD-10-CM

## 2023-01-11 DIAGNOSIS — R030 Elevated blood-pressure reading, without diagnosis of hypertension: Secondary | ICD-10-CM | POA: Diagnosis not present

## 2023-01-11 DIAGNOSIS — Z566 Other physical and mental strain related to work: Secondary | ICD-10-CM | POA: Diagnosis not present

## 2023-01-11 MED ORDER — VITAMIN D (ERGOCALCIFEROL) 1.25 MG (50000 UNIT) PO CAPS: 50000.0000 [IU] | ORAL_CAPSULE | ORAL | 0 refills | Status: DC

## 2023-01-11 MED ORDER — WEGOVY 0.25 MG/0.5ML ~~LOC~~ SOAJ: 0.2500 mg | SUBCUTANEOUS | 0 refills | Status: DC

## 2023-01-11 NOTE — Progress Notes (Signed)
Office: [phone]  /  Fax: [phone]  WEIGHT SUMMARY AND BIOMETRICS  Starting Date: 01/05/22  Starting Weight: 255lb   Weight Lost Since Last Visit: 1lb   Vitals Temp: 98.6 F (37 C) BP: (!) 122/91 Pulse Rate: 68 SpO2: 96 %   Body Composition  Body Fat %: 45.6 % Fat Mass (lbs): 114 lbs Muscle Mass (lbs): 129 lbs Total Body Water (lbs): 90.6 lbs Visceral Fat Rating : 12     HPI  Chief Complaint: OBESITY  Brooke Casey is here to discuss her progress with her obesity treatment plan. She is on the the [facility] and states she is following her eating plan approximately 75 % of the time. She states she is exercising 30 minutes 3 times per week.   Interval History:  Since last office visit she is down 1 lb She is down 1.2 lb of muscle and is up 1 lb of body fat in the past month She is doing well on pescatarian plan She has a net weight loss of 6 lb in the past year She is getting ready to go back to school and will balance this with work She may skip lunch on work days She did not start Qsymia due to lack of coverage She has cut out ETOH  Pharmacotherapy: none  PHYSICAL EXAM:  Blood pressure (!) 122/91, pulse 68, temperature 98.6 F (37 C), height 5\' 5"  (1.651 m), weight 249 lb (112.9 kg), SpO2 96%, unknown if currently breastfeeding. Body mass index is 41.44 kg/m.  General: She is overweight, cooperative, alert, well developed, and in no acute distress. PSYCH: Has normal mood, affect and thought process.   Lungs: Normal breathing effort, no conversational dyspnea.   ASSESSMENT AND PLAN  TREATMENT PLAN FOR OBESITY:  Recommended Dietary Goals  Geet is currently in the action stage of change. As such, her goal is to continue weight management plan. She has agreed to the [employer].  Behavioral Intervention  We discussed the following Behavioral Modification Strategies today: increasing lean protein intake, decreasing simple carbohydrates  , increasing vegetables, increasing lower glycemic fruits, increasing fiber rich foods, avoiding skipping meals, increasing water intake, continue to work on implementation of reduced calorie nutritional plan, continue to practice mindfulness when eating, planning for success, and better snacking choices.  Additional resources provided today: NA  Recommended Physical Activity Goals  Tirsa has been advised to work up to 150 minutes of moderate intensity aerobic activity a week and strengthening exercises 2-3 times per week for cardiovascular health, weight loss maintenance and preservation of muscle mass.   She has agreed to Increase the intensity, frequency or duration of strengthening exercises  - track daily steps  Pharmacotherapy changes for the treatment of obesity: begin Wegovy 0.25 mg once a week injection  ASSOCIATED CONDITIONS ADDRESSED TODAY  Polyphagia Assessment & Plan: Polyphasia has been an issue with her lack of weight loss over the past year of medically supervised weight management.  She was unable to start Qsymia due to lack of insurance coverage.  She is opposed to the idea of weight loss surgery though her BMI remains over 40.  She has been working on her prescribed dietary plan, eating on a schedule and incorporating more lean protein with meals.  She is a good candidate for use of Wegovy once weekly injection.  We discussed mechanism of action and potential adverse side effects.  Avoid pregnancy while on Wegovy, currently using Nexplanon.  Patient denies a personal or family history of pancreatitis,  medullary thyroid carcinoma or multiple endocrine neoplasia type II. Recommend reviewing pen training video online.   Orders: -     Wegovy; Inject 0.25 mg into the skin once a week.  Dispense: 2 mL; Refill: 0  Vitamin D deficiency Assessment & Plan: Last vitamin D Lab Results  Component Value Date   VD25OH 36.1 12/14/2022   She is currently on vitamin D 50,000 IU  once weekly.  Her energy level is starting to improve.  She denies adverse side effects.  Will continue vitamin D 50,000 IU once weekly.  Recheck level in 3 months.  Orders: -     Vitamin D (Ergocalciferol); Take 1 capsule (50,000 Units total) by mouth every 7 (seven) days.  Dispense: 12 capsule; Refill: 0  Morbid obesity (HCC) with starting BMI 42 -     Wegovy; Inject 0.25 mg into the skin once a week.  Dispense: 2 mL; Refill: 0  BMI 40.0-44.9, adult (HCC)  Stress at work Assessment & Plan: Stress at work remains high.  She is working, going back to school and has young kids.  She lacks a good support system.  She is more mindful of her food choices and has done better when planning out meals and carrying healthy food to work.  We discussed easy grab and go options for work.  Continue working on proper sleep at night, good nutrition and mindful eating.   Elevated blood pressure reading Assessment & Plan: Blood pressure was mildly elevated today with a diastolic reading of 91.  She denies chest pain or headache.  Her previous blood pressure readings have been within normal limits.  Will closely monitor blood pressure readings at subsequent visits and look for improvements with weight reduction.  Avoid high sodium foods and drinks.       She was informed of the importance of frequent follow up visits to maximize her success with intensive lifestyle modifications for her multiple health conditions.   ATTESTASTION STATEMENTS:  Reviewed by clinician on day of visit: allergies, medications, problem list, medical history, surgical history, family history, social history, and previous encounter notes pertinent to obesity diagnosis.   I have personally spent 30 minutes total time today in preparation, patient care, nutritional counseling and documentation for this visit, including the following: review of clinical lab tests; review of medical tests/procedures/services.      Glennis Brink,  DO DABFM, DABOM [facility] [address] [place], [place] [address] [phone]

## 2023-01-11 NOTE — Assessment & Plan Note (Signed)
Blood pressure was mildly elevated today with a diastolic reading of 91.  She denies chest pain or headache.  Her previous blood pressure readings have been within normal limits.  Will closely monitor blood pressure readings at subsequent visits and look for improvements with weight reduction.  Avoid high sodium foods and drinks.

## 2023-01-11 NOTE — Assessment & Plan Note (Signed)
Stress at work remains high.  She is working, going back to school and has young kids.  She lacks a good support system.  She is more mindful of her food choices and has done better when planning out meals and carrying healthy food to work.  We discussed easy grab and go options for work.  Continue working on proper sleep at night, good nutrition and mindful eating.

## 2023-01-11 NOTE — Assessment & Plan Note (Signed)
Last vitamin D Lab Results  Component Value Date   VD25OH 36.1 12/14/2022   She is currently on vitamin D 50,000 IU once weekly.  Her energy level is starting to improve.  She denies adverse side effects.  Will continue vitamin D 50,000 IU once weekly.  Recheck level in 3 months.

## 2023-01-11 NOTE — Assessment & Plan Note (Signed)
Polyphasia has been an issue with her lack of weight loss over the past year of medically supervised weight management.  She was unable to start Qsymia due to lack of insurance coverage.  She is opposed to the idea of weight loss surgery though her BMI remains over 40.  She has been working on her prescribed dietary plan, eating on a schedule and incorporating more lean protein with meals.  She is a good candidate for use of Wegovy once weekly injection.  We discussed mechanism of action and potential adverse side effects.  Avoid pregnancy while on Wegovy, currently using Nexplanon.  Patient denies a personal or family history of pancreatitis, medullary thyroid carcinoma or multiple endocrine neoplasia type II. Recommend reviewing pen training video online.

## 2023-01-18 ENCOUNTER — Telehealth (INDEPENDENT_AMBULATORY_CARE_PROVIDER_SITE_OTHER): Payer: Self-pay | Admitting: Family Medicine

## 2023-01-18 NOTE — Telephone Encounter (Signed)
Prior authorization done via cover my meds for patients. Wegovy. Waiting on determination.  

## 2023-01-26 DIAGNOSIS — N644 Mastodynia: Secondary | ICD-10-CM | POA: Diagnosis not present

## 2023-01-26 DIAGNOSIS — Z3040 Encounter for surveillance of contraceptives, unspecified: Secondary | ICD-10-CM | POA: Diagnosis not present

## 2023-02-08 ENCOUNTER — Ambulatory Visit (INDEPENDENT_AMBULATORY_CARE_PROVIDER_SITE_OTHER): Payer: 59 | Admitting: Internal Medicine

## 2023-02-15 DIAGNOSIS — Z309 Encounter for contraceptive management, unspecified: Secondary | ICD-10-CM | POA: Diagnosis not present

## 2023-02-15 DIAGNOSIS — Z3046 Encounter for surveillance of implantable subdermal contraceptive: Secondary | ICD-10-CM | POA: Diagnosis not present

## 2023-02-17 NOTE — Telephone Encounter (Signed)
Wegovy was denied.   Patient notified.

## 2023-02-18 DIAGNOSIS — H5213 Myopia, bilateral: Secondary | ICD-10-CM | POA: Diagnosis not present

## 2023-05-12 ENCOUNTER — Other Ambulatory Visit (HOSPITAL_COMMUNITY): Payer: Self-pay

## 2023-05-12 ENCOUNTER — Encounter: Payer: Self-pay | Admitting: Adult Health

## 2023-05-12 ENCOUNTER — Ambulatory Visit: Payer: 59 | Admitting: Adult Health

## 2023-05-12 VITALS — BP 127/78 | HR 69 | Temp 98.4°F | Resp 18 | Ht 65.0 in | Wt 257.6 lb

## 2023-05-12 DIAGNOSIS — Z7689 Persons encountering health services in other specified circumstances: Secondary | ICD-10-CM | POA: Diagnosis not present

## 2023-05-12 DIAGNOSIS — Z124 Encounter for screening for malignant neoplasm of cervix: Secondary | ICD-10-CM

## 2023-05-12 DIAGNOSIS — L309 Dermatitis, unspecified: Secondary | ICD-10-CM | POA: Diagnosis not present

## 2023-05-12 DIAGNOSIS — E7800 Pure hypercholesterolemia, unspecified: Secondary | ICD-10-CM | POA: Diagnosis not present

## 2023-05-12 DIAGNOSIS — E559 Vitamin D deficiency, unspecified: Secondary | ICD-10-CM | POA: Diagnosis not present

## 2023-05-12 DIAGNOSIS — F32A Depression, unspecified: Secondary | ICD-10-CM | POA: Diagnosis not present

## 2023-05-12 DIAGNOSIS — R7303 Prediabetes: Secondary | ICD-10-CM

## 2023-05-12 MED ORDER — LEVOCETIRIZINE DIHYDROCHLORIDE 5 MG PO TABS: 5.0000 mg | ORAL_TABLET | Freq: Every day | ORAL | Status: DC | PRN

## 2023-05-12 MED ORDER — TRIAMCINOLONE ACETONIDE 0.1 % EX OINT
TOPICAL_OINTMENT | Freq: Two times a day (BID) | CUTANEOUS | 3 refills | Status: DC | PRN
  Filled 2023-05-12: qty 30, 10d supply, fill #0

## 2023-05-12 MED ORDER — TRIAMCINOLONE ACETONIDE 0.1 % EX OINT
1.0000 | TOPICAL_OINTMENT | Freq: Two times a day (BID) | CUTANEOUS | 2 refills | Status: DC | PRN
  Filled 2023-05-12: qty 80, 40d supply, fill #0

## 2023-05-12 NOTE — Patient Instructions (Signed)

## 2023-05-12 NOTE — Progress Notes (Signed)
[facility] clinic  Provider:  Kenard Gower DNP  Code Status:  Full Code  Goals of Care:     03/02/2022    8:02 PM  Advanced Directives  Does Patient Have a Medical Advance Directive? No  Would patient like information on creating a medical advance directive? No - Patient declined     Chief Complaint  Patient presents with   Establish Care    NEW PATIENT    HPI: Patient is a 32 y.o. female seen today to establish care with [facility]. She is married with 2 sons (6 and 4 Y/O). She has a PMH of HPV. She has a brother, 104 Y/O, healthy. Her father is a patient of [facility] as well. She does not eat pork and shellfish. She has an associate degree and works as a Probation officer at [employer]. She is currently enrolled at [employer] with [employer] course. She rarely exercise. She does not smoke but drinks 1X/week        PHQ-9 score was 8, ranging as mild depression. She declines medication treatment nor referral to psych.  Past Medical History:  Diagnosis Date   Allergy    Anxiety    Back pain    Depression    Eczema    HSV (herpes simplex virus) anogenital infection    Hyperlipidemia    Lactose intolerance    Medical history non-contributory    Velamentous insertion of umbilical cord    Vitamin D deficiency     Past Surgical History:  Procedure Laterality Date   CESAREAN SECTION N/A 01/28/2017   Procedure: CESAREAN SECTION;  Surgeon: Myna Hidalgo, DO;  [facility]: [facility];  Service: Obstetrics;  Laterality: N/A;    Allergies  Allergen Reactions   Shellfish Allergy Nausea And Vomiting    Outpatient Encounter Medications as of 05/12/2023  Medication Sig   cholecalciferol (VITAMIN D3) 25 MCG (1000 UNIT) tablet Take 1,000 Units by mouth daily.   EPINEPHrine 0.3 mg/0.3 mL IJ SOAJ injection Inject 0.3 mg into the muscle as needed for anaphylaxis.   hydrOXYzine (ATARAX/VISTARIL) 10 MG tablet Take 1 tablet (10 mg total) by mouth at bedtime as needed for itching.    levocetirizine (XYZAL) 5 MG tablet Take 1 tablet (5 mg total) by mouth every evening.   mometasone (ELOCON) 0.1 % ointment Apply topically 2 (two) times daily as needed (tough, eczema spots). Do not use on the face, neck, armpits or groin area. Do not use more than 3 weeks in a row.   mometasone (ELOCON) 0.1 % ointment Apply topically.   valACYclovir (VALTREX) 1000 MG tablet valacyclovir 1 gram tablet   Vitamin D, Ergocalciferol, (DRISDOL) 1.25 MG (50000 UNIT) CAPS capsule Take 1 capsule (50,000 Units total) by mouth every 7 (seven) days.   albuterol (VENTOLIN HFA) 108 (90 Base) MCG/ACT inhaler Inhale 2 puffs into the lungs every 4 (four) hours as needed for wheezing or shortness of breath (coughing fits). (Patient not taking: Reported on 05/12/2023)   cetirizine (ZYRTEC ALLERGY) 10 MG tablet Take 1 tablet (10 mg total) by mouth daily. (Patient not taking: Reported on 05/12/2023)   HYDROcodone-acetaminophen (NORCO/VICODIN) 5-325 MG tablet Take 1 tablet by mouth every 6 (six) hours as needed for severe pain. (Patient not taking: Reported on 05/12/2023)   montelukast (SINGULAIR) 10 MG tablet Take 1 tablet (10 mg total) by mouth at bedtime. (Patient not taking: Reported on 05/12/2023)   Olopatadine-Mometasone (RYALTRIS) 665-25 MCG/ACT SUSP Place 1-2 sprays into the nose in the morning and  at bedtime. (Patient not taking: Reported on 05/12/2023)   promethazine-dextromethorphan (PROMETHAZINE-DM) 6.25-15 MG/5ML syrup Take 5 mLs by mouth 3 (three) times daily as needed for cough. (Patient not taking: Reported on 05/12/2023)   pseudoephedrine (SUDAFED) 60 MG tablet Take 1 tablet (60 mg total) by mouth every 8 (eight) hours as needed for congestion. (Patient not taking: Reported on 05/12/2023)   Semaglutide-Weight Management (WEGOVY) 0.25 MG/0.5ML SOAJ Inject 0.25 mg into the skin once a week.   triamcinolone ointment (KENALOG) 0.1 % Apply 1 application topically 2 (two) times daily as needed (rash flare). Do  not use on the face, neck, armpits or groin area. Do not use more than 3 weeks in a row. (Patient not taking: Reported on 05/12/2023)   triamcinolone ointment (KENALOG) 0.1 % Apply topically. (Patient not taking: Reported on 05/12/2023)   No facility-administered encounter medications on file as of 05/12/2023.    Review of Systems:  Review of Systems  Constitutional:  Negative for appetite change, chills, fatigue and fever.  HENT:  Negative for congestion, hearing loss, rhinorrhea and sore throat.   Eyes: Negative.   Respiratory:  Negative for cough, shortness of breath and wheezing.   Cardiovascular:  Negative for chest pain, palpitations and leg swelling.  Gastrointestinal:  Negative for abdominal pain, constipation, diarrhea, nausea and vomiting.  Genitourinary:  Negative for dysuria.  Musculoskeletal:  Negative for arthralgias, back pain and myalgias.  Skin:  Negative for color change, rash and wound.  Neurological:  Negative for dizziness, weakness and headaches.  Psychiatric/Behavioral:  Negative for behavioral problems. The patient is not nervous/anxious.     Health Maintenance  Topic Date Due   DTaP/Tdap/Td (1 - Tdap) Never done   Cervical Cancer Screening (HPV/Pap Cotest)  Never done   COVID-19 Vaccine (3 - 2024-25 season) 01/30/2023   INFLUENZA VACCINE  Completed   Hepatitis C Screening  Completed   HIV Screening  Completed   HPV VACCINES  Aged Out    Physical Exam: Vitals:   05/12/23 0906  BP: 127/78  Pulse: 69  Resp: 18  Temp: 98.4 F (36.9 C)  SpO2: 96%  Weight: 257 lb 9.6 oz (116.8 kg)  Height: 5\' 5"  (1.651 m)   Body mass index is 42.87 kg/m. Physical Exam Constitutional:      Appearance: She is obese.     Comments: Morbidly obese  HENT:     Head: Normocephalic and atraumatic.     Nose: Nose normal.     Mouth/Throat:     Mouth: Mucous membranes are moist.  Eyes:     Conjunctiva/sclera: Conjunctivae normal.  Cardiovascular:     Rate and Rhythm:  Normal rate and regular rhythm.  Pulmonary:     Effort: Pulmonary effort is normal.     Breath sounds: Normal breath sounds.  Abdominal:     General: Bowel sounds are normal.     Palpations: Abdomen is soft.  Musculoskeletal:        General: Normal range of motion.     Cervical back: Normal range of motion.  Skin:    General: Skin is warm and dry.  Neurological:     General: No focal deficit present.     Mental Status: She is alert and oriented to person, place, and time.  Psychiatric:        Mood and Affect: Mood normal.        Behavior: Behavior normal.        Thought Content: Thought content normal.  Judgment: Judgment normal.    Labs reviewed: Basic Metabolic Panel: Recent Labs    12/14/22 0747  NA 138  K 4.4  CL 102  CO2 23  GLUCOSE 89  BUN 7  CREATININE 0.76  CALCIUM 9.5  TSH 2.150   Liver Function Tests: Recent Labs    12/14/22 0747  AST 17  ALT 22  ALKPHOS 78  BILITOT 0.5  PROT 7.3  ALBUMIN 4.2   No results for input(s): "LIPASE", "AMYLASE" in the last 8760 hours. No results for input(s): "AMMONIA" in the last 8760 hours. CBC: Recent Labs    12/14/22 0747  WBC 4.2  HGB 14.1  HCT 41.5  MCV 92  PLT 285   Lipid Panel: Recent Labs    12/14/22 0747  CHOL 218*  HDL 41  LDLCALC 165*  TRIG 70  CHOLHDL 5.3*   Lab Results  Component Value Date   HGBA1C 5.7 (H) 12/14/2022    Procedures since last visit: No results found.  Assessment/Plan  1. Encounter to establish care (Primary) -Established care with [facility]  2. Prediabetes -   Diet- controlled - CBC with Differential/Platelets - Complete Metabolic Panel with eGFR - Hemoglobin A1C  3. Hypercholesterolemia -   Not on statin - Lipid panel  4. Screening for cervical cancer Follows up with [facility] with last Pap smear on 1/24  5. Vitamin D deficiency -Continue vitamin D supplementation  6. Eczema, unspecified type - triamcinolone ointment (KENALOG) 0.1 %;  Apply 1 Application topically 2 (two) times daily as needed (rash flare). Do not use on the face, neck, underarms or groin area. Do not use more than 3 weeks in a row.  Dispense: 80 g; Refill: 2 - levocetirizine (XYZAL) 5 MG tablet; Take 1 tablet (5 mg total) by mouth daily as needed for allergies.  7. Mild depression -   PHQ-9 score 8, ranging as mild depression -    Declined psych consult and medication treatment  8. Morbidly obese (HCC) -    Counseled on diet and exercise -Plans to   lose 15 pounds in 3 months    Labs/tests ordered: CBC, CMP, lipid panel, A1c  Next appt:  Visit date not found

## 2023-05-13 LAB — COMPLETE METABOLIC PANEL WITH GFR
AG Ratio: 1.2 (calc) (ref 1.0–2.5)
ALT: 27 U/L (ref 6–29)
AST: 19 U/L (ref 10–30)
Albumin: 4.1 g/dL (ref 3.6–5.1)
Alkaline phosphatase (APISO): 70 U/L (ref 31–125)
BUN: 9 mg/dL (ref 7–25)
CO2: 28 mmol/L (ref 20–32)
Calcium: 9.6 mg/dL (ref 8.6–10.2)
Chloride: 103 mmol/L (ref 98–110)
Creat: 0.65 mg/dL (ref 0.50–0.97)
Globulin: 3.3 g/dL (ref 1.9–3.7)
Glucose, Bld: 85 mg/dL (ref 65–99)
Potassium: 4.2 mmol/L (ref 3.5–5.3)
Sodium: 138 mmol/L (ref 135–146)
Total Bilirubin: 0.5 mg/dL (ref 0.2–1.2)
Total Protein: 7.4 g/dL (ref 6.1–8.1)
eGFR: 121 mL/min/{1.73_m2} (ref >=60)

## 2023-05-13 LAB — CBC WITH DIFFERENTIAL/PLATELET
Absolute Lymphocytes: 2157 {cells}/uL (ref 850–3900)
Absolute Monocytes: 267 {cells}/uL (ref 200–950)
Basophils Absolute: 28 {cells}/uL (ref 0–200)
Basophils Relative: 0.6 %
Eosinophils Absolute: 290 {cells}/uL (ref 15–500)
Eosinophils Relative: 6.3 %
HCT: 39.8 % (ref 35.0–45.0)
Hemoglobin: 13.6 g/dL (ref 11.7–15.5)
MCH: 31.5 pg (ref 27.0–33.0)
MCHC: 34.2 g/dL (ref 32.0–36.0)
MCV: 92.1 fL (ref 80.0–100.0)
MPV: 10.6 fL (ref 7.5–12.5)
Monocytes Relative: 5.8 %
Neutro Abs: 1858 {cells}/uL (ref 1500–7800)
Neutrophils Relative %: 40.4 %
Platelets: 292 10*3/uL (ref 140–400)
RBC: 4.32 10*6/uL (ref 3.80–5.10)
RDW: 12.2 % (ref 11.0–15.0)
Total Lymphocyte: 46.9 %
WBC: 4.6 10*3/uL (ref 3.8–10.8)

## 2023-05-13 LAB — LIPID PANEL
Cholesterol: 213 mg/dL — ABNORMAL HIGH (ref <200)
HDL: 46 mg/dL — ABNORMAL LOW (ref >=50)
LDL Cholesterol (Calc): 152 mg/dL — ABNORMAL HIGH
Non-HDL Cholesterol (Calc): 167 mg/dL — ABNORMAL HIGH (ref <130)
Total CHOL/HDL Ratio: 4.6 (calc) (ref <5.0)
Triglycerides: 62 mg/dL (ref <150)

## 2023-05-13 LAB — HEMOGLOBIN A1C
Hgb A1c MFr Bld: 5.4 %{Hb} (ref <5.7)
Mean Plasma Glucose: 108 mg/dL
eAG (mmol/L): 6 mmol/L

## 2023-05-17 ENCOUNTER — Other Ambulatory Visit: Payer: Self-pay | Admitting: Adult Health

## 2023-05-17 DIAGNOSIS — E782 Mixed hyperlipidemia: Secondary | ICD-10-CM

## 2023-05-17 NOTE — Progress Notes (Signed)
Cholesterol 213, elevated -   LDL 152, elevated, lipid panel can be repeated in 3 months primary, will need to exercise at least 150 minutes/week, low-fat diet and weight loss -   No anemia -    Electrolytes, liver enzymes, A1C, and kidney function normal

## 2023-05-19 ENCOUNTER — Other Ambulatory Visit (HOSPITAL_COMMUNITY): Payer: Self-pay

## 2023-06-29 ENCOUNTER — Other Ambulatory Visit (HOSPITAL_COMMUNITY): Payer: Self-pay

## 2023-07-14 DIAGNOSIS — M9902 Segmental and somatic dysfunction of thoracic region: Secondary | ICD-10-CM | POA: Diagnosis not present

## 2023-07-14 DIAGNOSIS — M9901 Segmental and somatic dysfunction of cervical region: Secondary | ICD-10-CM | POA: Diagnosis not present

## 2023-07-14 DIAGNOSIS — M25552 Pain in left hip: Secondary | ICD-10-CM | POA: Diagnosis not present

## 2023-07-14 DIAGNOSIS — M25551 Pain in right hip: Secondary | ICD-10-CM | POA: Diagnosis not present

## 2023-07-14 DIAGNOSIS — M21752 Unequal limb length (acquired), left femur: Secondary | ICD-10-CM | POA: Diagnosis not present

## 2023-07-18 DIAGNOSIS — M9901 Segmental and somatic dysfunction of cervical region: Secondary | ICD-10-CM | POA: Diagnosis not present

## 2023-07-18 DIAGNOSIS — M9905 Segmental and somatic dysfunction of pelvic region: Secondary | ICD-10-CM | POA: Diagnosis not present

## 2023-07-18 DIAGNOSIS — M9902 Segmental and somatic dysfunction of thoracic region: Secondary | ICD-10-CM | POA: Diagnosis not present

## 2023-07-18 DIAGNOSIS — M9903 Segmental and somatic dysfunction of lumbar region: Secondary | ICD-10-CM | POA: Diagnosis not present

## 2023-07-21 DIAGNOSIS — M9905 Segmental and somatic dysfunction of pelvic region: Secondary | ICD-10-CM | POA: Diagnosis not present

## 2023-07-21 DIAGNOSIS — M9902 Segmental and somatic dysfunction of thoracic region: Secondary | ICD-10-CM | POA: Diagnosis not present

## 2023-07-21 DIAGNOSIS — M9901 Segmental and somatic dysfunction of cervical region: Secondary | ICD-10-CM | POA: Diagnosis not present

## 2023-07-21 DIAGNOSIS — M9903 Segmental and somatic dysfunction of lumbar region: Secondary | ICD-10-CM | POA: Diagnosis not present

## 2023-07-26 DIAGNOSIS — M9903 Segmental and somatic dysfunction of lumbar region: Secondary | ICD-10-CM | POA: Diagnosis not present

## 2023-07-26 DIAGNOSIS — M9902 Segmental and somatic dysfunction of thoracic region: Secondary | ICD-10-CM | POA: Diagnosis not present

## 2023-07-26 DIAGNOSIS — M9905 Segmental and somatic dysfunction of pelvic region: Secondary | ICD-10-CM | POA: Diagnosis not present

## 2023-07-26 DIAGNOSIS — M9901 Segmental and somatic dysfunction of cervical region: Secondary | ICD-10-CM | POA: Diagnosis not present

## 2023-07-28 DIAGNOSIS — M9902 Segmental and somatic dysfunction of thoracic region: Secondary | ICD-10-CM | POA: Diagnosis not present

## 2023-07-28 DIAGNOSIS — M9903 Segmental and somatic dysfunction of lumbar region: Secondary | ICD-10-CM | POA: Diagnosis not present

## 2023-07-28 DIAGNOSIS — M9905 Segmental and somatic dysfunction of pelvic region: Secondary | ICD-10-CM | POA: Diagnosis not present

## 2023-07-28 DIAGNOSIS — M9901 Segmental and somatic dysfunction of cervical region: Secondary | ICD-10-CM | POA: Diagnosis not present

## 2023-08-11 ENCOUNTER — Ambulatory Visit: Payer: 59 | Admitting: Adult Health

## 2023-08-18 ENCOUNTER — Other Ambulatory Visit (HOSPITAL_COMMUNITY): Payer: Self-pay

## 2023-08-18 ENCOUNTER — Ambulatory Visit: Payer: 59 | Admitting: Adult Health

## 2023-08-18 ENCOUNTER — Encounter: Payer: Self-pay | Admitting: Adult Health

## 2023-08-18 VITALS — BP 118/80 | HR 63 | Temp 97.7°F | Resp 21 | Ht 65.0 in | Wt 252.8 lb

## 2023-08-18 DIAGNOSIS — R29818 Other symptoms and signs involving the nervous system: Secondary | ICD-10-CM | POA: Diagnosis not present

## 2023-08-18 DIAGNOSIS — R0683 Snoring: Secondary | ICD-10-CM

## 2023-08-18 DIAGNOSIS — J302 Other seasonal allergic rhinitis: Secondary | ICD-10-CM

## 2023-08-18 DIAGNOSIS — E782 Mixed hyperlipidemia: Secondary | ICD-10-CM

## 2023-08-18 DIAGNOSIS — K5901 Slow transit constipation: Secondary | ICD-10-CM | POA: Diagnosis not present

## 2023-08-18 DIAGNOSIS — J3089 Other allergic rhinitis: Secondary | ICD-10-CM | POA: Diagnosis not present

## 2023-08-18 DIAGNOSIS — E559 Vitamin D deficiency, unspecified: Secondary | ICD-10-CM | POA: Diagnosis not present

## 2023-08-18 DIAGNOSIS — L309 Dermatitis, unspecified: Secondary | ICD-10-CM

## 2023-08-18 DIAGNOSIS — H1010 Acute atopic conjunctivitis, unspecified eye: Secondary | ICD-10-CM

## 2023-08-18 MED ORDER — LEVOCETIRIZINE DIHYDROCHLORIDE 5 MG PO TABS
5.0000 mg | ORAL_TABLET | Freq: Every day | ORAL | 3 refills | Status: AC | PRN
  Filled 2023-08-18: qty 30, 30d supply, fill #0

## 2023-08-18 NOTE — Progress Notes (Signed)
 [facility] clinic  Provider:  Kenard Gower DNP  Code Status:  Full Code  Goals of Care:     08/18/2023    9:20 AM  Advanced Directives  Does Patient Have a Medical Advance Directive? No  Would patient like information on creating a medical advance directive? No - Patient declined     Chief Complaint  Patient presents with   Follow-up    3 month follow up and fasting labs. Discuss the need for tdap and cervical cancer.  Discuss the need for tdap.   Discussed the use of AI scribe software for clinical note transcription with the patient, who gave verbal consent to proceed.  HPI: Patient is a 33 y.o. female seen today for a 47-month follow up of chronic medical issues.  She experiences constipation with bowel movements occurring less frequently than usual, describing her current pattern as 'lately, like one a day, not even really one.' She notes abdominal discomfort when she feels the need to defecate. Previously, she had more regular bowel movements, occurring twice a day. She has not been using any medications for constipation but is considering dietary changes to help alleviate her symptoms.  She is concerned about her weight, noting fluctuations between 249 and 257 pounds over the past year, currently weighing 253 pounds. She wants to lose weight and has been trying to incorporate more exercise into her routine. She is considering replacing meals with smoothies to aid in weight loss, mentioning that her schedule now allows more time for personal activities, including exercise. Her past medical history includes prediabetes with a last A1c of 5.4, indicating good blood sugar control, and mixed hyperlipidemia with an elevated cholesterol level of 230 mg/dL.  She reports experiencing hip pain on her left side, which was severe enough to cause difficulty walking and was associated with stiffness. She sought care from a chiropractor, who identified an alignment issue with her hip. Since  starting chiropractic treatment, she notes some improvement, although she still experiences some pain. She is able to walk more normally and has started to jog lightly.  She experiences sleep disturbances, including waking up frequently at night and feeling unrested, and has been told she snores.  She has a history of vitamin D deficiency, with her last level recorded at 36.1 ng/mL eight months ago.    Wt Readings from Last 3 Encounters:  08/16/23 252 lb 12.8 oz (114.7 kg)  05/12/23 257 lb 9.6 oz (116.8 kg)  01/11/23 249 lb (112.9 kg)      Past Medical History:  Diagnosis Date   Allergy    Anxiety    Back pain    Depression    Eczema    HSV (herpes simplex virus) anogenital infection    Hyperlipidemia    Lactose intolerance    Medical history non-contributory    Velamentous insertion of umbilical cord    Vitamin D deficiency     Past Surgical History:  Procedure Laterality Date   CESAREAN SECTION N/A 01/28/2017   Procedure: CESAREAN SECTION;  Surgeon: Myna Hidalgo, DO;  Location: [facility];  Service: Obstetrics;  Laterality: N/A;    Allergies  Allergen Reactions   Shellfish Allergy Nausea And Vomiting    Outpatient Encounter Medications as of 08/18/2023  Medication Sig   EPINEPHrine 0.3 mg/0.3 mL IJ SOAJ injection Inject 0.3 mg into the muscle as needed for anaphylaxis.   triamcinolone ointment (KENALOG) 0.1 % Apply 1 Application topically 2 (two) times daily as needed (rash flare). Do  not use on the face, neck, underarms or groin area. Do not use more than 3 weeks in a row.   valACYclovir (VALTREX) 1000 MG tablet valacyclovir 1 gram tablet   Vitamin D, Ergocalciferol, (DRISDOL) 1.25 MG (50000 UNIT) CAPS capsule Take 1 capsule (50,000 Units total) by mouth every 7 (seven) days.   cholecalciferol (VITAMIN D3) 25 MCG (1000 UNIT) tablet Take 1,000 Units by mouth daily. (Patient not taking: Reported on 08/18/2023)   levocetirizine (XYZAL) 5 MG tablet Take 1 tablet  (5 mg total) by mouth daily as needed for allergies.   [DISCONTINUED] levocetirizine (XYZAL) 5 MG tablet Take 1 tablet (5 mg total) by mouth daily as needed for allergies. (Patient not taking: Reported on 08/18/2023)   No facility-administered encounter medications on file as of 08/18/2023.    Review of Systems:  Review of Systems  Constitutional:  Negative for appetite change, chills, fatigue and fever.  HENT:  Negative for congestion, hearing loss, rhinorrhea and sore throat.   Eyes: Negative.   Respiratory:  Negative for cough, shortness of breath and wheezing.   Cardiovascular:  Negative for chest pain, palpitations and leg swelling.  Gastrointestinal:  Positive for constipation. Negative for abdominal pain, diarrhea, nausea and vomiting.  Genitourinary:  Negative for dysuria.  Musculoskeletal:  Negative for arthralgias, back pain and myalgias.  Skin:  Negative for color change, rash and wound.  Neurological:  Negative for dizziness, weakness and headaches.  Psychiatric/Behavioral:  Positive for sleep disturbance. Negative for behavioral problems. The patient is not nervous/anxious.     Health Maintenance  Topic Date Due   DTaP/Tdap/Td (1 - Tdap) Never done   Cervical Cancer Screening (HPV/Pap Cotest)  Never done   COVID-19 Vaccine (3 - 2024-25 season) 09/03/2023 (Originally 01/30/2023)   INFLUENZA VACCINE  Completed   Hepatitis C Screening  Completed   HIV Screening  Completed   HPV VACCINES  Aged Out    Physical Exam: Vitals:   08/16/23 1423  BP: 118/80  Pulse: 63  Resp: (!) 21  Temp: 97.7 F (36.5 C)  SpO2: 98%  Weight: 252 lb 12.8 oz (114.7 kg)  Height: 5\' 5"  (1.651 m)   Body mass index is 42.07 kg/m. Physical Exam Constitutional:      General: She is not in acute distress.    Appearance: She is obese.  HENT:     Head: Normocephalic and atraumatic.     Nose: Nose normal.     Mouth/Throat:     Mouth: Mucous membranes are moist.  Eyes:     Conjunctiva/sclera:  Conjunctivae normal.  Cardiovascular:     Rate and Rhythm: Normal rate and regular rhythm.  Pulmonary:     Effort: Pulmonary effort is normal.     Breath sounds: Normal breath sounds.  Abdominal:     General: Bowel sounds are normal.     Palpations: Abdomen is soft.  Musculoskeletal:        General: Normal range of motion.     Cervical back: Normal range of motion.  Skin:    General: Skin is warm and dry.  Neurological:     General: No focal deficit present.     Mental Status: She is alert and oriented to person, place, and time.  Psychiatric:        Mood and Affect: Mood normal.        Behavior: Behavior normal.        Thought Content: Thought content normal.        Judgment:  Judgment normal.     Labs reviewed: Basic Metabolic Panel: Recent Labs    12/14/22 0747 05/12/23 1000  NA 138 138  K 4.4 4.2  CL 102 103  CO2 23 28  GLUCOSE 89 85  BUN 7 9  CREATININE 0.76 0.65  CALCIUM 9.5 9.6  TSH 2.150  --    Liver Function Tests: Recent Labs    12/14/22 0747 05/12/23 1000  AST 17 19  ALT 22 27  ALKPHOS 78  --   BILITOT 0.5 0.5  PROT 7.3 7.4  ALBUMIN 4.2  --    No results for input(s): "LIPASE", "AMYLASE" in the last 8760 hours. No results for input(s): "AMMONIA" in the last 8760 hours. CBC: Recent Labs    12/14/22 0747 05/12/23 1000  WBC 4.2 4.6  NEUTROABS  --  1,858  HGB 14.1 13.6  HCT 41.5 39.8  MCV 92 92.1  PLT 285 292   Lipid Panel: Recent Labs    12/14/22 0747 05/12/23 1000 08/18/23 0954  CHOL 218* 213* 193  HDL 41 46* 47*  LDLCALC 165* 152* 132*  TRIG 70 62 46  CHOLHDL 5.3* 4.6 4.1   Lab Results  Component Value Date   HGBA1C 5.4 05/12/2023    Procedures since last visit: No results found.  Assessment/Plan  1. Slow transit constipation (Primary) -  Riham prefers natural remedies and dietary changes over medications. - Recommend kale or spinach smoothies to aid bowel movement. - Advise replacing one meal per day with a smoothie  for weight loss and bowel regularity. - Suggest magnesium gummies, ensuring daily intake of at least 400 mg.  2. Morbidly obese (HCC) -  counseled on diet and exercise -  Encourage regular exercise and dietary modifications for weight management.  3. Mixed hyperlipidemia -  Cholesterol level elevated at 230 mg/dL. Hindy interested in dietary changes. - Recommend kale or spinach smoothies to lower cholesterol. - Encourage weight loss through dietary changes, including meal replacement with smoothies. - Lipid panel  4. Vitamin D deficiency -  Vitamin D level was 36.1 ng/mL eight months ago. - Check vitamin D level today. - Vitamin D, 25-hydroxy  5. Seasonal and perennial allergic rhinoconjunctivitis -  will start on Xyzal - levocetirizine (XYZAL) 5 MG tablet; Take 1 tablet (5 mg total) by mouth daily as needed for allergies.  Dispense: 30 tablet; Refill: 3  6. Suspected sleep apnea -  reports snoring and frequent awakenings. Sleep study considered to evaluate for sleep apnea. - Refer for a sleep study. - Ambulatory referral to Sleep Studies       Labs/tests ordered:  lipid panel and vitamin D level   Return in about 6 months (around 02/18/2024).  Kenard Gower, NP

## 2023-08-19 LAB — LIPID PANEL
Cholesterol: 193 mg/dL (ref <200)
HDL: 47 mg/dL — ABNORMAL LOW (ref >=50)
LDL Cholesterol (Calc): 132 mg/dL — ABNORMAL HIGH
Non-HDL Cholesterol (Calc): 146 mg/dL — ABNORMAL HIGH (ref <130)
Total CHOL/HDL Ratio: 4.1 (calc) (ref <5.0)
Triglycerides: 46 mg/dL (ref <150)

## 2023-08-19 LAB — VITAMIN D 25 HYDROXY (VIT D DEFICIENCY, FRACTURES): Vit D, 25-Hydroxy: 25 ng/mL — ABNORMAL LOW (ref 30–100)

## 2023-08-21 MED ORDER — VITAMIN D (ERGOCALCIFEROL) 1.25 MG (50000 UNIT) PO CAPS
50000.0000 [IU] | ORAL_CAPSULE | ORAL | 3 refills | Status: DC
  Filled 2023-08-21: qty 12, 84d supply, fill #0

## 2023-08-21 NOTE — Progress Notes (Signed)
-    vitamin D level 25, down from 36.1, continue Vitamin D 50,000 units weekly -  cholesterol and triglycerides normal -  LDL 132, elevated but down from 152 (3 months ago), exercise at least 150 minutes/week and low fat diet

## 2023-08-22 ENCOUNTER — Other Ambulatory Visit (HOSPITAL_COMMUNITY): Payer: Self-pay

## 2023-08-31 ENCOUNTER — Other Ambulatory Visit (HOSPITAL_COMMUNITY): Payer: Self-pay

## 2023-09-19 ENCOUNTER — Institutional Professional Consult (permissible substitution): Admitting: Neurology

## 2023-10-06 ENCOUNTER — Other Ambulatory Visit (HOSPITAL_COMMUNITY): Payer: Self-pay

## 2023-10-06 DIAGNOSIS — Z01419 Encounter for gynecological examination (general) (routine) without abnormal findings: Secondary | ICD-10-CM | POA: Diagnosis not present

## 2023-10-06 DIAGNOSIS — E559 Vitamin D deficiency, unspecified: Secondary | ICD-10-CM | POA: Diagnosis not present

## 2023-10-06 DIAGNOSIS — Z113 Encounter for screening for infections with a predominantly sexual mode of transmission: Secondary | ICD-10-CM | POA: Diagnosis not present

## 2023-10-06 DIAGNOSIS — Z124 Encounter for screening for malignant neoplasm of cervix: Secondary | ICD-10-CM | POA: Diagnosis not present

## 2023-10-06 MED ORDER — CHOLECALCIFEROL 1.25 MG (50000 UT) PO CAPS
50000.0000 [IU] | ORAL_CAPSULE | ORAL | 0 refills | Status: DC
  Filled 2023-10-06: qty 8, 56d supply, fill #0

## 2023-10-10 ENCOUNTER — Other Ambulatory Visit (HOSPITAL_COMMUNITY): Payer: Self-pay

## 2023-10-10 ENCOUNTER — Ambulatory Visit
Admission: RE | Admit: 2023-10-10 | Discharge: 2023-10-10 | Disposition: A | Discharge status: Home / Self Care | Source: Ambulatory Visit | Attending: Adult Health | Admitting: Adult Health

## 2023-10-10 ENCOUNTER — Encounter: Payer: Self-pay | Admitting: Adult Health

## 2023-10-10 ENCOUNTER — Ambulatory Visit: Payer: Self-pay

## 2023-10-10 ENCOUNTER — Ambulatory Visit: Admitting: Adult Health

## 2023-10-10 VITALS — BP 127/78 | HR 66 | Temp 97.4°F | Resp 18 | Ht 65.0 in | Wt 256.4 lb

## 2023-10-10 DIAGNOSIS — E559 Vitamin D deficiency, unspecified: Secondary | ICD-10-CM

## 2023-10-10 DIAGNOSIS — R051 Acute cough: Secondary | ICD-10-CM

## 2023-10-10 DIAGNOSIS — E782 Mixed hyperlipidemia: Secondary | ICD-10-CM

## 2023-10-10 DIAGNOSIS — R079 Chest pain, unspecified: Secondary | ICD-10-CM | POA: Diagnosis not present

## 2023-10-10 LAB — POC COVID19 BINAXNOW: SARS Coronavirus 2 Ag: NEGATIVE

## 2023-10-10 MED ORDER — ZINC GLUCONATE 50 MG PO TABS
50.0000 mg | ORAL_TABLET | Freq: Every day | ORAL | Status: AC
Start: 2023-10-10 — End: 2023-10-20

## 2023-10-10 MED ORDER — BENZONATATE 100 MG PO CAPS
100.0000 mg | ORAL_CAPSULE | Freq: Three times a day (TID) | ORAL | 0 refills | Status: DC | PRN
  Filled 2023-10-10: qty 20, 7d supply, fill #0

## 2023-10-10 MED ORDER — ZINC GLUCONATE 50 MG PO TABS
50.0000 mg | ORAL_TABLET | Freq: Every day | ORAL | 0 refills | Status: DC
  Filled 2023-10-10: qty 10, 10d supply, fill #0

## 2023-10-10 MED ORDER — VITAMIN C 1000 MG PO TABS: 1000.0000 mg | ORAL_TABLET | Freq: Every day | ORAL | Status: AC

## 2023-10-10 NOTE — Telephone Encounter (Signed)
 Appointment scheduled today with Brooke Casey for evaluation.

## 2023-10-10 NOTE — Progress Notes (Unsigned)
 [facility] clinic  Provider:  Inge Mangle DNP  Code Status:  Full Code  Goals of Care:     08/18/2023    9:20 AM  Advanced Directives  Does Patient Have a Medical Advance Directive? No  Would patient like information on creating a medical advance directive? No - Patient declined     Chief Complaint  Patient presents with   Chest Pain    Left sided chest pain, intermittent for a couple of months    HPI: Patient is a 33 y.o. female seen today for an acute visit for Discussed the use of AI scribe software for clinical note transcription with the patient, who gave verbal consent to proceed.      Past Medical History:  Diagnosis Date   Allergy    Anxiety    Back pain    Depression    Eczema    HSV (herpes simplex virus) anogenital infection    Hyperlipidemia    Lactose intolerance    Medical history non-contributory    Velamentous insertion of umbilical cord    Vitamin D  deficiency     Past Surgical History:  Procedure Laterality Date   CESAREAN SECTION N/A 01/28/2017   Procedure: CESAREAN SECTION;  Surgeon: Ozan, Jennifer, DO;  [facility]: [facility];  Service: Obstetrics;  Laterality: N/A;    Allergies  Allergen Reactions   Shellfish Allergy Nausea And Vomiting    Outpatient Encounter Medications as of 10/10/2023  Medication Sig   Cholecalciferol  1.25 MG (50000 UT) capsule Take 1 capsule (50,000 Units total) by mouth once a week.   EPINEPHrine  0.3 mg/0.3 mL IJ SOAJ injection Inject 0.3 mg into the muscle as needed for anaphylaxis.   levocetirizine (XYZAL ) 5 MG tablet Take 1 tablet (5 mg total) by mouth daily as needed for allergies.   triamcinolone  ointment (KENALOG ) 0.1 % Apply 1 Application topically 2 (two) times daily as needed (rash flare). Do not use on the face, neck, underarms or groin area. Do not use more than 3 weeks in a row.   valACYclovir (VALTREX) 1000 MG tablet valacyclovir 1 gram tablet   Vitamin D , Ergocalciferol , (DRISDOL ) 1.25  MG (50000 UNIT) CAPS capsule Take 1 capsule (50,000 Units total) by mouth every 7 (seven) days.   No facility-administered encounter medications on file as of 10/10/2023.    Review of Systems:  Review of Systems  Constitutional:  Negative for appetite change, chills, fatigue and fever.  HENT:  Negative for congestion, hearing loss, rhinorrhea and sore throat.   Eyes: Negative.   Respiratory:  Positive for cough, shortness of breath and wheezing.   Cardiovascular:  Negative for chest pain, palpitations and leg swelling.  Gastrointestinal:  Negative for abdominal pain, constipation, diarrhea, nausea and vomiting.  Genitourinary:  Negative for dysuria.  Musculoskeletal:  Negative for arthralgias, back pain and myalgias.  Skin:  Negative for color change, rash and wound.  Neurological:  Negative for dizziness, weakness and headaches.  Psychiatric/Behavioral:  Negative for behavioral problems. The patient is not nervous/anxious.     Health Maintenance  Topic Date Due   Cervical Cancer Screening (HPV/Pap Cotest)  Never done   COVID-19 Vaccine (3 - 2024-25 season) 01/30/2023   INFLUENZA VACCINE  12/30/2023   DTaP/Tdap/Td (3 - Td or Tdap) 06/08/2028   Hepatitis C Screening  Completed   HIV Screening  Completed   HPV VACCINES  Aged Out   Meningococcal B Vaccine  Aged Out    Physical Exam: Vitals:   10/10/23 1431  BP: 127/78  Pulse: 66  Resp: 18  Temp: (!) 97.4 F (36.3 C)  SpO2: 91%  Weight: 256 lb 6.4 oz (116.3 kg)  Height: 5\' 5"  (1.651 m)   Body mass index is 42.67 kg/m. Physical Exam Constitutional:      Appearance: Normal appearance. She is obese.  HENT:     Head: Normocephalic and atraumatic.     Nose: Nose normal.     Mouth/Throat:     Mouth: Mucous membranes are moist.  Eyes:     Conjunctiva/sclera: Conjunctivae normal.  Cardiovascular:     Rate and Rhythm: Normal rate and regular rhythm.  Pulmonary:     Effort: Pulmonary effort is normal.     Breath sounds:  Examination of the left-upper field reveals decreased breath sounds and rales. Decreased breath sounds and rales present.  Abdominal:     General: Bowel sounds are normal.     Palpations: Abdomen is soft.  Musculoskeletal:        General: Normal range of motion.     Cervical back: Normal range of motion.  Skin:    General: Skin is warm and dry.  Neurological:     General: No focal deficit present.     Mental Status: She is alert and oriented to person, place, and time.  Psychiatric:        Mood and Affect: Mood normal.        Behavior: Behavior normal.        Thought Content: Thought content normal.        Judgment: Judgment normal.     Labs reviewed: Basic Metabolic Panel: Recent Labs    12/14/22 0747 05/12/23 1000  NA 138 138  K 4.4 4.2  CL 102 103  CO2 23 28  GLUCOSE 89 85  BUN 7 9  CREATININE 0.76 0.65  CALCIUM  9.5 9.6  TSH 2.150  --    Liver Function Tests: Recent Labs    12/14/22 0747 05/12/23 1000  AST 17 19  ALT 22 27  ALKPHOS 78  --   BILITOT 0.5 0.5  PROT 7.3 7.4  ALBUMIN 4.2  --    No results for input(s): "LIPASE", "AMYLASE" in the last 8760 hours. No results for input(s): "AMMONIA" in the last 8760 hours. CBC: Recent Labs    12/14/22 0747 05/12/23 1000  WBC 4.2 4.6  NEUTROABS  --  1,858  HGB 14.1 13.6  HCT 41.5 39.8  MCV 92 92.1  PLT 285 292   Lipid Panel: Recent Labs    12/14/22 0747 05/12/23 1000 08/18/23 0954  CHOL 218* 213* 193  HDL 41 46* 47*  LDLCALC 165* 152* 132*  TRIG 70 62 46  CHOLHDL 5.3* 4.6 4.1   Lab Results  Component Value Date   HGBA1C 5.4 05/12/2023    Procedures since last visit: No results found.  Assessment/Plan     Labs/tests ordered:   CBC, lipid panel, chest x-ray, COVID-19   No follow-ups on file.  Brooke Ariola Medina-Vargas, NP

## 2023-10-10 NOTE — Telephone Encounter (Signed)
 Copied from CRM [id number]. Topic: Clinical - Red Word Triage >> Oct 10, 2023 11:19 AM Danelle Dunning F wrote: Reason for CRM:   Patient called in stating that she can't breathe all the way in on her left side sometimes; feels like a cramp in her chest on left side (painful)   Chief Complaint: Chest pain Symptoms: Left sided chest pain  Frequency: Intermittent  Disposition: [] ED /[] Urgent Care (no appt availability in office) / [x] Appointment(In office/virtual)/ []  [facility]/ [] Home Care/ [] Refused Recommended Disposition /[] [facility]/ []  Follow-up with PCP Additional Notes: Patient reports that for the last couple of months she has had intermittent chest pains on her left side. She states that her pain lasts less than a minute when it is present and causes her difficulty taking a deep breath. She states she is also experiencing some congestion at this time but denies any other symptoms associated with her pain. Appointment made for today for evaluation and treatment of her symptoms.     Reason for Disposition  [1] Chest pain lasts < 5 minutes AND [2] NO chest pain or cardiac symptoms (e.g., breathing difficulty, sweating) now  (Exception: Chest pains that last only a few seconds.)  Answer Assessment - Initial Assessment Questions 1. LOCATION: "Where does it hurt?"       Left sided chest  2. RADIATION: "Does the pain go anywhere else?" (e.g., into neck, jaw, arms, back)     No radiation  3. ONSET: "When did the chest pain begin?" (Minutes, hours or days)      A couple of months  4. PATTERN: "Does the pain come and go, or has it been constant since it started?"  "Does it get worse with exertion?"      Intermittent  5. DURATION: "How long does it last" (e.g., seconds, minutes, hours)     Less than a minute  6. SEVERITY: "How bad is the pain?"  (e.g., Scale 1-10; mild, moderate, or severe)    - MILD (1-3): doesn't interfere with normal activities     - MODERATE (4-7):  interferes with normal activities or awakens from sleep    - SEVERE (8-10): excruciating pain, unable to do any normal activities       6/10 7. CARDIAC RISK FACTORS: "Do you have any history of heart problems or risk factors for heart disease?" (e.g., angina, prior heart attack; diabetes, high blood pressure, high cholesterol, smoker, or strong family history of heart disease)     No 8. PULMONARY RISK FACTORS: "Do you have any history of lung disease?"  (e.g., blood clots in lung, asthma, emphysema, birth control pills)     No 9. CAUSE: "What do you think is causing the chest pain?"     Unsure  10. OTHER SYMPTOMS: "Do you have any other symptoms?" (e.g., dizziness, nausea, vomiting, sweating, fever, difficulty breathing, cough)       Congestion  11. PREGNANCY: "Is there any chance you are pregnant?" "When was your last menstrual period?"       No  Protocols used: Chest Pain-A-AH

## 2023-10-11 LAB — CBC WITH DIFFERENTIAL/PLATELET
Absolute Lymphocytes: 2672 {cells}/uL (ref 850–3900)
Absolute Monocytes: 451 {cells}/uL (ref 200–950)
Basophils Absolute: 43 {cells}/uL (ref 0–200)
Basophils Relative: 0.7 %
Eosinophils Absolute: 366 {cells}/uL (ref 15–500)
Eosinophils Relative: 6 %
HCT: 40 % (ref 35.0–45.0)
Hemoglobin: 13.8 g/dL (ref 11.7–15.5)
MCH: 31.3 pg (ref 27.0–33.0)
MCHC: 34.5 g/dL (ref 32.0–36.0)
MCV: 90.7 fL (ref 80.0–100.0)
MPV: 10.1 fL (ref 7.5–12.5)
Monocytes Relative: 7.4 %
Neutro Abs: 2568 {cells}/uL (ref 1500–7800)
Neutrophils Relative %: 42.1 %
Platelets: 313 10*3/uL (ref 140–400)
RBC: 4.41 10*6/uL (ref 3.80–5.10)
RDW: 12.8 % (ref 11.0–15.0)
Total Lymphocyte: 43.8 %
WBC: 6.1 10*3/uL (ref 3.8–10.8)

## 2023-10-11 LAB — LIPID PANEL
Cholesterol: 222 mg/dL — ABNORMAL HIGH (ref <200)
HDL: 54 mg/dL (ref >=50)
LDL Cholesterol (Calc): 150 mg/dL — ABNORMAL HIGH
Non-HDL Cholesterol (Calc): 168 mg/dL — ABNORMAL HIGH (ref <130)
Total CHOL/HDL Ratio: 4.1 (calc) (ref <5.0)
Triglycerides: 77 mg/dL (ref <150)

## 2023-10-13 ENCOUNTER — Institutional Professional Consult (permissible substitution): Admitting: Neurology

## 2023-10-14 ENCOUNTER — Other Ambulatory Visit: Payer: Self-pay | Admitting: Adult Health

## 2023-10-14 ENCOUNTER — Ambulatory Visit: Payer: Self-pay | Admitting: Adult Health

## 2023-10-14 DIAGNOSIS — E782 Mixed hyperlipidemia: Secondary | ICD-10-CM

## 2023-10-14 MED ORDER — ATORVASTATIN CALCIUM 20 MG PO TABS: 20.0000 mg | ORAL_TABLET | Freq: Every day | ORAL | 3 refills | Status: AC

## 2023-10-14 NOTE — Progress Notes (Signed)
-   chest x-ray negative for acute disease -  will start on Atorvastatin 20 mg daily and repeat lipid panel in 3 months

## 2023-10-14 NOTE — Progress Notes (Signed)
 Wbc and hgb normal, no infection

## 2023-10-17 ENCOUNTER — Other Ambulatory Visit: Payer: Self-pay | Admitting: Adult Health

## 2023-10-17 ENCOUNTER — Other Ambulatory Visit (HOSPITAL_COMMUNITY): Payer: Self-pay

## 2023-10-17 DIAGNOSIS — R051 Acute cough: Secondary | ICD-10-CM

## 2023-10-17 MED ORDER — ALBUTEROL SULFATE HFA 108 (90 BASE) MCG/ACT IN AERS
2.0000 | INHALATION_SPRAY | Freq: Four times a day (QID) | RESPIRATORY_TRACT | 0 refills | Status: AC | PRN
  Filled 2023-10-17: qty 6.7, 30d supply, fill #0

## 2023-10-17 NOTE — Telephone Encounter (Signed)
-    Chest x-ray was negative for acute disease. Sent Albuterol  HFA to pharmacy for shortness of breath.

## 2023-10-27 ENCOUNTER — Other Ambulatory Visit (HOSPITAL_COMMUNITY): Payer: Self-pay

## 2024-01-16 ENCOUNTER — Encounter: Payer: Self-pay | Admitting: Adult Health

## 2024-01-16 ENCOUNTER — Ambulatory Visit: Payer: Self-pay | Admitting: Adult Health

## 2024-01-16 ENCOUNTER — Other Ambulatory Visit (HOSPITAL_COMMUNITY): Payer: Self-pay

## 2024-01-16 ENCOUNTER — Ambulatory Visit
Admission: RE | Admit: 2024-01-16 | Discharge: 2024-01-16 | Disposition: A | Discharge status: Home / Self Care | Source: Ambulatory Visit | Attending: Adult Health | Admitting: Adult Health

## 2024-01-16 ENCOUNTER — Ambulatory Visit: Admitting: Adult Health

## 2024-01-16 ENCOUNTER — Ambulatory Visit: Payer: Self-pay

## 2024-01-16 VITALS — BP 126/87 | HR 68 | Temp 97.6°F | Resp 18 | Ht 65.0 in | Wt 255.0 lb

## 2024-01-16 DIAGNOSIS — L309 Dermatitis, unspecified: Secondary | ICD-10-CM | POA: Diagnosis not present

## 2024-01-16 DIAGNOSIS — H1010 Acute atopic conjunctivitis, unspecified eye: Secondary | ICD-10-CM

## 2024-01-16 DIAGNOSIS — S99911A Unspecified injury of right ankle, initial encounter: Secondary | ICD-10-CM

## 2024-01-16 DIAGNOSIS — M79661 Pain in right lower leg: Secondary | ICD-10-CM | POA: Diagnosis not present

## 2024-01-16 DIAGNOSIS — E559 Vitamin D deficiency, unspecified: Secondary | ICD-10-CM

## 2024-01-16 DIAGNOSIS — R52 Pain, unspecified: Secondary | ICD-10-CM

## 2024-01-16 DIAGNOSIS — M25571 Pain in right ankle and joints of right foot: Secondary | ICD-10-CM | POA: Diagnosis not present

## 2024-01-16 DIAGNOSIS — E782 Mixed hyperlipidemia: Secondary | ICD-10-CM | POA: Diagnosis not present

## 2024-01-16 MED ORDER — TRIAMCINOLONE ACETONIDE 0.1 % EX OINT
1.0000 | TOPICAL_OINTMENT | Freq: Two times a day (BID) | CUTANEOUS | 2 refills | Status: AC | PRN
  Filled 2024-01-16: qty 80, 30d supply, fill #0

## 2024-01-16 MED ORDER — CHOLECALCIFEROL 1.25 MG (50000 UT) PO CAPS
50000.0000 [IU] | ORAL_CAPSULE | ORAL | 0 refills | Status: AC
  Filled 2024-01-16: qty 8, 56d supply, fill #0

## 2024-01-16 NOTE — Telephone Encounter (Signed)
 Appointment scheduled for 01/16/2024

## 2024-01-16 NOTE — Telephone Encounter (Signed)
 FYI Only or Action Required?: Action required by provider: request for appointment.  Patient was last seen in primary care on 10/10/2023 by Medina-Vargas, Jereld BROCKS, NP.  Called Nurse Triage reporting Ankle Injury.  Symptoms began yesterday.  Interventions attempted: Rest, hydration, or home remedies.  Symptoms are: unchanged.  Triage Disposition: See HCP Within 4 Hours (Or PCP Triage)  Patient/caregiver understands and will follow disposition?: YesCopied from CRM #[id number]. Topic: Clinical - Red Word Triage >> Jan 16, 2024  8:08 AM Zane F wrote: [facility] that prompted transfer to Nurse Triage:   Possible broken right ankle; can't walk;painful; swollen Reason for Disposition  [1] SEVERE pain (e.g., excruciating) AND [2] not improved 2 hours after pain medicine/ice packs  Answer Assessment - Initial Assessment Questions Pt didn't bend knees while jumping in pool and landed/rolled right ankle.      1. MECHANISM: How did the injury happen? (e.g., twisting injury, direct blow)      Jumping in pool 2. ONSET: When did the injury happen? (e.g., minutes or hours ago)      Yesterday 3. LOCATION: Where is the injury located?      Right foot  4. APPEARANCE of INJURY: What does the injury look like?      Swelling  5. WEIGHT-BEARING: Can you put weight on that foot? Can you walk (four steps or more)?       no 6. SIZE: For cuts, bruises, or swelling, ask: How large is it? (e.g., inches or centimeters; entire joint)      Mild swelling 7. PAIN: Is there pain? If Yes, ask: How bad is the pain?  What does it keep you from doing? (Scale 0-10; or none, mild, moderate, severe)     9  9. OTHER SYMPTOMS: Do you have any other symptoms?      denies  Protocols used: Ankle Injury-A-AH

## 2024-01-16 NOTE — Progress Notes (Signed)
 No acute fracture noted. Great! Take Tylenol  1000 mg Q 6 hours PRN for pain.

## 2024-01-16 NOTE — Progress Notes (Unsigned)
 [facility]  Provider:  Jereld Serum DNP  Code Status:  Full Code  Goals of Care:     08/18/2023    9:20 AM  Advanced Directives  Does Patient Have a Medical Advance Directive? No  Would patient like information on creating a medical advance directive? No - Patient declined     Chief Complaint  Patient presents with  . Ankle Injury    ankle injury    Discussed the use of AI scribe software for clinical note transcription with the patient, who gave verbal consent to proceed.  HPI: Patient is a 33 y.o. female seen today for an acute visit for ankle injury.  She experienced right ankle pain following a pool accident that occurred yesterday evening around 6 or 7 PM. She jumped into a pool that was not deep enough, resulting in an injury to her ankle. The pain is currently rated as an 8 out of 10, having been a 10 out of 10 initially. The pain is located mostly in the ankle and radiates up the leg. She has been keeping the ankle elevated and using ice to manage the swelling, which was significant yesterday but has reduced somewhat today.  She has been taking ibuprofen  400 mg every four hours for pain management, although she has not taken any since 2 AM today.  Her past medical history includes mixed hyperlipidemia and vitamin D  deficiency. She is not currently taking atorvastatin  for her hyperlipidemia. Her last cholesterol check showed a total cholesterol of 222 mg/dL and an LDL of 849 mg/dL. She takes vitamin D  50,000 units once a week, but she is unsure if she has been consistent with this regimen.  She has a history of eczema, which is currently flaring up on her feet, particularly during the summer months. She uses Kenalog  cream as needed for this condition.  She completed a 5K run in July and has been actively trying to lose weight, reporting a weight loss of about 10 pounds recently. She does not smoke and drinks alcohol occasionally. She has a known allergy to  shellfish, which causes nausea and vomiting.        Past Medical History:  Diagnosis Date  . Allergy   . Anxiety   . Back pain   . Depression   . Eczema   . HSV (herpes simplex virus) anogenital infection   . Hyperlipidemia   . Lactose intolerance   . Medical history non-contributory   . Velamentous insertion of umbilical cord   . Vitamin D  deficiency     Past Surgical History:  Procedure Laterality Date  . CESAREAN SECTION N/A 01/28/2017   Procedure: CESAREAN SECTION;  Surgeon: Ozan, Jennifer, DO;  Location: [facility];  Service: Obstetrics;  Laterality: N/A;    Allergies  Allergen Reactions  . Shellfish Allergy Nausea And Vomiting    Outpatient Encounter Medications as of 01/16/2024  Medication Sig  . valACYclovir (VALTREX) 1000 MG tablet valacyclovir 1 gram tablet (Patient taking differently: Take 1,000 mg by mouth as needed.)  . albuterol  (VENTOLIN  HFA) 108 (90 Base) MCG/ACT inhaler Inhale 2 puffs into the lungs every 6 (six) hours as needed for wheezing or shortness of breath.  . atorvastatin  (LIPITOR) 20 MG tablet Take 1 tablet (20 mg total) by mouth daily. (Patient not taking: Reported on 01/16/2024)  . benzonatate  (TESSALON  PERLES) 100 MG capsule Take 1 capsule (100 mg total) by mouth 3 (three) times daily as needed.  . Cholecalciferol  1.25 MG (50000 UT) capsule  Take 1 capsule (50,000 Units total) by mouth once a week.  . EPINEPHrine  0.3 mg/0.3 mL IJ SOAJ injection Inject 0.3 mg into the muscle as needed for anaphylaxis.  SABRA levocetirizine (XYZAL ) 5 MG tablet Take 1 tablet (5 mg total) by mouth daily as needed for allergies.  . triamcinolone  ointment (KENALOG ) 0.1 % Apply 1 Application topically 2 (two) times daily as needed (rash flare). Do not use on the face, neck, underarms or groin area. Do not use more than 3 weeks in a row.   No facility-administered encounter medications on file as of 01/16/2024.    Review of Systems:  Review of Systems   Constitutional:  Negative for appetite change, chills, fatigue and fever.  HENT:  Negative for congestion, hearing loss, rhinorrhea and sore throat.   Eyes: Negative.   Respiratory:  Negative for cough, shortness of breath and wheezing.   Cardiovascular:  Negative for chest pain, palpitations and leg swelling.  Gastrointestinal:  Negative for abdominal pain, constipation, diarrhea, nausea and vomiting.  Genitourinary:  Negative for dysuria.  Musculoskeletal:  Negative for arthralgias, back pain and myalgias.  Skin:  Negative for color change, rash and wound.  Neurological:  Negative for dizziness, weakness and headaches.  Psychiatric/Behavioral:  Negative for behavioral problems. The patient is not nervous/anxious.     Health Maintenance  Topic Date Due  . Hepatitis B Vaccines 19-59 Average Risk (1 of 3 - 19+ 3-dose series) Never done  . HPV VACCINES (1 - Risk 3-dose SCDM series) Never done  . Cervical Cancer Screening (HPV/Pap Cotest)  Never done  . COVID-19 Vaccine (3 - 2024-25 season) 01/30/2023  . INFLUENZA VACCINE  12/30/2023  . DTaP/Tdap/Td (3 - Td or Tdap) 06/08/2028  . Hepatitis C Screening  Completed  . HIV Screening  Completed  . Pneumococcal Vaccine  Aged Out  . Meningococcal B Vaccine  Aged Out    Physical Exam: Vitals:   01/16/24 0959  BP: 126/87  Pulse: 68  Resp: 18  Temp: 97.6 F (36.4 C)  SpO2: 97%  Weight: 255 lb (115.7 kg)  Height: 5' 5 (1.651 m)   Body mass index is 42.43 kg/m. Physical Exam Constitutional:      General: She is not in acute distress.    Appearance: She is obese.  HENT:     Head: Normocephalic and atraumatic.     Nose: Nose normal.     Mouth/Throat:     Mouth: Mucous membranes are moist.  Eyes:     Conjunctiva/sclera: Conjunctivae normal.  Cardiovascular:     Rate and Rhythm: Normal rate and regular rhythm.  Pulmonary:     Effort: Pulmonary effort is normal.     Breath sounds: Normal breath sounds.  Abdominal:      General: Bowel sounds are normal.     Palpations: Abdomen is soft.  Musculoskeletal:        General: Normal range of motion.     Cervical back: Normal range of motion.  Skin:    General: Skin is warm and dry.  Neurological:     General: No focal deficit present.     Mental Status: She is alert and oriented to person, place, and time.  Psychiatric:        Mood and Affect: Mood normal.        Behavior: Behavior normal.        Thought Content: Thought content normal.        Judgment: Judgment normal.     Labs  reviewed: Basic Metabolic Panel: Recent Labs    05/12/23 1000  NA 138  K 4.2  CL 103  CO2 28  GLUCOSE 85  BUN 9  CREATININE 0.65  CALCIUM  9.6   Liver Function Tests: Recent Labs    05/12/23 1000  AST 19  ALT 27  BILITOT 0.5  PROT 7.4   No results for input(s): LIPASE, AMYLASE in the last 8760 hours. No results for input(s): AMMONIA in the last 8760 hours. CBC: Recent Labs    05/12/23 1000 10/10/23 1506  WBC 4.6 6.1  NEUTROABS 1,858 2,568  HGB 13.6 13.8  HCT 39.8 40.0  MCV 92.1 90.7  PLT 292 313   Lipid Panel: Recent Labs    05/12/23 1000 08/18/23 0954 10/10/23 1506  CHOL 213* 193 222*  HDL 46* 47* 54  LDLCALC 152* 132* 150*  TRIG 62 46 77  CHOLHDL 4.6 4.1 4.1   Lab Results  Component Value Date   HGBA1C 5.4 05/12/2023    Procedures since last visit: No results found.  Assessment/Plan  Assessment and Plan    Right ankle and lower leg injury Acute injury with pain, swelling, and weight-bearing difficulty.  - Order x-ray of right ankle and lower leg. - Advise ibuprofen  400 mg every 4 hours with food. - Instruct to apply ice. - Advise leg elevation. - Consider orthopedic referral if fracture confirmed.  Eczema of feet Chronic eczema with current flare-up. - Prescribe triamcinolone  cream. - Advise to apply cream as needed.  Mixed hyperlipidemia Mixed hyperlipidemia with previous elevated cholesterol and LDL levels. -  Order repeat cholesterol check.  Vitamin D  deficiency Vitamin D  deficiency with previous low level and inconsistent supplementation. - Prescribe vitamin D  50,000 units once weekly. - Advise regular vitamin D  supplementation.        Labs/tests ordered:   lipid panel, vitamin d  level   Return if symptoms worsen or fail to improve.  Capri Veals Medina-Vargas, NP

## 2024-01-17 LAB — LIPID PANEL
Cholesterol: 181 mg/dL (ref <200)
HDL: 47 mg/dL — ABNORMAL LOW (ref >=50)
LDL Cholesterol (Calc): 116 mg/dL — ABNORMAL HIGH
Non-HDL Cholesterol (Calc): 134 mg/dL — ABNORMAL HIGH (ref <130)
Total CHOL/HDL Ratio: 3.9 (calc) (ref <5.0)
Triglycerides: 79 mg/dL (ref <150)

## 2024-01-17 LAB — VITAMIN D 25 HYDROXY (VIT D DEFICIENCY, FRACTURES): Vit D, 25-Hydroxy: 38 ng/mL (ref 30–100)

## 2024-01-22 IMAGING — MR MR PELVIS WO/W CM
7 of 9 series · 23 of 48 positions shown · IV contrast (multihance)
Comparison: None.

CLINICAL DATA: Evaluate for possible urethral diverticulum.

EXAM:
MRI PELVIS WITHOUT AND WITH CONTRAST
TECHNIQUE: Multiplanar multisequence MR imaging of the pelvis was performed
both before and after administration of intravenous contrast.
CONTRAST:  20mL MULTIHANCE GADOBENATE DIMEGLUMINE 529 MG/ML IV SOLN

[Series 2: T2 · axial · 5.0mm · 0.45mm/px · z∈[-93,+129]mm · 3 of 38 slices shown (1 of 2)]
[im 1/38]
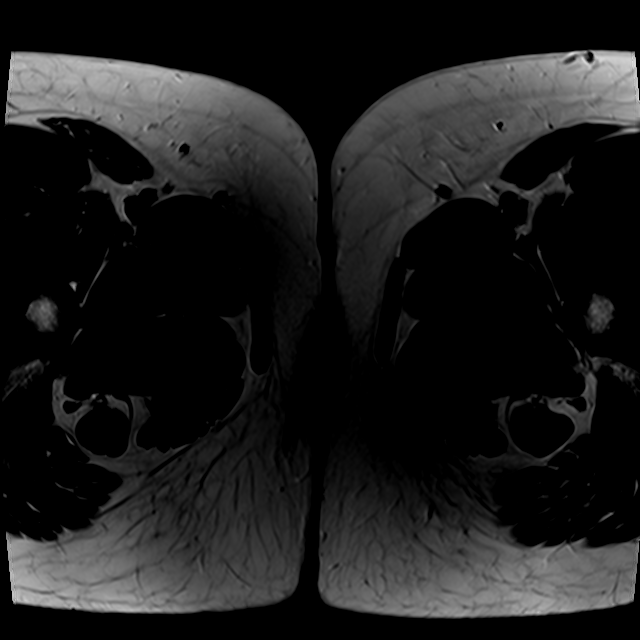
[im 19/38]
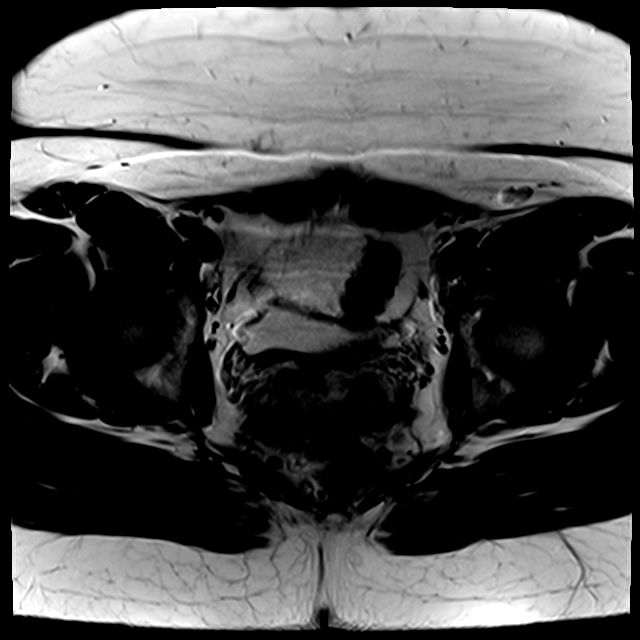
[im 38/38]
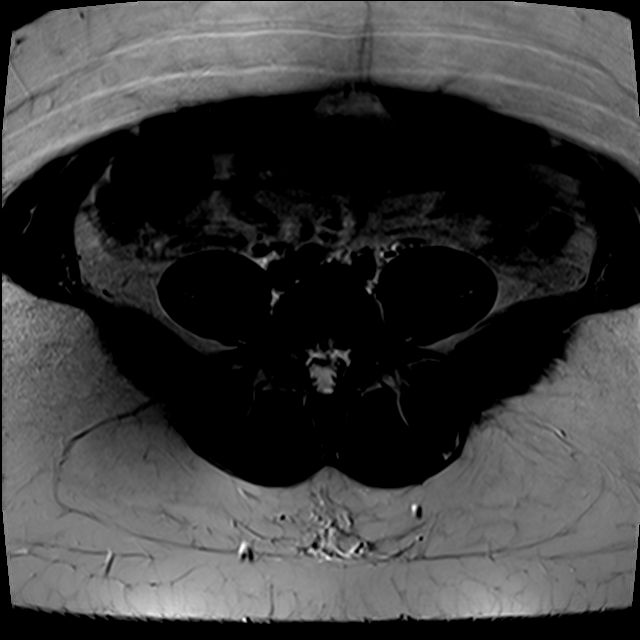

[Series 3: T2 · sagittal · 5.0mm · 0.68mm/px · 2 of 25 slices shown (2 of 2)]
[im 1/25]
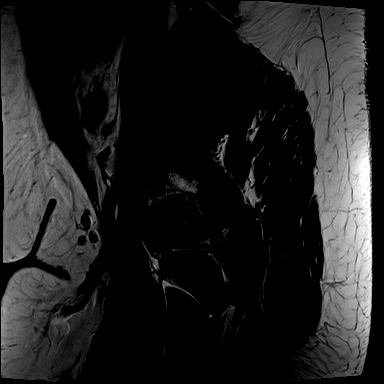
[im 25/25]
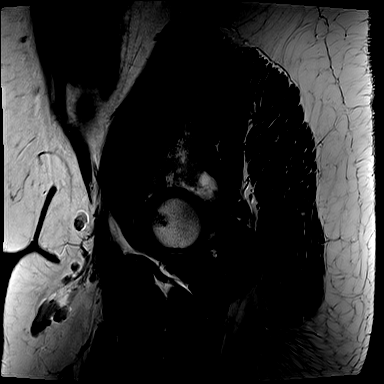

[Series 4: T2 fat-sat · axial · 3.0mm · 0.35mm/px · z∈[-98,+10]mm · 2 of 31 slices shown (1 of 3)]
[im 1/31]
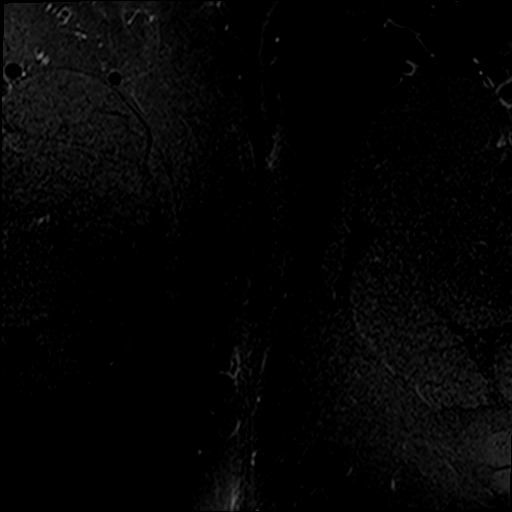
[im 31/31]
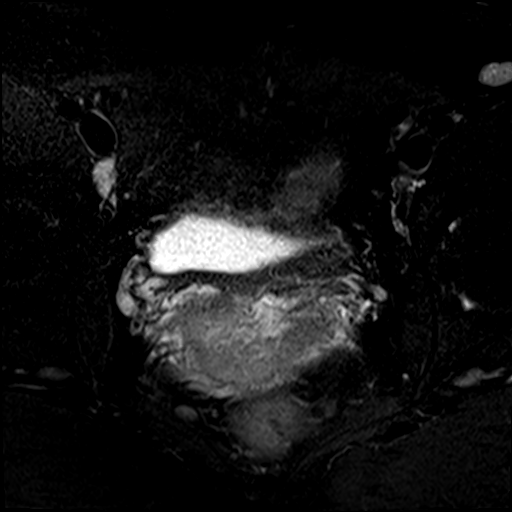

[Series 5: T2 fat-sat · coronal · 3.0mm · 0.35mm/px · 3 of 37 slices shown (2 of 3)]
[im 1/37]
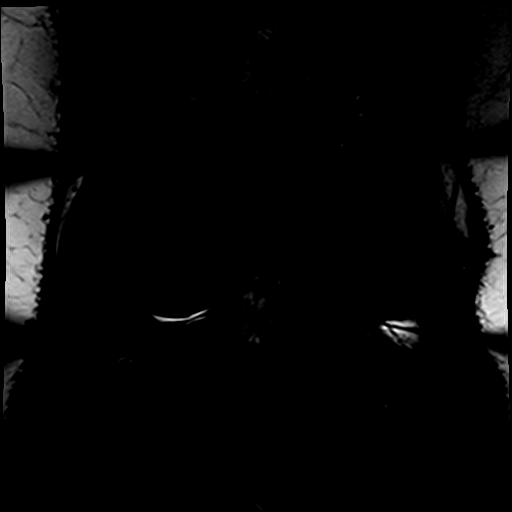
[im 19/37]
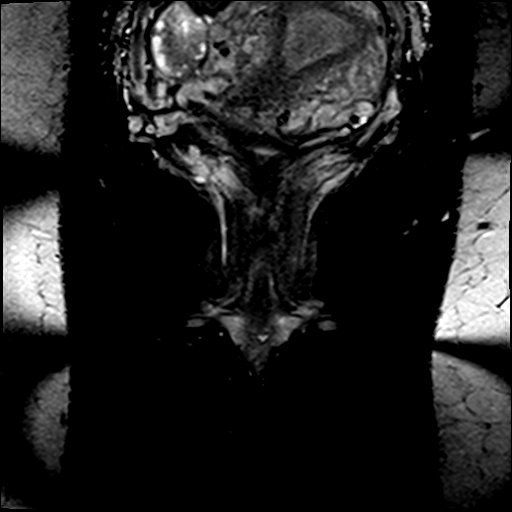
[im 37/37]
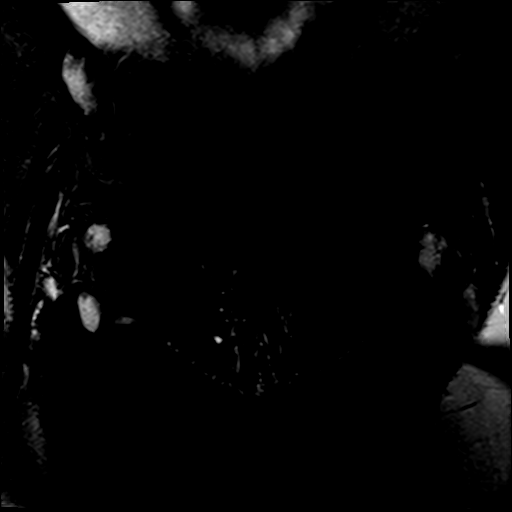

[Series 6: T2 fat-sat · sagittal · 3.0mm · 0.35mm/px · 2 of 25 slices shown (3 of 3)]
[im 1/25]
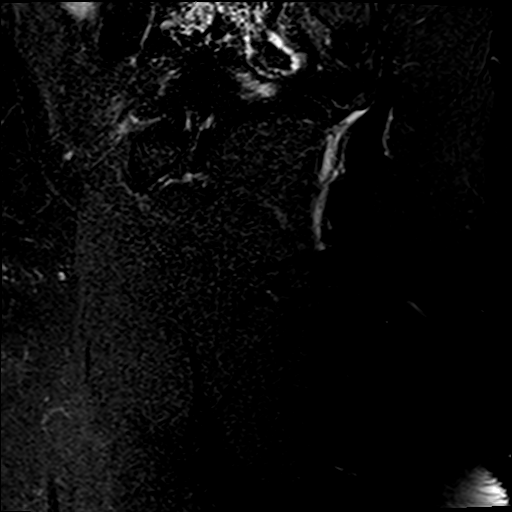
[im 25/25]
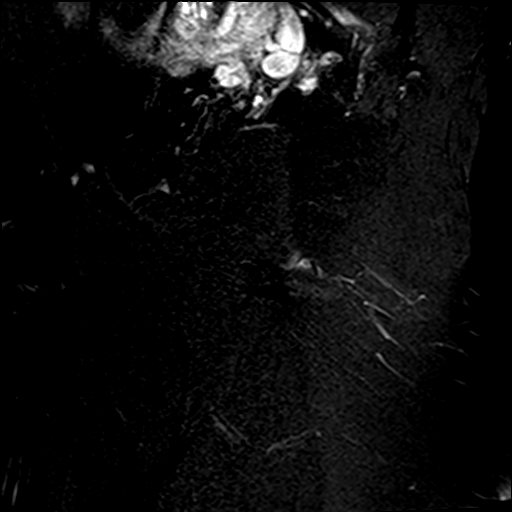

[Series 7: T1 · axial · 1.2mm · 0.54mm/px · z∈[-85,+106]mm · 8 of 160 slices shown]
[im 1/160]
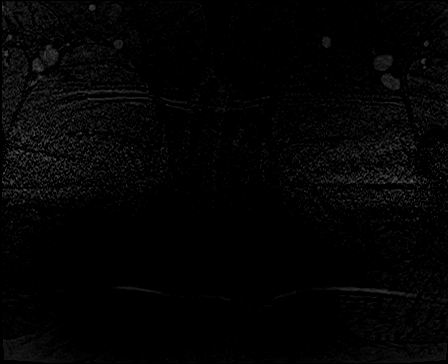
[im 32/160]
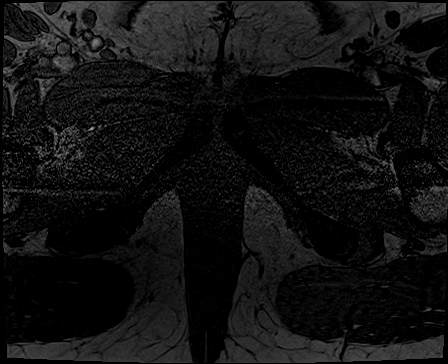
[im 48/160]
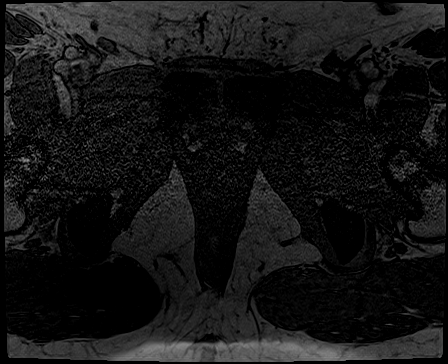
[im 64/160]
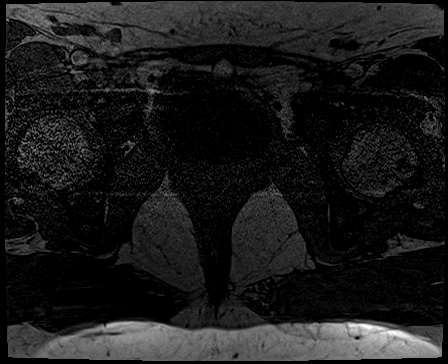
[im 96/160]
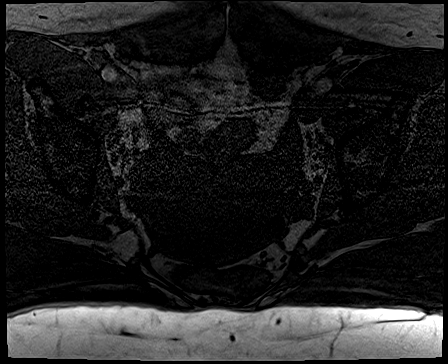
[im 112/160]
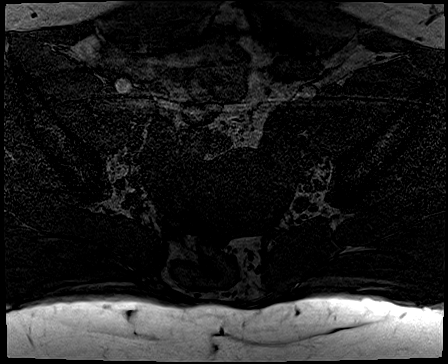
[im 128/160]
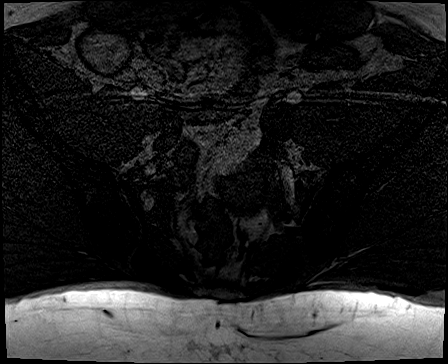
[im 160/160]
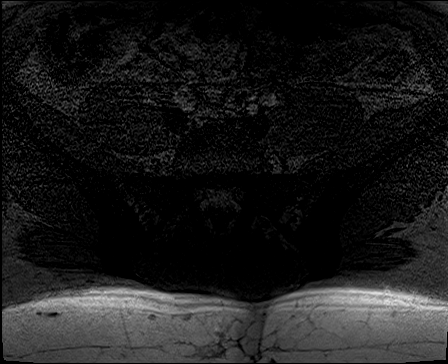

[Series 8: T1 fat-sat · axial · 1.2mm · 0.54mm/px · z∈[-75,-19]mm · 3 of 144 slices shown]
[im 1/144]
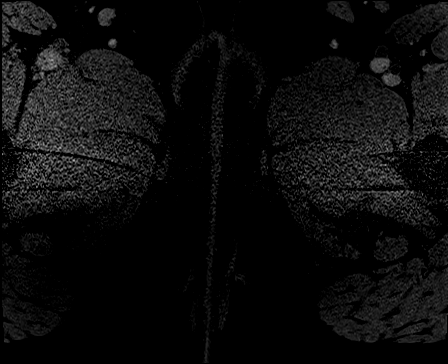
[im 16/144]
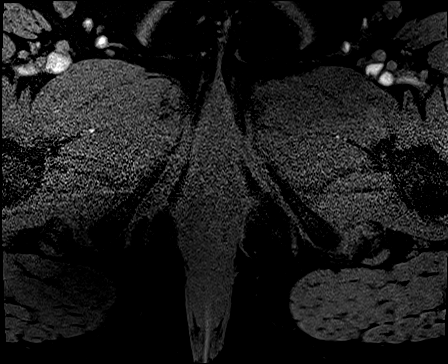
[im 48/144]
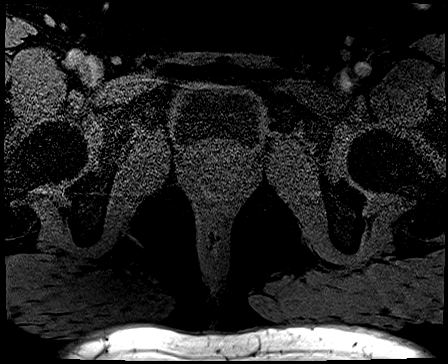

[23 of 48 positions shown; findings below may reference images not displayed]

FINDINGS: Urinary Tract: Urinary bladder is unremarkable for degree of
distension.

Bowel:  Unremarkable visualized pelvic bowel loops.

Vascular/Lymphatic: No pathologically enlarged lymph nodes. No
significant vascular abnormality seen.

Reproductive: There are 2 contiguous fluid density lesions located
along the anterior vaginal wall at the level of the pubic symphysis
measuring 14 x 10 x 12 mm on image [DATE] and [DATE] and 9 x 8 x 8 mm on
image [DATE] and [DATE], which appear discrete from the urethra.

Uterus is anteflexed and retroverted. Endometrium measures 2 mm. The
junctional zone is prominent with a focal area of thickening
measuring 13 mm on image [DATE].

No suspicious adnexal mass.

Other:  No significant pelvic free fluid.

Musculoskeletal: No suspicious bone lesions identified.
IMPRESSION: 1. Two contiguous fluid density lesions located along the vaginal
wall near the level of the pubic symphysis measuring up to 14 mm
which appear discrete from the urethra and are most consistent with
[place] cysts or less likely Bartholin gland cysts.
2. Focal thickening of the junctional zone of the uterus is
suspicious for adenomyosis.

## 2024-02-23 ENCOUNTER — Ambulatory Visit: Admitting: Adult Health

## 2024-04-19 DIAGNOSIS — H5213 Myopia, bilateral: Secondary | ICD-10-CM | POA: Diagnosis not present

## 2024-12-13 ENCOUNTER — Encounter: Admitting: Adult Health
